# Patient Record
Sex: Female | Born: 1970 | Race: Black or African American | Hispanic: No | Marital: Single | Smoking: Never smoker
Health system: Southern US, Community
[De-identification: ages and names within clinical notes are randomized; demographics above are authoritative.]

## PROBLEM LIST (undated history)

## (undated) DIAGNOSIS — N920 Excessive and frequent menstruation with regular cycle: Secondary | ICD-10-CM

## (undated) DIAGNOSIS — F411 Generalized anxiety disorder: Secondary | ICD-10-CM

## (undated) DIAGNOSIS — R030 Elevated blood-pressure reading, without diagnosis of hypertension: Secondary | ICD-10-CM

## (undated) DIAGNOSIS — K219 Gastro-esophageal reflux disease without esophagitis: Secondary | ICD-10-CM

## (undated) DIAGNOSIS — Z86010 Personal history of colonic polyps: Secondary | ICD-10-CM

## (undated) DIAGNOSIS — R569 Unspecified convulsions: Secondary | ICD-10-CM

## (undated) DIAGNOSIS — N8000 Endometriosis of the uterus, unspecified: Secondary | ICD-10-CM

## (undated) DIAGNOSIS — N946 Dysmenorrhea, unspecified: Secondary | ICD-10-CM

## (undated) DIAGNOSIS — K589 Irritable bowel syndrome without diarrhea: Secondary | ICD-10-CM

## (undated) DIAGNOSIS — E785 Hyperlipidemia, unspecified: Secondary | ICD-10-CM

## (undated) DIAGNOSIS — J45909 Unspecified asthma, uncomplicated: Secondary | ICD-10-CM

## (undated) DIAGNOSIS — F329 Major depressive disorder, single episode, unspecified: Secondary | ICD-10-CM

## (undated) DIAGNOSIS — N8003 Adenomyosis of the uterus: Secondary | ICD-10-CM

## (undated) DIAGNOSIS — D649 Anemia, unspecified: Secondary | ICD-10-CM

## (undated) DIAGNOSIS — R480 Dyslexia and alexia: Secondary | ICD-10-CM

## (undated) DIAGNOSIS — G40909 Epilepsy, unspecified, not intractable, without status epilepticus: Secondary | ICD-10-CM

## (undated) DIAGNOSIS — R1032 Left lower quadrant pain: Secondary | ICD-10-CM

## (undated) DIAGNOSIS — Z860101 Personal history of adenomatous and serrated colon polyps: Secondary | ICD-10-CM

## (undated) DIAGNOSIS — J309 Allergic rhinitis, unspecified: Secondary | ICD-10-CM

## (undated) DIAGNOSIS — F909 Attention-deficit hyperactivity disorder, unspecified type: Secondary | ICD-10-CM

## (undated) HISTORY — DX: Generalized anxiety disorder: F41.1

## (undated) HISTORY — PX: COLONOSCOPY: SHX174

## (undated) HISTORY — DX: Left lower quadrant pain: R10.32

## (undated) HISTORY — DX: Unspecified convulsions: R56.9

## (undated) HISTORY — DX: Epilepsy, unspecified, not intractable, without status epilepticus: G40.909

## (undated) HISTORY — DX: Elevated blood-pressure reading, without diagnosis of hypertension: R03.0

## (undated) HISTORY — PX: TONSILLECTOMY: SUR1361

## (undated) HISTORY — DX: Anemia, unspecified: D64.9

## (undated) HISTORY — DX: Major depressive disorder, single episode, unspecified: F32.9

---

## 1999-08-12 ENCOUNTER — Encounter: Payer: Self-pay | Admitting: Emergency Medicine

## 1999-08-12 ENCOUNTER — Emergency Department (HOSPITAL_COMMUNITY): Admission: EM | Admit: 1999-08-12 | Discharge: 1999-08-12 | Payer: Self-pay | Admitting: Emergency Medicine

## 2004-08-05 ENCOUNTER — Other Ambulatory Visit: Admission: RE | Admit: 2004-08-05 | Discharge: 2004-08-05 | Payer: Self-pay | Admitting: Obstetrics and Gynecology

## 2004-10-01 ENCOUNTER — Ambulatory Visit (HOSPITAL_COMMUNITY): Admission: RE | Admit: 2004-10-01 | Discharge: 2004-10-01 | Payer: Self-pay | Admitting: Obstetrics and Gynecology

## 2004-11-26 ENCOUNTER — Inpatient Hospital Stay (HOSPITAL_COMMUNITY): Admission: AD | Admit: 2004-11-26 | Discharge: 2004-11-26 | Payer: Self-pay | Admitting: Obstetrics and Gynecology

## 2005-02-12 ENCOUNTER — Inpatient Hospital Stay (HOSPITAL_COMMUNITY): Admission: AD | Admit: 2005-02-12 | Discharge: 2005-02-12 | Payer: Self-pay | Admitting: Obstetrics and Gynecology

## 2005-02-22 ENCOUNTER — Encounter (INDEPENDENT_AMBULATORY_CARE_PROVIDER_SITE_OTHER): Payer: Self-pay | Admitting: Specialist

## 2005-02-22 ENCOUNTER — Inpatient Hospital Stay (HOSPITAL_COMMUNITY): Admission: RE | Admit: 2005-02-22 | Discharge: 2005-02-25 | Payer: Self-pay | Admitting: Obstetrics and Gynecology

## 2005-08-17 ENCOUNTER — Other Ambulatory Visit: Admission: RE | Admit: 2005-08-17 | Discharge: 2005-08-17 | Payer: Self-pay | Admitting: Obstetrics and Gynecology

## 2005-09-07 ENCOUNTER — Encounter: Admission: RE | Admit: 2005-09-07 | Discharge: 2005-09-07 | Payer: Self-pay | Admitting: Obstetrics and Gynecology

## 2008-02-07 ENCOUNTER — Ambulatory Visit: Payer: Self-pay | Admitting: Internal Medicine

## 2008-02-07 DIAGNOSIS — M549 Dorsalgia, unspecified: Secondary | ICD-10-CM | POA: Insufficient documentation

## 2008-02-07 DIAGNOSIS — E785 Hyperlipidemia, unspecified: Secondary | ICD-10-CM

## 2008-02-07 DIAGNOSIS — R079 Chest pain, unspecified: Secondary | ICD-10-CM

## 2008-02-07 DIAGNOSIS — J019 Acute sinusitis, unspecified: Secondary | ICD-10-CM | POA: Insufficient documentation

## 2008-02-11 ENCOUNTER — Emergency Department (HOSPITAL_COMMUNITY): Admission: EM | Admit: 2008-02-11 | Discharge: 2008-02-11 | Payer: Self-pay | Admitting: Emergency Medicine

## 2008-02-13 ENCOUNTER — Telehealth (INDEPENDENT_AMBULATORY_CARE_PROVIDER_SITE_OTHER): Payer: Self-pay | Admitting: *Deleted

## 2008-02-13 ENCOUNTER — Ambulatory Visit: Payer: Self-pay | Admitting: Internal Medicine

## 2008-02-13 DIAGNOSIS — R569 Unspecified convulsions: Secondary | ICD-10-CM

## 2008-02-15 ENCOUNTER — Telehealth: Payer: Self-pay | Admitting: Internal Medicine

## 2008-02-15 ENCOUNTER — Telehealth (INDEPENDENT_AMBULATORY_CARE_PROVIDER_SITE_OTHER): Payer: Self-pay | Admitting: *Deleted

## 2008-02-21 ENCOUNTER — Encounter: Payer: Self-pay | Admitting: Internal Medicine

## 2008-02-21 ENCOUNTER — Telehealth (INDEPENDENT_AMBULATORY_CARE_PROVIDER_SITE_OTHER): Payer: Self-pay | Admitting: *Deleted

## 2008-02-23 ENCOUNTER — Encounter: Admission: RE | Admit: 2008-02-23 | Discharge: 2008-02-23 | Payer: Self-pay | Admitting: Internal Medicine

## 2008-02-25 ENCOUNTER — Telehealth (INDEPENDENT_AMBULATORY_CARE_PROVIDER_SITE_OTHER): Payer: Self-pay | Admitting: *Deleted

## 2008-03-07 ENCOUNTER — Telehealth (INDEPENDENT_AMBULATORY_CARE_PROVIDER_SITE_OTHER): Payer: Self-pay | Admitting: *Deleted

## 2008-03-11 ENCOUNTER — Encounter: Payer: Self-pay | Admitting: Internal Medicine

## 2008-03-14 ENCOUNTER — Ambulatory Visit: Payer: Self-pay | Admitting: Internal Medicine

## 2008-03-14 DIAGNOSIS — F329 Major depressive disorder, single episode, unspecified: Secondary | ICD-10-CM

## 2008-03-14 DIAGNOSIS — R030 Elevated blood-pressure reading, without diagnosis of hypertension: Secondary | ICD-10-CM

## 2008-03-14 DIAGNOSIS — F419 Anxiety disorder, unspecified: Secondary | ICD-10-CM

## 2008-03-14 DIAGNOSIS — F909 Attention-deficit hyperactivity disorder, unspecified type: Secondary | ICD-10-CM

## 2008-04-14 ENCOUNTER — Telehealth (INDEPENDENT_AMBULATORY_CARE_PROVIDER_SITE_OTHER): Payer: Self-pay | Admitting: *Deleted

## 2008-04-18 ENCOUNTER — Ambulatory Visit: Payer: Self-pay | Admitting: Internal Medicine

## 2008-04-18 ENCOUNTER — Telehealth (INDEPENDENT_AMBULATORY_CARE_PROVIDER_SITE_OTHER): Payer: Self-pay | Admitting: *Deleted

## 2008-04-18 ENCOUNTER — Encounter: Payer: Self-pay | Admitting: Internal Medicine

## 2008-04-18 DIAGNOSIS — R509 Fever, unspecified: Secondary | ICD-10-CM

## 2008-04-18 LAB — CONVERTED CEMR LAB
ALT: 22 units/L (ref 0–35)
Alkaline Phosphatase: 47 units/L (ref 39–117)
Bilirubin, Direct: 0.2 mg/dL (ref 0.0–0.3)
CO2: 28 meq/L (ref 19–32)
Eosinophils Relative: 0 % (ref 0.0–5.0)
Glucose, Bld: 117 mg/dL — ABNORMAL HIGH (ref 70–99)
Hemoglobin: 12.9 g/dL (ref 12.0–15.0)
Lymphocytes Relative: 4.9 % — ABNORMAL LOW (ref 12.0–46.0)
Monocytes Relative: 7.1 % (ref 3.0–12.0)
Neutro Abs: 18 10*3/uL — ABNORMAL HIGH (ref 1.4–7.7)
Platelets: 297 10*3/uL (ref 150–400)
Potassium: 4 meq/L (ref 3.5–5.1)
RDW: 13 % (ref 11.5–14.6)
Sodium: 136 meq/L (ref 135–145)
Total Protein: 8.2 g/dL (ref 6.0–8.3)
WBC: 20.6 10*3/uL (ref 4.5–10.5)

## 2008-04-21 ENCOUNTER — Encounter: Payer: Self-pay | Admitting: Internal Medicine

## 2008-04-21 ENCOUNTER — Telehealth (INDEPENDENT_AMBULATORY_CARE_PROVIDER_SITE_OTHER): Payer: Self-pay | Admitting: *Deleted

## 2008-05-16 ENCOUNTER — Telehealth (INDEPENDENT_AMBULATORY_CARE_PROVIDER_SITE_OTHER): Payer: Self-pay | Admitting: *Deleted

## 2008-07-08 ENCOUNTER — Telehealth (INDEPENDENT_AMBULATORY_CARE_PROVIDER_SITE_OTHER): Payer: Self-pay | Admitting: *Deleted

## 2008-10-09 ENCOUNTER — Telehealth (INDEPENDENT_AMBULATORY_CARE_PROVIDER_SITE_OTHER): Payer: Self-pay | Admitting: *Deleted

## 2008-10-23 ENCOUNTER — Ambulatory Visit: Payer: Self-pay | Admitting: Internal Medicine

## 2008-12-16 ENCOUNTER — Ambulatory Visit: Payer: Self-pay | Admitting: Internal Medicine

## 2008-12-16 DIAGNOSIS — R062 Wheezing: Secondary | ICD-10-CM

## 2008-12-24 ENCOUNTER — Telehealth: Payer: Self-pay | Admitting: Internal Medicine

## 2009-03-18 ENCOUNTER — Emergency Department (HOSPITAL_BASED_OUTPATIENT_CLINIC_OR_DEPARTMENT_OTHER): Admission: EM | Admit: 2009-03-18 | Discharge: 2009-03-18 | Payer: Self-pay | Admitting: Emergency Medicine

## 2009-03-18 ENCOUNTER — Ambulatory Visit: Payer: Self-pay | Admitting: Diagnostic Radiology

## 2009-04-02 ENCOUNTER — Telehealth: Payer: Self-pay | Admitting: Internal Medicine

## 2009-04-27 ENCOUNTER — Encounter: Payer: Self-pay | Admitting: Internal Medicine

## 2009-04-27 ENCOUNTER — Telehealth: Payer: Self-pay | Admitting: Internal Medicine

## 2009-06-29 ENCOUNTER — Telehealth: Payer: Self-pay | Admitting: Internal Medicine

## 2009-08-19 ENCOUNTER — Telehealth: Payer: Self-pay | Admitting: Internal Medicine

## 2010-03-24 ENCOUNTER — Encounter (INDEPENDENT_AMBULATORY_CARE_PROVIDER_SITE_OTHER): Payer: Self-pay | Admitting: *Deleted

## 2010-04-27 ENCOUNTER — Telehealth: Payer: Self-pay | Admitting: Internal Medicine

## 2010-04-27 ENCOUNTER — Other Ambulatory Visit: Payer: Self-pay | Admitting: Physician Assistant

## 2010-04-27 ENCOUNTER — Ambulatory Visit
Admission: RE | Admit: 2010-04-27 | Discharge: 2010-04-27 | Payer: Self-pay | Source: Home / Self Care | Attending: Internal Medicine | Admitting: Internal Medicine

## 2010-04-27 DIAGNOSIS — R1032 Left lower quadrant pain: Secondary | ICD-10-CM | POA: Insufficient documentation

## 2010-04-27 DIAGNOSIS — R1012 Left upper quadrant pain: Secondary | ICD-10-CM | POA: Insufficient documentation

## 2010-04-27 DIAGNOSIS — K5900 Constipation, unspecified: Secondary | ICD-10-CM | POA: Insufficient documentation

## 2010-04-27 DIAGNOSIS — R1013 Epigastric pain: Secondary | ICD-10-CM | POA: Insufficient documentation

## 2010-04-27 LAB — COMPREHENSIVE METABOLIC PANEL
ALT: 13 U/L (ref 0–35)
AST: 15 U/L (ref 0–37)
Albumin: 4.3 g/dL (ref 3.5–5.2)
Alkaline Phosphatase: 35 U/L — ABNORMAL LOW (ref 39–117)
BUN: 10 mg/dL (ref 6–23)
Chloride: 104 mEq/L (ref 96–112)
Creatinine, Ser: 0.9 mg/dL (ref 0.4–1.2)
Potassium: 4.5 mEq/L (ref 3.5–5.1)

## 2010-04-27 LAB — LIPASE: Lipase: 27 U/L (ref 11.0–59.0)

## 2010-04-28 ENCOUNTER — Other Ambulatory Visit: Payer: Self-pay | Admitting: Internal Medicine

## 2010-04-28 ENCOUNTER — Ambulatory Visit
Admission: RE | Admit: 2010-04-28 | Discharge: 2010-04-28 | Payer: Self-pay | Source: Home / Self Care | Attending: Internal Medicine | Admitting: Internal Medicine

## 2010-04-28 LAB — HEPATIC FUNCTION PANEL
Albumin: 4 g/dL (ref 3.5–5.2)
Alkaline Phosphatase: 33 U/L — ABNORMAL LOW (ref 39–117)
Total Protein: 7 g/dL (ref 6.0–8.3)

## 2010-04-28 LAB — LIPID PANEL
Cholesterol: 179 mg/dL (ref 0–200)
Total CHOL/HDL Ratio: 2
Triglycerides: 57 mg/dL (ref 0.0–149.0)

## 2010-04-28 LAB — BASIC METABOLIC PANEL
CO2: 31 mEq/L (ref 19–32)
Chloride: 105 mEq/L (ref 96–112)
Glucose, Bld: 96 mg/dL (ref 70–99)
Sodium: 141 mEq/L (ref 135–145)

## 2010-04-28 LAB — URINALYSIS, ROUTINE W REFLEX MICROSCOPIC
Ketones, ur: NEGATIVE
Leukocytes, UA: NEGATIVE
Specific Gravity, Urine: >=1.03 (ref 1.000–1.030)
Total Protein, Urine: NEGATIVE
pH: 5.5 (ref 5.0–8.0)

## 2010-04-28 LAB — CBC WITH DIFFERENTIAL/PLATELET
Basophils Relative: 0.5 % (ref 0.0–3.0)
Eosinophils Absolute: 0.1 10*3/uL (ref 0.0–0.7)
Hemoglobin: 12.2 g/dL (ref 12.0–15.0)
Lymphs Abs: 1.7 10*3/uL (ref 0.7–4.0)
MCHC: 33.8 g/dL (ref 30.0–36.0)
MCV: 92.3 fl (ref 78.0–100.0)
Monocytes Absolute: 0.4 10*3/uL (ref 0.1–1.0)
Neutro Abs: 1.3 10*3/uL — ABNORMAL LOW (ref 1.4–7.7)
RBC: 3.92 Mil/uL (ref 3.87–5.11)
RDW: 13.9 % (ref 11.5–14.6)

## 2010-04-28 LAB — TSH: TSH: 1.14 u[IU]/mL (ref 0.35–5.50)

## 2010-04-29 ENCOUNTER — Encounter: Payer: Self-pay | Admitting: Internal Medicine

## 2010-04-29 ENCOUNTER — Ambulatory Visit
Admission: RE | Admit: 2010-04-29 | Discharge: 2010-04-29 | Payer: Self-pay | Source: Home / Self Care | Attending: Internal Medicine | Admitting: Internal Medicine

## 2010-04-29 ENCOUNTER — Ambulatory Visit: Payer: Self-pay | Admitting: Cardiology

## 2010-05-04 NOTE — Progress Notes (Signed)
Summary: Adderall PA  Phone Note From Pharmacy   Caller: [facility] [phone] Call For: ID:  [id number]  Case:  [id number]  Summary of Call: PA request--Amphetamine Salts. Case was avail. on [email or web address]/, requested fax forms. Initial call taken by: Lucious Groves,  April 27, 2009 8:33 AM  Follow-up for Phone Call        forms completed, awaiting signature. Follow-up by: Lucious Groves,  April 27, 2009 8:48 AM  Additional Follow-up for Phone Call Additional follow up Details #1::        done hardcopy to LIM side B - dahlia  Additional Follow-up by: Corwin Levins MD,  April 27, 2009 1:14 PM    Additional Follow-up for Phone Call Additional follow up Details #2::    was faxed yesterday, called for status and is approved until 04/27/2010. Follow-up by: Lucious Groves,  April 28, 2009 2:34 PM

## 2010-05-04 NOTE — Progress Notes (Signed)
Summary: rx refill  Phone Note Call from Patient Call back at [phone]   Summary of Call: Patient called for refill of Adderall. Patient would like 3 mo. at a time please.  She is aware that the office will call her when prescription is ready for pickup. Initial call taken by: Lucious Groves,  June 29, 2009 3:46 PM  Follow-up for Phone Call        done hardcopy to LIM side B - dahlia  \par Follow-up by: Corwin Levins MD,  June 29, 2009 5:39 PM  Additional Follow-up for Phone Call Additional follow up Details #1::        left message on voicemail to call back to office. Additional Follow-up by: Lucious Groves,  June 30, 2009 8:44 AM    Additional Follow-up for Phone Call Additional follow up Details #2::    patient notified. Follow-up by: Lucious Groves,  June 30, 2009 10:13 AM  New/Updated Medications: ADDERALL XR 30 MG XR24H-CAP (AMPHETAMINE-DEXTROAMPHETAMINE) 1 by mouth once daily - to fill Jun 29, 2009 ADDERALL XR 30 MG XR24H-CAP (AMPHETAMINE-DEXTROAMPHETAMINE) 1 by mouth once daily - to fill Jul 29, 2009 ADDERALL XR 30 MG XR24H-CAP (AMPHETAMINE-DEXTROAMPHETAMINE) 1 by mouth once daily - to fill Aug 28, 2009 Prescriptions: ADDERALL XR 30 MG XR24H-CAP (AMPHETAMINE-DEXTROAMPHETAMINE) 1 by mouth once daily - to fill Aug 28, 2009  #30 x 0   Entered and Authorized by:   Corwin Levins MD   Signed by:   Corwin Levins MD on 06/29/2009   Method used:   Print then Give to Patient   RxID:   [id number] ADDERALL XR 30 MG XR24H-CAP (AMPHETAMINE-DEXTROAMPHETAMINE) 1 by mouth once daily - to fill Jul 29, 2009  #30 x 0   Entered and Authorized by:   Corwin Levins MD   Signed by:   Corwin Levins MD on 06/29/2009   Method used:   Print then Give to Patient   RxID:   [id number] ADDERALL XR 30 MG XR24H-CAP (AMPHETAMINE-DEXTROAMPHETAMINE) 1 by mouth once daily - to fill Jun 29, 2009  #30 x 0   Entered and Authorized by:   Corwin Levins MD   Signed by:   Corwin Levins MD on 06/29/2009  Method used:   Print then Give to Patient   RxID:   [id number]

## 2010-05-04 NOTE — Progress Notes (Signed)
Summary: Rx change  Phone Note Call from Patient Call back at Home Phone [phone]   Caller: Patient Summary of Call: pt called stating that she would like to pick up Rx for Adderall. Rxs have been in cabinet up front. Pt was given a three month supply but will only be able to fill last Rx dated 08/28/2009. Pt is requesting 3 new prescriptions starting today. *Triage B has original Rxs* Initial call taken by: Margaret Pyle, CMA,  Aug 19, 2009 11:48 AM  Follow-up for Phone Call        done hardcopy to LIM side B - dahlia   Follow-up by: Corwin Levins MD,  Aug 19, 2009 12:27 PM  Additional Follow-up for Phone Call Additional follow up Details #1::        pt informed, Rx in cabinet for pt pick up, original Rx destroyed Additional Follow-up by: Margaret Pyle, CMA,  Aug 19, 2009 12:54 PM    New/Updated Medications: ADDERALL XR 30 MG XR24H-CAP (AMPHETAMINE-DEXTROAMPHETAMINE) 1 by mouth once daily - to fill Aug 19, 2009 ADDERALL XR 30 MG XR24H-CAP (AMPHETAMINE-DEXTROAMPHETAMINE) 1 by mouth once daily - to fill September 18, 2009 ADDERALL XR 30 MG XR24H-CAP (AMPHETAMINE-DEXTROAMPHETAMINE) 1 by mouth once daily - to fill October 18, 2009 Prescriptions: ADDERALL XR 30 MG XR24H-CAP (AMPHETAMINE-DEXTROAMPHETAMINE) 1 by mouth once daily - to fill October 18, 2009  #30 x 0   Entered and Authorized by:   Corwin Levins MD   Signed by:   Corwin Levins MD on 08/19/2009   Method used:   Print then Give to Patient   RxID:   [id number] ADDERALL XR 30 MG XR24H-CAP (AMPHETAMINE-DEXTROAMPHETAMINE) 1 by mouth once daily - to fill September 18, 2009  #30 x 0   Entered and Authorized by:   Corwin Levins MD   Signed by:   Corwin Levins MD on 08/19/2009   Method used:   Print then Give to Patient   RxID:   [id number] ADDERALL XR 30 MG XR24H-CAP (AMPHETAMINE-DEXTROAMPHETAMINE) 1 by mouth once daily - to fill Aug 19, 2009  #30 x 0   Entered and Authorized by:   Corwin Levins MD   Signed by:    Corwin Levins MD on 08/19/2009   Method used:   Print then Give to Patient   RxID:   [id number]

## 2010-05-04 NOTE — Medication Information (Signed)
Summary: Dextroamphetamine Approved/[facility]  Dextroamphetamine Approved/[facility]   Imported By: Sherian Rein 05/06/2009 10:17:58  _____________________________________________________________________  External Attachment:    Type:   Image     Comment:   External Document

## 2010-05-04 NOTE — Medication Information (Signed)
Summary: P Auth DEXTROAMPHETAMINE/[facility]  P Auth DEXTROAMPHETAMINE/[facility]   Imported By: Lester Juncal 05/01/2009 14:35:59  _____________________________________________________________________  External Attachment:    Type:   Image     Comment:   External Document

## 2010-05-05 ENCOUNTER — Telehealth: Payer: Self-pay | Admitting: Internal Medicine

## 2010-05-06 ENCOUNTER — Ambulatory Visit: Payer: Self-pay | Admitting: Internal Medicine

## 2010-05-06 ENCOUNTER — Ambulatory Visit (INDEPENDENT_AMBULATORY_CARE_PROVIDER_SITE_OTHER): Payer: BC Managed Care – PPO | Admitting: Gastroenterology

## 2010-05-06 ENCOUNTER — Encounter (INDEPENDENT_AMBULATORY_CARE_PROVIDER_SITE_OTHER): Payer: Self-pay | Admitting: *Deleted

## 2010-05-06 ENCOUNTER — Ambulatory Visit: Payer: BC Managed Care – PPO | Admitting: Gastroenterology

## 2010-05-06 ENCOUNTER — Encounter: Payer: Self-pay | Admitting: Gastroenterology

## 2010-05-06 DIAGNOSIS — K219 Gastro-esophageal reflux disease without esophagitis: Secondary | ICD-10-CM

## 2010-05-06 DIAGNOSIS — R1012 Left upper quadrant pain: Secondary | ICD-10-CM

## 2010-05-06 DIAGNOSIS — K5900 Constipation, unspecified: Secondary | ICD-10-CM

## 2010-05-06 NOTE — Letter (Signed)
Summary: New Patient letter  [facility]  [address] [place], [place] [address]   Phone: [phone]  Fax: [phone]       03/24/2010 MRN: [id number]  [facility] [address] [place], [place]  [address]  Dear Ms. Berthold,  Welcome to the Gastroenterology Division at [facility].    You are scheduled to see Dr.  Leone Payor on 05-06-10 at 1:45p.m. on the 3rd floor at [facility], [address]  We ask that you try to arrive at our office 15 minutes prior to your appointment time to allow for check-in.  We would like you to complete the enclosed self-administered evaluation form prior to your visit and bring it with you on the day of your appointment.  We will review it with you.  Also, please bring a complete list of all your medications or, if you prefer, bring the medication bottles and we will list them.  Please bring your insurance card so that we may make a copy of it.  If your insurance requires a referral to see a specialist, please bring your referral form from your primary care physician.  Co-payments are due at the time of your visit and may be paid by cash, check or credit card.     Your office visit will consist of a consult with your physician (includes a physical exam), any laboratory testing he/she may order, scheduling of any necessary diagnostic testing (e.g. x-ray, ultrasound, CT-scan), and scheduling of a procedure (e.g. Endoscopy, Colonoscopy) if required.  Please allow enough time on your schedule to allow for any/all of these possibilities.    If you cannot keep your appointment, please call [phone] to cancel or reschedule prior to your appointment date.  This allows [place] the opportunity to schedule an appointment for another patient in need of care.  If you do not cancel or reschedule by 5 p.m. the business day prior to your appointment date, you will be charged a $50.00 late cancellation/no-show fee.    Thank you for choosing  [facility] for your medical needs.  We appreciate the opportunity to care for you.  Please visit [place] at our website  to learn more about our practice.                     Sincerely,                                                             The Gastroenterology Division

## 2010-05-06 NOTE — Progress Notes (Signed)
Summary: triage  Phone Note Call from Patient Call back at [phone]   Summary of Call: Patient states that she's having severe stomach pain, can't wait until her appt 2-2 and offered her an appt for this Friday but she states that it hurts too much. Initial call taken by: Tawni Levy,  April 27, 2010 9:14 AM  Follow-up for Phone Call        patient will come in today and see Mike Gip PA at 2;30 Follow-up by: Darcey Nora RN, CGRN,  April 27, 2010 10:29 AM

## 2010-05-06 NOTE — Assessment & Plan Note (Signed)
Summary: cpx-lb   Vital Signs:  Patient profile:   40 year old female Height:      64 inches Weight:      203.50 pounds BMI:     35.06 O2 Sat:      99 % on Room air Temp:     98.5 degrees F oral Pulse rate:   73 / minute BP sitting:   112 / 82  (left arm) Cuff size:   large  Vitals Entered By: Zella Ball Ewing CMA (AAMA) (April 29, 2010 3:09 PM)  O2 Flow:  Room air  Preventive Care Screening     declines tetanus, sees GYN for mammogram, pap  CC: Adult Physical/RE   CC:  Adult Physical/RE.  History of Present Illness: here for wellness and f/u;  Pt denies CP, worsening sob, doe, wheezing, orthopnea, pnd, worsening LE edema, palps, dizziness or syncope  Pt denies new neuro symptoms such as headache, facial or extremity weakness  Pt denies polydipsia, polyuria  Overall good compliance with meds, trying to follow low chol diet, wt stable, little excercise however .  Overall good compliance with meds, and good tolerability.  No fever, wt loss, night sweats, loss of appetite or other constitutional symptoms  Pt states good ability with ADL's, low fall risk, home safety reviewed and adequate, no significant change in hearing or vision, trying to follow lower chol diet, and occasionally active only with regular excercise.   Denies worsening depressive symptoms, suicidal ideation, or panic  , but took herself off of sertarline due to suicidal thoughts, now resolved.  Does have ongoing depressive symptoms wit low mood, hard to lose wt, fatigue, anhedonia.  No further suicidal ideation.  Also adderall does help but just not enough, and still struggling to get through her classes with lack of concentraion, and unfort seems to be affecting her sleep, but also with increased depressive symptoms as well.   Still with ongoing epigastric discomfort, CT earlier today essentially neg.  Preventive Screening-Counseling & Management      Drug Use:  no.    Problems Prior to Update: 1)  Preventive Health  Care  (ICD-V70.0) 2)  Abdominal Pain, Left Lower Quadrant  (ICD-789.04) 3)  Constipation  (ICD-564.00) 4)  Luq Pain  (ICD-789.02) 5)  Abdominal Pain, Epigastric  (ICD-789.06) 6)  Wheezing  (ICD-786.07) 7)  Chest Pain  (ICD-786.50) 8)  Fever Unspecified  (ICD-780.60) 9)  Adhd  (ICD-314.01) 10)  Elevated Blood Pressure Without Diagnosis of Hypertension  (ICD-796.2) 11)  Depression  (ICD-311) 12)  Anxiety  (ICD-300.00) 13)  Seizure Disorder  (ICD-780.39) 14)  Back Pain  (ICD-724.5) 15)  Chest Pain  (ICD-786.50) 16)  Sinusitis- Acute-nos  (ICD-461.9) 17)  Hyperlipidemia  (ICD-272.4)  Medications Prior to Update: 1)  Adderall Xr 30 Mg Xr24h-Cap (Amphetamine-Dextroamphetamine) .Marland Kitchen.. 1 By Mouth Once Daily - To Fill October 18, 2009 2)  Nexium 40 Mg Cpdr (Esomeprazole Magnesium) .... Take 1 Capsule 30 Min Before Breakfast 3)  Align  Caps (Probiotic Product) .... Take 1 Capsule Daily  Current Medications (verified): 1)  Ritalin 10 Mg Tabs (Methylphenidate Hcl) .Marland Kitchen.. 1 By Mouth Three Times A Day 2)  Nexium 40 Mg Cpdr (Esomeprazole Magnesium) .... Take 1 Capsule 30 Min Before Breakfast 3)  Align  Caps (Probiotic Product) .... Take 1 Capsule Daily 4)  Lexapro 20 Mg Tabs (Escitalopram Oxalate) .... 1/2  By Mouth Once Daily  Allergies (verified): No Known Drug Allergies  Past History:  Past Medical History: Last updated: 04/27/2010 ?SEIZURE  DISORDER  ADHD Anxiety Depression learning disorder  Past Surgical History: Last updated: 02/07/2008 Caesarean section x 2- one with anemia and blood transfusion  Family History: Last updated: 02/07/2008 father with stroke x 5; gout, HTN, elev cholesterol grandfather woith ETOH grandmother with breast cancer, HTN mother and grandmother with asthma  Social History: Last updated: 04/29/2010 Single 3 children work - [employer] - [employer] Never Smoked Alcohol use-no Drug use-no  Risk Factors: Smoking Status: never (02/07/2008)  Social  History: 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 Smoked Alcohol use-no Drug use-no Drug Use:  no  Review of Systems  The patient denies anorexia, fever, vision loss, decreased hearing, hoarseness, chest pain, syncope, dyspnea on exertion, peripheral edema, prolonged cough, headaches, hemoptysis, abdominal pain, melena, hematochezia, severe indigestion/heartburn, hematuria, muscle weakness, suspicious skin lesions, transient blindness, difficulty walking, unusual weight change, abnormal bleeding, enlarged lymph nodes, and angioedema.         all otherwise negative per pt -    Physical Exam  General:  alert and overweight-appearing.  Head:  normocephalic and atraumatic.   Eyes:  vision grossly intact, pupils equal, and pupils round.   Ears:  R ear normal and L ear normal.   Nose:  no external deformity and no nasal discharge.   Mouth:  no gingival abnormalities and pharynx pink and moist.   Neck:  supple and no masses.   Lungs:  normal respiratory effort and normal breath sounds.   Heart:  normal rate and regular rhythm.   Abdomen:  soft, non-tender, and normal bowel sounds.   Msk:  no joint tenderness and no joint swelling.   Extremities:  no edema, no erythema  Neurologic:  strength normal in all extremities and gait normal.   Skin:  color normal and no rashes.   Psych:  depressed affect and moderately anxious.     Impression & Recommendations:  Problem # 1:  Preventive Health Care (ICD-V70.0) Overall doing well, age appropriate education and counseling updated, referral for preventive services and immunizations addressed, dietary counseling and smoking status adressed , most recent labs reviewed, ecg reviewed I have personally reviewed and have noted 1.The patient's medical and social history 2.Their use of alcohol, tobacco or illicit drugs 3.Their current medications and supplements 4. Functional ability including ADL's, fall risk, home safety risk, hearing &  visual impairment  5.Diet and physical activities 6.Evidence for depression or mood disorders The patients weight, height, BMI  have been recorded in the chart I have made referrals, counseling and provided education to the patient based review of the above  Orders: EKG w/ Interpretation (93000)  Problem # 2:  ABDOMINAL PAIN, EPIGASTRIC (ICD-789.06) d/w pt - CT neg today - for GI f/u as planned, and cont the nexium - just started nexium yesterday  Problem # 3:  DEPRESSION (ICD-311)  will ask pt to try lexapro 10 mg per day (half of the 20 mg)  Her updated medication list for this problem includes:    Lexapro 20 Mg Tabs (Escitalopram oxalate) .Marland Kitchen... 1/2  by mouth once daily  Discussed treatment options, including trial of antidpressant medication. Patient agrees to call if any worsening of symptoms or thoughts of doing harm arise. Verified that the patient has no suicidal ideation at this time.   Problem # 4:  ADHD (ICD-314.01) ok to change to ritallin 10 three times a day due to her class schedule and tyring not to affect her sleep  Complete Medication List: 1)  Ritalin  10 Mg Tabs (Methylphenidate hcl) .Marland Kitchen.. 1 by mouth three times a day 2)  Nexium 40 Mg Cpdr (Esomeprazole magnesium) .... Take 1 capsule 30 min before breakfast 3)  Align Caps (Probiotic product) .... Take 1 capsule daily 4)  Lexapro 20 Mg Tabs (Escitalopram oxalate) .... 1/2  by mouth once daily  Patient Instructions: 1)  Please take all new medications as prescribed - remember the lexapro is only HALF pill per day; and the ritalin as prescribed 2)  stop the adderall 3)  Continue all previous medications as before this visit 4)  Please followup with GI as you have planned 5)  Please schedule a follow-up appointment in 1 year, or sooner if needed Prescriptions: RITALIN 10 MG TABS (METHYLPHENIDATE HCL) 1 by mouth three times a day  #90 x 0   Entered and Authorized by:   Corwin Levins MD   Signed by:   Corwin Levins MD  on 04/29/2010   Method used:   Print then Give to Patient   RxID:   [id number] LEXAPRO 20 MG TABS (ESCITALOPRAM OXALATE) 1 by mouth once daily  #90 x 3   Entered and Authorized by:   Corwin Levins MD   Signed by:   Corwin Levins MD on 04/29/2010   Method used:   Print then Give to Patient   RxID:   [id number] LEXAPRO 20 MG TABS (ESCITALOPRAM OXALATE) 1 by mouth once daily  #90 x 3   Entered and Authorized by:   Corwin Levins MD   Signed by:   Corwin Levins MD on 04/29/2010   Method used:   Print then Give to Patient   RxID:   [id number]    Orders Added: 1)  EKG w/ Interpretation [93000] 2)  Est. Patient 40-64 years [87564]

## 2010-05-06 NOTE — Assessment & Plan Note (Addendum)
Summary: upper gastric pain--ch.   History of Present Illness Primary GI MD: Sheryn Bison MD FACP FAGA History of Present Illness:   PLEASANT 40 YO FEMALE WITH HX OF ADD,ANXIETY. SHE COMES IN WITH C/O UPPER ABDOMINAL PAIN WHICH HAS BEEN BOTHERING HER OVER THE PAST COUPLE MONTHS. SHE SAYS SHE HAS HAD SOME  SIMILAR SXS OFF AND ON OVER THE PAST 4-5 YEARS,WORSE THESE PAST FEW MONTHS. THE PAIN IS INTERMITTENT AND DOES NOT SEEM TO HAVE A PATTERN,NOR IS IT NECCESSARILY RELATED TO MEALS. SHE DESCRIBES IT AS  A SORE,PRESSURE TYPE FEELINGTHAT DOES NOT RADIATE. HER APPETITE IS FAIR ON ADDERALL, NO WEIGHT LOSS. +OCCASIONAL QUESINESS, NO VOMITING. SHE ALSO DOES HAVE SOME PROBLEMS WITH CONSTIPATION,USUALLY HAS A BM 2-3 X PER WEEK, NO MELENA OR HEME.  NO ASA/NSAID USE,NO ETOH. SHE WAS SEEN AT URGENT CARE IN DECEMBER-CBC WAS NORMAL. SHE WAS ASKED TO TYR  PRILOSEC-TRIED IT as needed WITH PAIN AND IT DIDN'T HELP.   GI Review of Systems    Reports abdominal pain, belching, bloating, and  heartburn.     Location of  Abdominal pain: LUQ.    Denies acid reflux, chest pain, dysphagia with liquids, dysphagia with solids, loss of appetite, nausea, vomiting, vomiting blood, weight loss, and  weight gain.      Reports constipation.     Denies anal fissure, black tarry stools, change in bowel habit, diarrhea, diverticulosis, fecal incontinence, heme positive stool, hemorrhoids, irritable bowel syndrome, jaundice, light color stool, liver problems, rectal bleeding, and  rectal pain.    Current Medications (verified): 1)  Adderall Xr 30 Mg Xr24h-Cap (Amphetamine-Dextroamphetamine) .Marland Kitchen.. 1 By Mouth Once Daily - To Fill October 18, 2009  Allergies: No Known Drug Allergies  Past History:  Past Medical History: ?SEIZURE DISORDER  ADHD Anxiety Depression learning disorder  Past Surgical History: Reviewed history from 02/07/2008 and no changes required. Caesarean section x 2- one with anemia and blood  transfusion  Family History: Reviewed history from 02/07/2008 and no changes required. father with stroke x 5; gout, HTN, elev cholesterol grandfather woith ETOH grandmother with breast cancer, HTN mother and grandmother with asthma  Social History: Reviewed history from 02/07/2008 and no changes required. 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 Smoked Alcohol use-no  Review of Systems  The patient denies abdominal pain, allergy/sinus, anemia, anxiety-new, arthritis/joint pain, back pain, blood in urine, breast changes/lumps, change in vision, confusion, cough, coughing up blood, depression-new, fainting, fatigue, fever, headaches-new, hearing problems, heart murmur, heart rhythm changes, itching, menstrual pain, muscle pains/cramps, night sweats, nosebleeds, pregnancy symptoms, shortness of breath, skin rash, sleeping problems, sore throat, swelling of feet/legs, swollen lymph glands, thirst - excessive, urination - excessive, and urination changes/pain.         SEE HPI  Vital Signs:  Patient profile:   40 year old female Height:      64 inches Weight:      193 pounds BMI:     33.25 Pulse rate:   68 / minute Pulse rhythm:   regular BP sitting:   120 / 70  (left arm)  Vitals Entered By: Lowry Ram NCMA (April 27, 2010 2:49 PM)  Physical Exam  General:  Well developed, well nourished, no acute distress. Head:  Normocephalic and atraumatic. Eyes:  PERRLA, no icterus. Lungs:  Clear throughout to auscultation. Heart:  Regular rate and rhythm; no murmurs, rubs,  or bruits. Abdomen:  SOFT, MINIMALLY TENER EPIGASTRIUM/LUQ/LMQ, NO GUARDING, NO REBOUND, NO MASS OR HSM,BS+ Rectal:  NOT DONE- HEMCULT NEGATIVE PER URGENT CARE Extremities:  No clubbing, cyanosis, edema or deformities noted. Neurologic:  Alert and  oriented x4;  grossly normal neurologically. Psych:  Alert and cooperative. Normal mood and affect.   Impression & Recommendations:  Problem # 1:   ABDOMINAL PAIN, LEFT LOWER QUADRANT (ICD-789.04) Assessment Deteriorated 40 YO FEMALE WITH EPIGASTRIC/LUQ ABDOMINAL PAIN INTERMITTENT,X 3 MONTHS, SOME SIMILAR SXS INTERMITTENT X 5 YEARS. 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 R/O GASTRITIS/PUD  LABS AS BELOW SCHEDULE FOR CT ABD/PELVIS WITH CONTRAST START TRIAL OF NEXIUM 40 MG Q AM X 2-3 WEEKS-SAMPLES GIVEN  ROV IN 2 WEEKS WITH  Amador Braddy OR DR. PATTERSON ,CONSIDER EGD DEPENDING ON RESULTS OF ABOVE. Orders: CT Abdomen/Pelvis with Contrast (CT Abd/Pelvis w/con)  Problem # 2:  ADHD (ICD-314.01) Assessment: Comment Only  Other Orders: TLB-CMP (Comprehensive Metabolic Pnl) (80053-COMP) TLB-CRP-High Sensitivity (C-Reactive Protein) (86140-FCRP) TLB-Lipase (83690-LIPASE)  Patient Instructions: 1)  Please go to lab, basement level. 2)  We scheduled the CT scan at [facility] CT. 3)  Directions and contrast provided. 4)  We will call you with the labs and the CT  scan results. 5)  Copy sent to : Dr. Oliver Barre 6)  The medication list was reviewed and reconciled.  All changed / newly prescribed medications were explained.  A complete medication list was provided to the patient / caregiver.

## 2010-05-10 ENCOUNTER — Encounter (AMBULATORY_SURGERY_CENTER): Payer: BC Managed Care – PPO | Admitting: Gastroenterology

## 2010-05-10 ENCOUNTER — Encounter: Payer: Self-pay | Admitting: Gastroenterology

## 2010-05-10 ENCOUNTER — Other Ambulatory Visit: Payer: Self-pay | Admitting: Gastroenterology

## 2010-05-10 DIAGNOSIS — K5900 Constipation, unspecified: Secondary | ICD-10-CM

## 2010-05-10 DIAGNOSIS — K219 Gastro-esophageal reflux disease without esophagitis: Secondary | ICD-10-CM

## 2010-05-10 DIAGNOSIS — R1012 Left upper quadrant pain: Secondary | ICD-10-CM

## 2010-05-10 DIAGNOSIS — D126 Benign neoplasm of colon, unspecified: Secondary | ICD-10-CM

## 2010-05-11 ENCOUNTER — Telehealth (INDEPENDENT_AMBULATORY_CARE_PROVIDER_SITE_OTHER): Payer: Self-pay | Admitting: *Deleted

## 2010-05-12 ENCOUNTER — Encounter: Payer: Self-pay | Admitting: Gastroenterology

## 2010-05-12 NOTE — Letter (Signed)
Summary: [facility] Instructions  [facility]  [address] [place], [place] [address]   Phone: [phone]  Fax: [phone]       Kellie Curtis    [birth year]    MRN: [id number]        Procedure Day /Date: Monday 05/10/2010     Arrival Time: 1:30pm     Procedure Time: 2:30pm     Location of Procedure:                    X  [facility] (4th Floor)  PREPARATION FOR COLONOSCOPY WITH MOVIPREP   Starting 5 days prior to your procedure TODAY do not eat nuts, seeds, popcorn, corn, beans, peas,  salads, or any raw vegetables.  Do not take any fiber supplements (e.g. Metamucil, Citrucel, and Benefiber).  THE DAY BEFORE YOUR PROCEDURE         Sunday 05/09/2010  1.  Drink clear liquids the entire day-NO SOLID FOOD  2.  Do not drink anything colored red or purple.  Avoid juices with pulp.  No orange juice.  3.  Drink at least 64 oz. (8 glasses) of fluid/clear liquids during the day to prevent dehydration and help the prep work efficiently.  CLEAR LIQUIDS INCLUDE: Water Jello Ice Popsicles Tea (sugar ok, no milk/cream) Powdered fruit flavored drinks Coffee (sugar ok, no milk/cream) Gatorade Juice: apple, white grape, white cranberry  Lemonade Clear bullion, consomm, broth Carbonated beverages (any kind) Strained chicken noodle soup Hard Candy                             4.  In the morning, mix first dose of MoviPrep solution:    Empty 1 Pouch A and 1 Pouch B into the disposable container    Add lukewarm drinking water to the top line of the container. Mix to dissolve    Refrigerate (mixed solution should be used within 24 hrs)  5.  Begin drinking the prep at 5:00 p.m. The MoviPrep container is divided by 4 marks.   Every 15 minutes drink the solution down to the next mark (approximately 8 oz) until the full liter is complete.   6.  Follow completed prep with 16 oz of clear liquid of your choice (Nothing red or purple).  Continue to drink clear  liquids until bedtime.  7.  Before going to bed, mix second dose of MoviPrep solution:    Empty 1 Pouch A and 1 Pouch B into the disposable container    Add lukewarm drinking water to the top line of the container. Mix to dissolve    Refrigerate  THE DAY OF YOUR PROCEDURE      Monday 05/10/2010  Beginning at 9:30am (5 hours before procedure):         1. Every 15 minutes, drink the solution down to the next mark (approx 8 oz) until the full liter is complete.  2. Follow completed prep with 16 oz. of clear liquid of your choice.    3. You may drink clear liquids until 12:30pm (2 HOURS BEFORE PROCEDURE).   MEDICATION INSTRUCTIONS  Unless otherwise instructed, you should take regular prescription medications with a small sip of water   as early as possible the morning of your procedure.          OTHER INSTRUCTIONS  You will need a responsible adult at least 42 years of age to accompany you and drive you  home.   This person must remain in the waiting room during your procedure.  Wear loose fitting clothing that is easily removed.  Leave jewelry and other valuables at home.  However, you may wish to bring a book to read or  an iPod/MP3 player to listen to music as you wait for your procedure to start.  Remove all body piercing jewelry and leave at home.  Total time from sign-in until discharge is approximately 2-3 hours.  You should go home directly after your procedure and rest.  You can resume normal activities the  day after your procedure.  The day of your procedure you should not:   Drive   Make legal decisions   Operate machinery   Drink alcohol   Return to work  You will receive specific instructions about eating, activities and medications before you leave.    The above instructions have been reviewed and explained to me by   _______________________    I fully understand and can verbalize these instructions _____________________________ Date  _________

## 2010-05-12 NOTE — Assessment & Plan Note (Signed)
Summary: F/U Abdominal Pain , saw Amy Esterwood PA   Vital Signs:  Patient profile:   40 year old female Height:      64 inches Weight:      196.13 pounds BMI:     33.79 Pulse rate:   68 / minute Pulse rhythm:   regular BP sitting:   110 / 62  (left arm) Cuff size:   regular  Vitals Entered By: June McMurray CMA Duncan Dull) (May 06, 2010 10:33 AM)  Current Medications (verified): 1)  Nexium 40 Mg Cpdr (Esomeprazole Magnesium) .... Take 1 Capsule 30 Min Before Breakfast 2)  Align  Caps (Probiotic Product) .... Take 1 Capsule Daily 3)  Citalopram Hydrobromide 20 Mg Tabs (Citalopram Hydrobromide) .Marland Kitchen.. 1 By Mouth Once Daily  Allergies (verified): No Known Drug Allergies  History of Present Illness Visit Type: Follow-up Visit Primary GI MD: Sheryn Bison MD FACP FAGA Primary Provider: Oliver Barre, MD Chief Complaint: abdominal pain, bloating and some constipation.  History of Present Illness:   Very Pleasant 40 year old African American female who's had several years of recurrent left upper quadrant pain radiating into her back with associated severe constipation with bowel movement approximately once a week. Also, she has had rather severe GERD with good response to recent initiation of Nexium 40 mg a day. She was seen by her physician's assistant several weeks ago and had CT scan of the abdomen and pelvis which was unremarkable except for small umbilical hernia with mesenteric fat, a small amount of pelvic fluid, but no evidence of cholelithiasis, pancreatic masses, or other abnormalities. Labs were all unremarkable including liver profile, amylase, lipase, but she did have a mild leukopenia.  She has constant left upper quadrant pain with occasional" spells" of severe pain that required her to have recent urgent care visit. She currently is on the line probiotic therapy and citalopram 20 mg a day. She has not been on recent antibiotics, and denies any cardiopulmonary, genitourinary, or  gynecologic problems at this time. She is status post tubal ligation. Her appetite is good her weight has been stable, and she denies any specific food intolerances. Her GERD consisted mostly of her agitation and burning substernal chest pain no dysphagia. She has not had previous barium studies or endoscopic exams. Family history is noncontributory.   GI Review of Systems    Reports abdominal pain and  bloating.      Denies acid reflux, belching, chest pain, dysphagia with liquids, dysphagia with solids, heartburn, loss of appetite, nausea, vomiting, vomiting blood, weight loss, and  weight gain.      Reports constipation.     Denies anal fissure, black tarry stools, change in bowel habit, diarrhea, diverticulosis, fecal incontinence, heme positive stool, hemorrhoids, irritable bowel syndrome, jaundice, light color stool, liver problems, rectal bleeding, and  rectal pain.  Past History:  Past medical, surgical, family and social histories (including risk factors) reviewed for relevance to current acute and chronic problems.  Past Medical History: Reviewed history from 04/27/2010 and no changes required. ?SEIZURE DISORDER  ADHD Anxiety Depression learning disorder  Past Surgical History: Reviewed history from 02/07/2008 and no changes required. Caesarean section x 2- one with anemia and blood transfusion  Family History: Reviewed history from 02/07/2008 and no changes required. father with stroke x 5; gout, HTN, elev cholesterol grandfather woith ETOH grandmother with breast cancer, HTN mother and grandmother with asthma  Social History: Reviewed history from 04/29/2010 and no changes required. 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, theatre/television/film -  [employer] Never Smoked Alcohol use-no Drug use-no  Review of Systems GI:  Complains of indigestion/heartburn, abdominal pain, and constipation; denies difficulty swallowing, pain on swallowing, nausea, vomiting, vomiting blood, jaundice,  gas/bloating, diarrhea, change in bowel habits, bloody BM's, black BMs, and fecal incontinence.  Physical Exam  General:  Well developed, well nourished, no acute distress.healthy appearing.  healthy appearing.   Head:  Normocephalic and atraumatic. Eyes:  PERRLA, no icterus.exam deferred to patient's ophthalmologist.  exam deferred to patient's ophthalmologist.   Lungs:  Clear throughout to auscultation. Heart:  Regular rate and rhythm; no murmurs, rubs,  or bruits. Abdomen:  Soft, nontender and nondistended. No masses, hepatosplenomegaly or hernias noted. Normal bowel sounds. Rectal:  deferred until time of colonoscopy.  deferred until time of colonoscopy.   Extremities:  No clubbing, cyanosis, edema or deformities noted. Neurologic:  Alert and  oriented x4;  grossly normal neurologically. Psych:  Alert and cooperative. Normal mood and affect.   Impression & Recommendations:  Problem # 1:  GERD (ICD-530.81) Assessment Improved Continue Nexium 40 mg a day. Endoscopic exam with small bowel biopsy and exam for H. pylori has been scheduled. Orders: Colon/Endo (Colon/Endo)  Problem # 2:  ABDOMINAL PAIN-LUQ (ICD-789.02) Assessment: Improved Atypical pain of unexplained etiology. It is hard to believe that a small umbilical hernia could cause this much difficulty. We have scheduled endoscopic exam, and I prescribed p.r.n. tramadol. If her workup is negative we will do gallbladder ultrasonography to be sure she does not have cholelithiasis. I think a lot of her difficulty is related to her severe constipation with probable gas trapping in her splenic flexure area.Amitiza 8 micrograms twice a day samples have been supplied. Orders: Colon/Endo (Colon/Endo)  Problem # 3:  CONSTIPATION (ICD-564.00) Assessment: Unchanged  Orders: Colon/Endo (Colon/Endo)  Problem # 4:  ELEVATED BLOOD PRESSURE WITHOUT DIAGNOSIS OF HYPERTENSION (ICD-796.2) Assessment: Improved blood pressure today 110/62 and  pulse was 68 and regular.  Problem # 5:  ADHD (ICD-314.01) Assessment: Improved Continue Citalopram 20 mg a day per primary care.  Patient Instructions: 1)  Copy sent to : Oliver Barre, MD 2)  Your procedure has been scheduled for 05/10/2010, please follow the seperate instructions.  3)  [facility] Patient Information Guide given to patient.  4)  Colonoscopy and Flexible Sigmoidoscopy brochure given.  5)  Upper Endoscopy brochure given.  6)  Your prescription(s) have been sent to you pharmacy.  7)  The medication list was reviewed and reconciled.  All changed / newly prescribed medications were explained.  A complete medication list was provided to the patient / caregiver. Prescriptions: TRAMADOL HCL 50 MG TABS (TRAMADOL HCL) take one by mouth two times a day  #60 x 0   Entered by:   Harlow Mares CMA (AAMA)   Authorized by:   Mardella Layman MD [facility]   Signed by:   Mardella Layman MD [facility] on 05/06/2010   Method used:   Electronically to        [facility] # [phone]* (retail)       [address] [place], [place]  [address]       Ph: [phone]       Fax: [phone]   RxID:   [id number] AMITIZA 8 MCG CAPS (LUBIPROSTONE) take one by mouth two times a day  #60 x 3   Entered by:   Harlow Mares CMA (AAMA)   Authorized by:   Mardella Layman MD  FACG   Signed by:   Mardella Layman MD FACG on 05/06/2010   Method used:   Electronically to        [facility] # [phone]* (retail)       [address] [place], [place]  [address]       Ph: [phone]       Fax: [phone]   RxID:   [id number] MOVIPREP 100 GM  SOLR (PEG-KCL-NACL-NASULF-NA ASC-C) As per prep instructions.  #1 x 0   Entered by:   Harlow Mares CMA (AAMA)   Authorized by:   Mardella Layman MD [facility]   Signed by:   Mardella Layman MD [facility] on 05/06/2010   Method used:   Electronically to        [facility] # [phone]* (retail)       [address] [place], [place]  [address]       Ph: [phone]       Fax: [phone]   RxID:   [id number]    Orders Added: 1)  Colon/Endo [Colon/Endo]

## 2010-05-12 NOTE — Letter (Signed)
Summary: Out of Work  [facility]  [address] [place], [place] [address]   Phone: [phone]  Fax: [phone]    May 06, 2010   Employee:  DYANNA SEITER    To Whom It May Concern:   For Medical reasons, please excuse the above named employee from work for the following dates:  Start:   05/10/2010  She May return to work on 05/11/2010  If you need additional information, please feel free to contact our office.         Sincerely,    Harlow Mares CMA (AAMA)

## 2010-05-12 NOTE — Progress Notes (Signed)
Summary: ALT med  Phone Note Call from Patient   Caller: Patient [phone] OK Reason for Call: Refill Medication Summary of Call: Pt called stating that Lexapro and Ritalin are too expensive. Pt is requesting generic of Lexapro alternative to [facility] and Ritalin alternative to pick up tomorrow 05/06/2010 Initial call taken by: Margaret Pyle, CMA,  May 05, 2010 4:09 PM  Follow-up for Phone Call        I'm not sure what to say about the ritalin since it is generic; please have pt (or [place]) ask pharmacist if there a less expensive alternative that is covered by her insurance  (her insurance sounds like it may not cover any, even the generic stimulants)  the lexapro can be change to citalopram 20 mg - 1 per day - done per EMR Follow-up by: Corwin Levins MD,  May 05, 2010 4:25 PM  Additional Follow-up for Phone Call Additional follow up Details #1::        left message on machine for pt to return my call. Margaret Pyle, CMA  May 05, 2010 4:38 PM  Pt will call back with names of alernatives provided by [employer]. Margaret Pyle, CMA  May 06, 2010 9:16 AM   Per pt Ritalin Rx need to be written for generic only, Methylphenidate Additional Follow-up by: Margaret Pyle, CMA,  May 06, 2010 9:45 AM    Additional Follow-up for Phone Call Additional follow up Details #2::    please verify with pharmacy,  does the pharmacy really require a generic name on the rx since the rx clearly indicates generic is OK? Follow-up by: Corwin Levins MD,  May 06, 2010 10:02 AM  Additional Follow-up for Phone Call Additional follow up Details #3:: Details for Additional Follow-up Action Taken: Per pt her Insurance company is requiring it.  Pt will return original rx.  Margaret Pyle, CMA  May 06, 2010 10:08 AM    Patient returned and received prescription she requested. Additional Follow-up by: Robin Ewing CMA (AAMA),   May 06, 2010 11:50 AM  New/Updated Medications: CITALOPRAM HYDROBROMIDE 20 MG TABS (CITALOPRAM HYDROBROMIDE) 1 by mouth once daily Prescriptions: CITALOPRAM HYDROBROMIDE 20 MG TABS (CITALOPRAM HYDROBROMIDE) 1 by mouth once daily  #90 x 3   Entered and Authorized by:   Corwin Levins MD   Signed by:   Corwin Levins MD on 05/05/2010   Method used:   Electronically to        [facility] # [phone]* (retail)       [address] [place], [place]  [address]       Ph: [phone]       Fax: [phone]   RxID:   [id number]    This is highly unlikely,  but I will re-do the rx when pt turns in the first  Corwin Levins MD  May 06, 2010 10:39 AM

## 2010-05-14 ENCOUNTER — Encounter: Payer: Self-pay | Admitting: Gastroenterology

## 2010-05-20 ENCOUNTER — Telehealth (INDEPENDENT_AMBULATORY_CARE_PROVIDER_SITE_OTHER): Payer: Self-pay | Admitting: *Deleted

## 2010-05-20 ENCOUNTER — Encounter: Payer: Self-pay | Admitting: Internal Medicine

## 2010-05-20 NOTE — Miscellaneous (Signed)
Summary: Clotest  Clinical Lists Changes  Orders: Added new Test order of TLB-H Pylori Screen Gastric Biopsy (83013-CLOTEST) - Signed 

## 2010-05-20 NOTE — Procedures (Addendum)
Summary: Endoscopy   EGD  Procedure date:  05/10/2010  Findings:      Location: [facility]   ENDOSCOPY PROCEDURE REPORT  PATIENT:  Kellie Curtis, Kellie Curtis  MR#:  [id number] BIRTHDATE:   04-05-1970, 39 yrs. old   GENDER:   female  ENDOSCOPIST:   Vania Rea. Jarold Motto, MD, [facility] Referred by:   PROCEDURE DATE:  05/10/2010 PROCEDURE:  EGD with biopsy, 9[address]78 ASA CLASS:   Class II INDICATIONS: GERD, abdominal pain, left upper quadr   MEDICATIONS:    There was residual sedation effect present from prior procedure., Fentanyl 50 mcg IV, Versed 3 mg IV TOPICAL ANESTHETIC:   Exactacain Spray  DESCRIPTION OF PROCEDURE:   After the risks benefits and alternatives of the procedure were thoroughly explained, informed consent was obtained.  The LB GIF-H180 [id number] endoscope was introduced through the mouth and advanced to the second portion of the duodenum, without limitations.  The instrument was slowly withdrawn as the mucosa was fully examined. <<PROCEDUREIMAGES>>    <<OLD IMAGES>>  A hiatal hernia was found. 4-5 CM. HH NOTED.NO STRICTURE OR INFLAMMATION NOTED.  Normal duodenal folds were noted.  The esophagus and gastroesophageal junction were completely normal in appearance.  The stomach was entered and closely examined. The antrum, angularis, and lesser curvature were well visualized, including a retroflexed view of the cardia and fundus. The stomach wall was normally distensable. The scope passed easily through the pylorus into the duodenum. CLO BX. DONE.    Retroflexed views revealed no abnormalities.    The scope was then withdrawn from the patient and the procedure completed.  COMPLICATIONS:   None  ENDOSCOPIC IMPRESSION:  1) Hiatal hernia  2) Normal duodenal folds  3) Normal esophagus  4) Normal stomach  PROBABLE CHRONIC GERD,R/O H.PYLORI. RECOMMENDATIONS:  1) continue PPI  REPEAT EXAM:   No   _______________________________ Vania Rea. Jarold Motto, MD,  [facility]    CC: Corwin Levins, MD

## 2010-05-20 NOTE — Progress Notes (Signed)
Summary: f/u endo/colon  ---- Converted from flag ---- ---- 05/11/2010 2:07 PM, Graciella Freer RN wrote:   ---- 05/11/2010 8:10 AM, Harlow Mares CMA (AAMA) wrote:   ---- 05/10/2010 3:36 PM, Karl Bales RN wrote: This pt needs a follow up appt for 2-3 weeks.   Thanks, Baxter Hire ------------------------------       Additional Follow-up for Phone Call Additional follow up Details #2::    I had Lmom for patient informing her of need of f/u appointment. Patient lmom stating she would like 05/27/10, early if possible; Lmom for patient I scheduled her for 0845am on 05/27/10. Graciella Freer RN  May 11, 2010 3:27 PM   Changed patient's appointment per her request to 05/25/10 @ 1545 Follow-up by: Graciella Freer RN,  May 12, 2010 10:12 AM

## 2010-05-21 ENCOUNTER — Encounter: Payer: Self-pay | Admitting: Gastroenterology

## 2010-05-21 DIAGNOSIS — Z860100 Personal history of colon polyps, unspecified: Secondary | ICD-10-CM | POA: Insufficient documentation

## 2010-05-21 DIAGNOSIS — K449 Diaphragmatic hernia without obstruction or gangrene: Secondary | ICD-10-CM | POA: Insufficient documentation

## 2010-05-25 ENCOUNTER — Other Ambulatory Visit: Payer: BC Managed Care – PPO

## 2010-05-25 ENCOUNTER — Encounter: Payer: Self-pay | Admitting: Gastroenterology

## 2010-05-25 ENCOUNTER — Encounter (INDEPENDENT_AMBULATORY_CARE_PROVIDER_SITE_OTHER): Payer: Self-pay | Admitting: *Deleted

## 2010-05-25 ENCOUNTER — Other Ambulatory Visit: Payer: Self-pay | Admitting: Gastroenterology

## 2010-05-25 ENCOUNTER — Ambulatory Visit (INDEPENDENT_AMBULATORY_CARE_PROVIDER_SITE_OTHER): Payer: BC Managed Care – PPO | Admitting: Gastroenterology

## 2010-05-25 DIAGNOSIS — K219 Gastro-esophageal reflux disease without esophagitis: Secondary | ICD-10-CM | POA: Insufficient documentation

## 2010-05-25 DIAGNOSIS — K5900 Constipation, unspecified: Secondary | ICD-10-CM

## 2010-05-26 LAB — FERRITIN: Ferritin: 15.2 ng/mL (ref 10.0–291.0)

## 2010-05-26 LAB — FOLATE: Folate: 15.7 ng/mL (ref >5.9)

## 2010-05-26 NOTE — Letter (Signed)
Summary: Patient [name] Biopsy Results  [facility]  [address] [place], [place] [address]   Phone: [phone]  Fax: [phone]        May 12, 2010 MRN: [id number]    [facility] [address] [place], [place]  [address]    Dear Kellie Curtis,  I am pleased to inform you that the biopsies taken during your recent endoscopic examination did not show any evidence of cancer upon pathologic examination.  Additional information/recommendations:  __No further action is needed at this time.  Please follow-up with      your primary care physician for your other healthcare needs.  __ Please call [phone] to schedule a return visit to review      your condition.  _X_ Continue with the treatment plan as outlined on the day of your      exam.BIOPSY FOR H.PYLORI WAS NORMAL....  __ You should have a repeat endoscopic examination for this problem              in _ months/years.   Please call us if you are having persistent problems or have questions about your condition that have not been fully answered at this time.  Sincerely,  Mardella Layman MD [facility]  This letter has been electronically signed by your physician.

## 2010-05-26 NOTE — Procedures (Signed)
Summary: Colonoscopy   Colonoscopy  Procedure date:  05/10/2010  Findings:      Location:  [facility].   COLONOSCOPY PROCEDURE REPORT  PATIENT:  Kellie Curtis, Kellie Curtis  MR#:  [id number] BIRTHDATE:   05/12/1970, 39 yrs. old   GENDER:   female ENDOSCOPIST:   Vania Rea. Jarold Motto, MD, [facility] REF. BY:  PROCEDURE DATE:  05/10/2010 PROCEDURE:  Colonoscopy with snare polypectomy ASA CLASS:   Class II INDICATIONS: Abdominal pain, constipation  MEDICATIONS:    Fentanyl 50 mcg IV, Versed 7 mg IV  DESCRIPTION OF PROCEDURE:   After the risks benefits and alternatives of the procedure were thoroughly explained, informed consent was obtained.  Digital rectal exam was performed and revealed no abnormalities.   The LB CF-H180AL [id number] endoscope was introduced through the anus and advanced to the terminal ileum which was intubated for a short distance, without limitations.  The quality of the prep was excellent, using MoviPrep.  The instrument was then slowly withdrawn as the colon was fully examined. <<PROCEDUREIMAGES>>      <<OLD IMAGES>>  FINDINGS:  :  There were multiple polyps identified and removed. in the sigmoid colon. -6 MM POLYPS COLD AND HOT SNARE REMOVED.#4.ALL IN SIGMOID AREA. This was otherwise a normal examination of the colon.   Retroflexed views in the rectum revealed no abnormalities.    The scope was then withdrawn from the patient and the procedure completed.  COMPLICATIONS:   None ENDOSCOPIC IMPRESSION:  1) Polyps, multiple in the sigmoid colon  2) Otherwise normal examination  R/O ADENOMAS. RECOMMENDATIONS:  1) Await pathology results  2) Upper endoscopy will be scheduled REPEAT EXAM:   No   _______________________________ Vania Rea. Jarold Motto, MD, [facility]  CC: Corwin Levins, MD    Appended Document: Colonoscopy     Procedures Next Due Date:    Colonoscopy: 05/2013

## 2010-05-26 NOTE — Letter (Signed)
Summary: Patient Notice- Polyp Results  [facility]  [address] [place], [place] [address]   Phone: [phone]  Fax: [phone]        May 14, 2010 MRN: [id number]    [facility] [address] [place], [place]  [address]    Dear Ms. Strathman,  I am pleased to inform you that the colon polyp(s) removed during your recent colonoscopy was (were) found to be benign (no cancer detected) upon pathologic examination.  I recommend you have a repeat colonoscopy examination in 3_ years to look for recurrent polyps, as having colon polyps increases your risk for having recurrent polyps or even colon cancer in the future.  Should you develop new or worsening symptoms of abdominal pain, bowel habit changes or bleeding from the rectum or bowels, please schedule an evaluation with either your primary care physician or with me.  Additional information/recommendations:  __ No further action with gastroenterology is needed at this time. Please      follow-up with your primary care physician for your other healthcare      needs.  __ Please call [phone] to schedule a return visit to review your      situation.  __ Please keep your follow-up visit as already scheduled.  xx__ Continue treatment plan as outlined the day of your exam.  Please call us if you are having persistent problems or have questions about your condition that have not been fully answered at this time.  Sincerely,  Mardella Layman MD [facility]  This letter has been electronically signed by your physician.  Appended Document: Patient Notice- Polyp Results Letter Mailed

## 2010-05-26 NOTE — Miscellaneous (Signed)
  Clinical Lists Changes  Problems: Added new problem of COLONIC POLYPS, HX OF (ICD-V12.72) Changed problem from GERD (ICD-530.81) to HIATAL HERNIA WITH REFLUX (ICD-553.3)

## 2010-05-27 ENCOUNTER — Ambulatory Visit: Payer: BC Managed Care – PPO | Admitting: Gastroenterology

## 2010-06-01 NOTE — Progress Notes (Signed)
Summary: PA-Methylphenidate  Phone Note From Pharmacy   Summary of Call: PA-Methylphenidate, awaiting form from [employer] @ [phone]. Kellie Curtis  May 20, 2010 2:54 PM Form to Dr Jonny Ruiz to complete. Kellie Curtis  May 20, 2010 3:44 PM PA faxed to [facility] @  [phone], awaiting approval Kellie Curtis  May 21, 2010 11:58 AM Approved 04/30/10 - 05/21/11, pharmacy notified.  Initial call taken by: Kellie Curtis,  May 24, 2010 2:40 PM

## 2010-06-01 NOTE — Assessment & Plan Note (Signed)
Summary: f/u endo/colon per DRP/ cynthia no show co pay   History of Present Illness Visit Type: Follow-up Visit Primary GI MD: Sheryn Bison MD FACP FAGA Primary Provider: Oliver Barre, MD Requesting Provider: na Chief Complaint: F/u from colon/endo. Pt states that she is doing great and denies any GI complaints  History of Present Illness:   this patient is asymptomatic at this time except for mild constipation. Her abdominal pain is resolved, and she denies significant constipation. Meds are listed and reviewed.   GI Review of Systems      Denies abdominal pain, acid reflux, belching, bloating, chest pain, dysphagia with liquids, dysphagia with solids, heartburn, loss of appetite, nausea, vomiting, vomiting blood, weight loss, and  weight gain.        Denies anal fissure, black tarry stools, change in bowel habit, constipation, diarrhea, diverticulosis, fecal incontinence, heme positive stool, hemorrhoids, irritable bowel syndrome, jaundice, light color stool, liver problems, rectal bleeding, and  rectal pain.    Current Medications (verified): 1)  Nexium 40 Mg Cpdr (Esomeprazole Magnesium) .... Take 1 Capsule 30 Min Before Breakfast 2)  Align  Caps (Probiotic Product) .... Take 1 Capsule Daily 3)  Citalopram Hydrobromide 20 Mg Tabs (Citalopram Hydrobromide) .Marland Kitchen.. 1 By Mouth Once Daily 4)  Amitiza 8 Mcg Caps (Lubiprostone) .... Take One By Mouth Two Times A Day 5)  Tramadol Hcl 50 Mg Tabs (Tramadol Hcl) .... Take One By Mouth Two Times A Day 6)  Methylphenidate Hcl 10 Mg Tabs (Methylphenidate Hcl) .Marland Kitchen.. 1po Three Times A Day  Allergies (verified): No Known Drug Allergies  Past History:  Past medical, surgical, family and social histories (including risk factors) reviewed for relevance to current acute and chronic problems.  Past Medical History: ?SEIZURE DISORDER Learning Disorder COLONIC POLYPS, HX OF (ICD-V12.72) ABDOMINAL PAIN-LUQ (ICD-789.02) HIATAL HERNIA WITH REFLUX  (ICD-553.3) PREVENTIVE HEALTH CARE (ICD-V70.0) ABDOMINAL PAIN, LEFT LOWER QUADRANT (ICD-789.04) CONSTIPATION (ICD-564.00) LUQ PAIN (ICD-789.02) ABDOMINAL PAIN, EPIGASTRIC (ICD-789.06) WHEEZING (ICD-786.07) CHEST PAIN (ICD-786.50) FEVER UNSPECIFIED (ICD-780.60) ADHD (ICD-314.01) ELEVATED BLOOD PRESSURE WITHOUT DIAGNOSIS OF HYPERTENSION (ICD-796.2) DEPRESSION (ICD-311) ANXIETY (ICD-300.00) SEIZURE DISORDER (ICD-780.39) BACK PAIN (ICD-724.5) CHEST PAIN (ICD-786.50) SINUSITIS- ACUTE-NOS (ICD-461.9) HYPERLIPIDEMIA (ICD-272.4)  Past Surgical History: Reviewed history from 02/07/2008 and no changes required. Caesarean section x 2- one with anemia and blood transfusion  Family History: Reviewed history from 02/07/2008 and no changes required. father with stroke x 5; gout, HTN, elev cholesterol grandfather woith ETOH grandmother with breast cancer, HTN mother and grandmother with asthma No FH of Colon Cancer:  Social History: Reviewed history from 04/29/2010 and no changes required. 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 Smoked Alcohol use-no Drug use-no  Review of Systems  The patient denies allergy/sinus, anemia, anxiety-new, arthritis/joint pain, back pain, blood in urine, breast changes/lumps, change in vision, confusion, cough, coughing up blood, depression-new, fainting, fatigue, fever, headaches-new, hearing problems, heart murmur, heart rhythm changes, itching, menstrual pain, muscle pains/cramps, night sweats, nosebleeds, pregnancy symptoms, shortness of breath, skin rash, sleeping problems, sore throat, swelling of feet/legs, swollen lymph glands, thirst - excessive , urination - excessive , urination changes/pain, urine leakage, vision changes, and voice change.    Vital Signs:  Patient profile:   40 year old female Height:      64 inches Weight:      194 pounds BMI:     33.42 BSA:     1.93 Pulse rate:   64 / minute Pulse rhythm:   regular BP  sitting:   122 /  64  (left arm) Cuff size:   regular  Vitals Entered By: Ok Anis CMA (May 25, 2010 3:59 PM)  Physical Exam  General:  Well developed, well nourished, no acute distress.healthy appearing.   Head:  Normocephalic and atraumatic. Eyes:  PERRLA, no icterus.exam deferred to patient's ophthalmologist.   Abdomen:  Soft, nontender and nondistended. No masses, hepatosplenomegaly or hernias noted. Normal bowel sounds. Psych:  Alert and cooperative. Normal mood and affect.   Impression & Recommendations:  Problem # 1:  GERD (ICD-530.81) Assessment Improved Continue reflux regime and daily Nexium therapy with office followup in 2 months time. Gastric biopsies were negative for H. pylori. Orders: TLB-B12, Serum-Total ONLY (69629-B28) TLB-Ferritin (82728-FER) TLB-Folic Acid (Folate) (82746-FOL) TLB-IBC Pnl (Iron/FE;Transferrin) (83550-IBC)  Problem # 2:  COLONIC POLYPS, HX OF (ICD-V12.72) Assessment: Unchanged Pathologic exam did show adenomatous polyps, and she will need followup colonoscopy in 3 years time.  Problem # 3:  ABDOMINAL PAIN-LUQ (ICD-789.02) Assessment: Improved probably related to constipation predominant IBS. We will continue Amitiza 8 micrograms twice a day, high fiber diet, and liberal p.o. fluids.  Problem # 4:  ABDOMINAL PAIN, EPIGASTRIC (ICD-789.06) Assessment: Improved related to acid reflux, and this appeared to have resolved. She did have a CT scan that was negative but may need abdominal ultrasound exam to exclude cholelithiasis. Anemia profile ordered today.  Patient Instructions: 1)  Copy sent to : Oliver Barre, MD 2)  Please go to the basement today for your labs.  3)  Please schedule a follow-up appointment in 2 weeks.  4)  The medication list was reviewed and reconciled.  All changed / newly prescribed medications were explained.  A complete medication list was provided to the patient / caregiver. 5)  Please continue current medications.   6)  High Fiber, Low Fat  Healthy Eating Plan brochure given.

## 2010-06-01 NOTE — Medication Information (Signed)
Summary: Prior autho & approved for Methylphenidate/[facility]  Prior autho & approved for Methylphenidate/[facility]   Imported By: Sherian Rein 05/26/2010 09:26:52  _____________________________________________________________________  External Attachment:    Type:   Image     Comment:   External Document

## 2010-06-10 ENCOUNTER — Ambulatory Visit: Payer: BC Managed Care – PPO | Admitting: Gastroenterology

## 2010-07-08 ENCOUNTER — Encounter: Payer: Self-pay | Admitting: Gastroenterology

## 2010-07-08 ENCOUNTER — Ambulatory Visit (INDEPENDENT_AMBULATORY_CARE_PROVIDER_SITE_OTHER): Payer: BC Managed Care – PPO | Admitting: Gastroenterology

## 2010-07-08 VITALS — BP 100/70 | HR 72 | Ht 64.0 in | Wt 194.4 lb

## 2010-07-08 DIAGNOSIS — D126 Benign neoplasm of colon, unspecified: Secondary | ICD-10-CM

## 2010-07-08 DIAGNOSIS — K589 Irritable bowel syndrome without diarrhea: Secondary | ICD-10-CM

## 2010-07-08 DIAGNOSIS — K219 Gastro-esophageal reflux disease without esophagitis: Secondary | ICD-10-CM

## 2010-07-08 DIAGNOSIS — K635 Polyp of colon: Secondary | ICD-10-CM

## 2010-07-08 MED ORDER — ESOMEPRAZOLE MAGNESIUM 40 MG PO CPDR: 40.0000 mg | DELAYED_RELEASE_CAPSULE | Freq: Every day | ORAL | Status: DC

## 2010-07-08 NOTE — Progress Notes (Signed)
This is a 40 year old African American female with constipation predominant IBS and GERD. She recently underwent endoscopy and colonoscopy and had removal of some adenomatous colon polyps. She was markedly improved on Amitiza 8 mcg twice a day, Nexium 40 mg a day, and daily probiotics. She ran out of these medications had recurrence of her gas, bloating, and constipation and GERD. Follows a regular diet denies any specific food intolerances or lactose intolerance. Review of her labs were otherwise unremarkable.  Current Medications, Allergies, Past Medical History, Past Surgical History, Family History and Social History were reviewed in [employer] record.  Pertinent Review of Systems Negative   Physical Exam: LT appearing female in no acute distress appearing her stated age. Abdominal exam is entirely benign without organomegaly, masses, tenderness, or abnormal bowel sounds. No stigmata of chronic liver disease noted. Mental status is normal.    Assessment and Plan: Recurrence of her symptomatology off her medications. All of her drugs have been renewed, reflux regime and constipation regime reviewed, and she will see me in 3 months time or when necessary as needed. No diagnosis found.

## 2010-07-08 NOTE — Patient Instructions (Signed)
Today we have gave you samples of Align once the samples are gone you can buy it over the counter.  You were given samples of Nexium and once they are gone you have a rx at the pharmacy.  You were given samples of Amitiza and once they are gone you have an rx at the pharmacy.  Make an office visit to come back and see Dr Jarold Motto in 3 months.

## 2010-08-20 NOTE — H&P (Signed)
Kellie Curtis, Kellie Curtis               ACCOUNT NO.:  [id number]   MEDICAL RECORD NO.:  [id number]          PATIENT TYPE:  INP   LOCATION:  NA                            [facility]ACILITY:  [facility]   PHYSICIAN:  Charles A. Delcambre, MDDATE OF BIRTH:  05/19/1970   DATE OF ADMISSION:  02/22/2005  DATE OF DISCHARGE:                                HISTORY & PHYSICAL   She is being admitted for repeat cesarean section and bilateral tubal  ligation on February 22, 2005.  She is a 40 year old gravida 3, para 2-0-0-  2, EDC February 25, 2005.  Pregnancy complicated by advanced maternal age,  previous cesarean section and anemia.  She has been on b.i.d. iron and a  prenatal vitamin.  Last hemoglobin at 28 weeks was 9.5.  One-hour Glucola  135.  Quad marker normal.  First trimester screen declined.  Cystic fibrosis  declined.  Pap, GC and Chlamydia negative.  Syphilis and HIV negative.  Hepatitis B surface antigen negative.  Rubella immune.  Blood type A  positive, antibody screen negative.   PAST MEDICAL HISTORY:  Blood transfusion in the past with first C-section,  not specified further.   SURGICAL HISTORY:  C-section for preeclampsia and CPD, 9 pounds 2 ounces.  VBAC was successful, 8 pounds 2 ounces, but now requesting repeat cesarean  section and tubal ligation.   MEDICATIONS:  Prenatal vitamins and iron, twice a day iron.   ALLERGIES:  No known drug allergies.   SOCIAL HISTORY:  No tobacco, ethanol or drug use.  She is married, in a  monogamous relationship with her husband.   FAMILY HISTORY:  Alcoholism, lung cancer, diabetes.  Otherwise no major  illnesses.   REVIEW OF SYSTEMS:  Denies chest pain, fever, chills, nausea or vomiting,  scotomata, right upper quadrant pain, headache, vision changes.  Moderate  edema bilaterally.  No contractions, ruptured membranes or bleeding at this  time.  She notes active fetal movement.   PHYSICAL EXAMINATION:  GENERAL:  Alert and oriented x3, in no  distress.  VITAL SIGNS:  Blood pressure 110/70, respirations 18, pulse 90.  HEENT:  Grossly within normal limits.  NECK:  Supple without thyromegaly.  CHEST:  Lungs are clear.  CARDIAC:  Regular rate and rhythm, a 2/6 systolic ejection murmur at the  left sternal border.  BREASTS:  No masses, tenderness, discharge, skin or nipple change  bilaterally.  ABDOMEN:  Fundal height 41 cm today.  Fetal heart rate 150s.  Vertex by  Leopold's.  Cervix is not checked.  EXTREMITIES:  Mild edema bilaterally, symmetrical.   ASSESSMENT:  Intrauterine pregnancy, planned to be 39-1/2 weeks upon  admission, for repeat cesarean section and tubal ligation for undesired  fertility.   PLAN:  Admit a.m., n.p.o. status, 7:30, repeat cesarean section with  bilateral tubal ligation.  Preoperative CBC, type and screen.  All questions  were answered.  She will remain n.p.o. past midnight the evening prior to  surgery.  Will  proceed as outlined.  The patient accepts the risks of infection, bleeding,  bowel or bladder damage, ureteral damage, blood product risks  to include  hepatitis and HIV exposure, incisional infection, DVT risks, and possible  failure risk, approximately one in 400, of tubal ligation.  We will proceed  as outlined.      Charles A. Sydnee Cabal, MD  Electronically Signed     CAD/MEDQ  D:  02/10/2005  T:  02/10/2005  Job:  [id number]

## 2010-08-20 NOTE — Op Note (Signed)
NAMESYRIA, Kellie Curtis               ACCOUNT NO.:  [id number]   MEDICAL RECORD NO.:  [id number]         PATIENT TYPE:  INP   LOCATION:  9121                          [facility]ILITY:  [facility]   PHYSICIAN:  Charles A. Delcambre, MDDATE OF BIRTH:  [birth year]   DATE OF PROCEDURE:  02/22/2005  DATE OF DISCHARGE:                                 OPERATIVE REPORT   PREOPERATIVE DIAGNOSES:  1.  Intrauterine pregnancy at 39 weeks and 4 days.  2.  Previous cesarean section.  3.  Undesired fertility.   POSTOPERATIVE DIAGNOSES:  1.  Intrauterine pregnancy at 39 weeks and 4 days.  2.  Previous cesarean section.  3.  Undesired fertility.   PROCEDURE:  1.  Repeat low transverse cesarean section.  2.  Modified Pomeroy bilateral tubal ligation.   ASSISTANT:  Kellie Leitz, MD.   COMPLICATIONS:  None.   ESTIMATED BLOOD LOSS:  600 mL.   ANESTHESIA:  Spinal.   SPECIMENS:  Portion of right and left fallopian tube to pathology.   OPERATIVE FINDINGS:  Normal tubes and ovaries and uterus.  Normal-appearing  placenta.  Vigorous female with Apgars of 8 and 9, 8 pounds 6 ounces weight.  Instrument, sponge, and needle count correct x2.   DESCRIPTION OF PROCEDURE:  The patient was taken to the operating room and  placed in supine position.  After spinal was induced, spinal was not  adequate and the patient was sterilely prepped and draped with an adherent  drape.  She was sat up and redosed with a second spinal, the first being  lidocaine and the second being Marcaine.  Sterile prep was not broken.  She  was replaced in supine position and we proceeded with surgery after adequate  block was documented.  A vertical skin incision was made.  The previous scar  was excised per patient request.  The fascia was incised with a knife and  Mayo scissors.  The peritoneal cavity was entered without damage to  intraperitoneal structures or bladder.  Bladder blade was placed.  Vesicouterine peritoneum was incised with  Metzenbaum scissors.  Blunt  dissection was used to develop the bladder flap.  The lower uterine segment  was scored in a transverse fashion to an amniotomy with a hemostat without  damage to the infant.  Clear amniotic fluid was noted with rupture of the  amniotic sac with Allis forceps.  A hand was inserted and the occiput was  lifted to the incision site.  Fundal pressure by the operator's assistant  was used to deliver the infant without difficulty.  Cord was clamped and the  infant was cut free and shown to the parents and then was given to the  neonatologist in attendance.  The placenta was manually expressed.  The  anterior surface of the uterus was wiped with a moistened laparotomy pad.  The uterus was externalized for repair.  A single layer closure was done  with #1 chromic.  A running locking suture was used to close the incision.  Two figure-of-8 sutures at the right angle were used for hemostasis.  Hemostasis was excellent with minor electrocautery  at the uterine serosa.  The attention was then turned to the tubal ligation.  The middle segment of  the tube was doubly ligated with 0 plain gut bilaterally and excised to go  to pathology.  The lumen was clearly transected.  Hemostasis of the pedicle  was excellent on either side.  The uterus was reinternalized.  The pedicles  were revisualized.  Irrigation was carried out.  The pedicles were  revisualized.  Hemostasis was excellent.  Pedicles were intact.  Uterine  incision was of excellent hemostasis.  The bladder flap was of excellent  hemostasis.  The subfascial hemostasis was of good hemostasis.  The fascia  was then closed en bloc with the peritoneum with #1 PDS running en bloc  closure.  Subcutaneous hemostasis was excellent.  After irrigation and minor  electrocautery, 2-0 Vicryl was used as an interrupted suture subcutaneously  to close this space.  Sterile skin clips were used to close the skin.  A  sterile dressing was  applied.  The patient was taken to the recovery room  with physician in attendance, having tolerated the procedure well.      Charles A. Sydnee Cabal, MD  Electronically Signed     CAD/MEDQ  D:  02/22/2005  T:  02/22/2005  Job:  [id number]

## 2010-08-20 NOTE — Discharge Summary (Signed)
Kellie Curtis, Kellie Curtis               ACCOUNT NO.:  [id number]   MEDICAL RECORD NO.:  [id number]          PATIENT TYPE:  INP   LOCATION:  9121                          [facility]ILITY[facility]  [facility]   PHYSICIAN:  Charles A. Delcambre, MDDATE OF BIRTH:  1970-09-06   DATE OF ADMISSION:  02/22/2005  DATE OF DISCHARGE:  02/25/2005                                 DISCHARGE SUMMARY   PRIMARY DISCHARGE DIAGNOSES:  1.  Intrauterine pregnancy at 39 weeks and 4 days.  2.  Previous cesarean section.  3.  Undesired fertility.   PROCEDURE:  Repeat low transverse cesarean section and modified Pomeroy  bilateral tubal ligation.   OPERATIVE FINDINGS:  A vigorous female, 8 pounds, 6 ounces, Apgar's 8 and 9.  Normal uterus, tubes and ovaries. Portion of right and left fallopian tubes  to pathology.   DISPOSITION:  The patient was discharged home to follow up in the office in  3 days to discontinue staples. She was given convalescent instructions per  Dr. Ashley Royalty.   DISCHARGE MEDICATIONS:  1.  Percocet 1-2 p.o. q.4h p.r.n. #40.  2.  Motrin 600 mg one q.6h p.r.n. #30.  3.  Instructed to take iron tablets once daily with prenatal vitamins.   Convalesce at home. Notify me of temperature over 100 degrees, increased  pain or bleeding, incisional erythema or drainage. No driving x2 weeks. No  sexual intercourse x6 weeks. No heavy lifting greater than 25 pounds x1  month.  Shower is okay x2 weeks, bathing thereafter.   LABORATORY DATA:  Postoperative hematocrit 27.5.   Admission history and physical is dictated and on the chart.   HOSPITAL COURSE:  The patient was admitted and underwent surgery as noted  above. Postoperative the patient had no complications. No febrile morbidity.  She voided without difficulty after her cath was discontinued on  postoperative day #1. She was given a general diet on postoperative day #1  with return of flatus. She had good pain control. She was ambulating on  postoperative day #2 with  no complications. She continued to do well,  tolerating a general diet, voiding, ambulating. Pain was controlled. No  febrile morbidity.  She was discharged home with follow up as noted above on postoperative day  #3.     Charles A. Sydnee Cabal, MD  Electronically Signed    CAD/MEDQ  D:  03/31/2005  T:  03/31/2005  Job:  [id number]

## 2010-09-01 ENCOUNTER — Other Ambulatory Visit: Payer: Self-pay

## 2010-09-01 MED ORDER — METHYLPHENIDATE HCL 10 MG PO TABS: 10.0000 mg | ORAL_TABLET | Freq: Three times a day (TID) | ORAL | Status: DC

## 2010-09-01 NOTE — Telephone Encounter (Signed)
Pt informed, Rx in cabinet for pt pick up  

## 2010-09-09 ENCOUNTER — Ambulatory Visit: Payer: BC Managed Care – PPO | Admitting: Gastroenterology

## 2010-10-27 ENCOUNTER — Other Ambulatory Visit: Payer: Self-pay | Admitting: *Deleted

## 2010-10-27 MED ORDER — LUBIPROSTONE 8 MCG PO CAPS: 8.0000 ug | ORAL_CAPSULE | Freq: Two times a day (BID) | ORAL | Status: DC

## 2010-10-27 NOTE — Telephone Encounter (Signed)
Called to see if pt wanted a sooner appt but she actually can not come tomorrow she does not have the money i have refilled her amitiza and she has refills on her Nexium

## 2010-10-28 ENCOUNTER — Ambulatory Visit: Payer: BC Managed Care – PPO | Admitting: Gastroenterology

## 2011-01-04 LAB — URINALYSIS, ROUTINE W REFLEX MICROSCOPIC
Glucose, UA: NEGATIVE
Hgb urine dipstick: NEGATIVE
Ketones, ur: NEGATIVE
Protein, ur: NEGATIVE

## 2011-01-04 LAB — COMPREHENSIVE METABOLIC PANEL
Albumin: 3.9
Alkaline Phosphatase: 29 — ABNORMAL LOW
BUN: 7
Creatinine, Ser: 0.74
Glucose, Bld: 127 — ABNORMAL HIGH
Potassium: 4.5
Total Bilirubin: 0.5
Total Protein: 7

## 2011-01-04 LAB — DIFFERENTIAL
Basophils Absolute: 0
Basophils Relative: 0
Lymphocytes Relative: 13
Monocytes Relative: 2 — ABNORMAL LOW
Neutro Abs: 6.3
Neutrophils Relative %: 85 — ABNORMAL HIGH

## 2011-01-04 LAB — CBC
HCT: 36.8
Hemoglobin: 11.9 — ABNORMAL LOW
MCHC: 32.2
RDW: 13.8

## 2011-01-04 LAB — PROTIME-INR: INR: 1

## 2011-01-04 LAB — APTT: aPTT: 27

## 2011-05-28 ENCOUNTER — Encounter: Payer: Self-pay | Admitting: Internal Medicine

## 2011-05-28 DIAGNOSIS — Z0001 Encounter for general adult medical examination with abnormal findings: Secondary | ICD-10-CM | POA: Insufficient documentation

## 2011-05-30 ENCOUNTER — Ambulatory Visit (INDEPENDENT_AMBULATORY_CARE_PROVIDER_SITE_OTHER): Payer: BC Managed Care – PPO | Admitting: Internal Medicine

## 2011-05-30 ENCOUNTER — Encounter: Payer: Self-pay | Admitting: Internal Medicine

## 2011-05-30 ENCOUNTER — Other Ambulatory Visit (INDEPENDENT_AMBULATORY_CARE_PROVIDER_SITE_OTHER): Payer: BC Managed Care – PPO

## 2011-05-30 VITALS — BP 122/82 | HR 100 | Temp 98.1°F | Ht 64.0 in | Wt 200.0 lb

## 2011-05-30 DIAGNOSIS — K581 Irritable bowel syndrome with constipation: Secondary | ICD-10-CM | POA: Insufficient documentation

## 2011-05-30 DIAGNOSIS — Z0000 Encounter for general adult medical examination without abnormal findings: Secondary | ICD-10-CM

## 2011-05-30 DIAGNOSIS — J209 Acute bronchitis, unspecified: Secondary | ICD-10-CM

## 2011-05-30 DIAGNOSIS — N926 Irregular menstruation, unspecified: Secondary | ICD-10-CM | POA: Insufficient documentation

## 2011-05-30 DIAGNOSIS — K589 Irritable bowel syndrome without diarrhea: Secondary | ICD-10-CM

## 2011-05-30 DIAGNOSIS — F411 Generalized anxiety disorder: Secondary | ICD-10-CM

## 2011-05-30 DIAGNOSIS — F909 Attention-deficit hyperactivity disorder, unspecified type: Secondary | ICD-10-CM

## 2011-05-30 DIAGNOSIS — K449 Diaphragmatic hernia without obstruction or gangrene: Secondary | ICD-10-CM

## 2011-05-30 LAB — URINALYSIS, ROUTINE W REFLEX MICROSCOPIC
Bilirubin Urine: NEGATIVE
Leukocytes, UA: NEGATIVE
Nitrite: NEGATIVE
pH: 6.5 (ref 5.0–8.0)

## 2011-05-30 LAB — HEPATIC FUNCTION PANEL
ALT: 16 U/L (ref 0–35)
AST: 16 U/L (ref 0–37)
Albumin: 4.3 g/dL (ref 3.5–5.2)
Total Protein: 7.8 g/dL (ref 6.0–8.3)

## 2011-05-30 LAB — CBC WITH DIFFERENTIAL/PLATELET
Eosinophils Relative: 1.3 % (ref 0.0–5.0)
Lymphocytes Relative: 26.6 % (ref 12.0–46.0)
MCV: 92.4 fl (ref 78.0–100.0)
Monocytes Absolute: 0.5 10*3/uL (ref 0.1–1.0)
Monocytes Relative: 8.2 % (ref 3.0–12.0)
Neutrophils Relative %: 63 % (ref 43.0–77.0)
Platelets: 322 10*3/uL (ref 150.0–400.0)
WBC: 5.9 10*3/uL (ref 4.5–10.5)

## 2011-05-30 LAB — BASIC METABOLIC PANEL
BUN: 12 mg/dL (ref 6–23)
CO2: 29 mEq/L (ref 19–32)
Chloride: 102 mEq/L (ref 96–112)
Creatinine, Ser: 0.8 mg/dL (ref 0.4–1.2)
Glucose, Bld: 102 mg/dL — ABNORMAL HIGH (ref 70–99)
Potassium: 4.3 mEq/L (ref 3.5–5.1)

## 2011-05-30 LAB — LIPID PANEL: Cholesterol: 207 mg/dL — ABNORMAL HIGH (ref 0–200)

## 2011-05-30 LAB — TSH: TSH: 1.25 u[IU]/mL (ref 0.35–5.50)

## 2011-05-30 MED ORDER — AZITHROMYCIN 250 MG PO TABS: ORAL_TABLET | ORAL | Status: AC

## 2011-05-30 MED ORDER — METHYLPHENIDATE HCL 10 MG PO TABS: 10.0000 mg | ORAL_TABLET | Freq: Three times a day (TID) | ORAL | Status: DC

## 2011-05-30 MED ORDER — OMEPRAZOLE 20 MG PO CPDR: 20.0000 mg | DELAYED_RELEASE_CAPSULE | Freq: Every day | ORAL | Status: DC

## 2011-05-30 MED ORDER — ESCITALOPRAM OXALATE 10 MG PO TABS: 10.0000 mg | ORAL_TABLET | Freq: Every day | ORAL | Status: DC

## 2011-05-30 NOTE — Assessment & Plan Note (Signed)
Most likely etiology of abd pain, unable to afford amitiza and no generic available or similar med,  For diet and align,  to f/u any worsening symptoms or concerns

## 2011-05-30 NOTE — Assessment & Plan Note (Signed)
uncontroled , to try change to more affordable omeprazle asd,  to f/u any worsening symptoms or concerns

## 2011-05-30 NOTE — Progress Notes (Signed)
Subjective:    Patient ID: Kellie Curtis, female    DOB: 1970/08/03, 41 y.o.   MRN: [id number]  HPI Here for wellness and f/u;  Overall doing ok;  Pt denies CP, worsening SOB, DOE, wheezing, orthopnea, PND, worsening LE edema, palpitations, dizziness or syncope, except for an episode of presyncope last wk not recurred.  Pt denies neurological change such as new Headache, facial or extremity weakness.  Pt denies polydipsia, polyuria, or low sugar symptoms. Pt states overall good compliance with treatment and medications, good tolerability, and trying to follow lower cholesterol diet.  . No fever, wt loss, night sweats, loss of appetite, or other constitutional symptoms.  Pt states good ability with ADL's, low fall risk, home safety reviewed and adequate, no significant changes in hearing or vision, and very occasionally only active with exercise.  Menses some different last mo, a few days early last mo by 3 days, on for 8 days, this mo started Feb 2 then ongoing for 14 days, off for 1 wk, then no further mense, but had osnet mid abd to LLQ to left flank pain, mod, and though she might be having onset mense again, but did not; started 4 days ago, sharp, tender, ibuprofen helped both the abd and back pain and resolved until slight pain this am.  Thinks stress may be aggrevating, has mult stressors between work, home and father's current illness with cancer.  Had some tingling with urination a few days ago , but overall  -  Denies urinary symptoms such as dysuria, frequency, urgency,or hematuria.  Has known IBS, cant afford all meds she was prescribed, and has recurrent constipation, the amitiza helped but just cannot afford.  No fever, diarrhea, abd distension, or vomiting, but has occas nausea.   Has had mild worsening depressive symptoms, but no suicidal ideation, or panic, though has ongoing anxiety.  Did not take meds from last visit due to cost, not taking the nethylphenidate for ADD as well as she was  failing a class as [employer] so quit the class and the med after last visit here last yr.  Jan 2012 CT abd neg for acute  Here with acute onset mild to mod 2-3 days ST, HA, general weakness and malaise, with prod cough greenish sputum and occas small blood in sputum, swollen glands to the neck  Mult persons in her house, including her live in female significant other missed 1 wks with similar symptoms before her onset.   Past Medical History  Diagnosis Date  . Learning disorder   . Seizure disorder   . Personal history of colonic polyps 05/12/2010    TUBULAR ADENOMA  . Abdominal pain, left upper quadrant   . Hiatal hernia   . Abdominal pain, left lower quadrant   . Unspecified constipation   . Abdominal pain, left upper quadrant   . Abdominal pain, epigastric   . Wheezing   . Chest pain, unspecified   . Fever, unspecified   . Attention deficit disorder with hyperactivity   . Elevated blood pressure reading without diagnosis of hypertension   . Depressive disorder, not elsewhere classified   . Anxiety state, unspecified   . Other convulsions   . Backache, unspecified   . Chest pain, unspecified   . Acute sinusitis, unspecified   . Other and unspecified hyperlipidemia    Past Surgical History  Procedure Date  . Cesarean section     X 2    reports that she has never smoked. She has never  used smokeless tobacco. She reports that she does not drink alcohol or use illicit drugs. family history includes Alcohol abuse in an unspecified family member; Asthma in her mother and unspecified family member; Breast cancer in an unspecified family member; Gout in her father; Hyperlipidemia in her father; Hypertension in her father and unspecified family member; and Stroke in her father.  There is no history of Colon cancer. No Known Allergies Current Outpatient Prescriptions on File Prior to Visit  Medication Sig Dispense Refill  . citalopram (CELEXA) 20 MG tablet Take 20 mg by mouth daily.        Marland Kitchen  esomeprazole (NEXIUM) 40 MG capsule Take 1 capsule (40 mg total) by mouth daily before breakfast.  30 capsule  11  . lubiprostone (AMITIZA) 8 MCG capsule Take 1 capsule (8 mcg total) by mouth 2 (two) times daily with a meal.  60 capsule  11  . methylphenidate (RITALIN) 10 MG tablet Take 1 tablet (10 mg total) by mouth 3 (three) times daily. To fill Sep 01, 2010  90 tablet  0  . Probiotic Product (ALIGN PO) Take 1 capsule by mouth daily.        . traMADol (ULTRAM) 50 MG tablet Take 50 mg by mouth 2 (two) times daily.         Review of Systems Review of Systems  Constitutional: Negative for diaphoresis and unexpected weight change.  HENT: Negative for drooling and tinnitus.   Eyes: Negative for photophobia and visual disturbance.  Respiratory: Negative for choking and stridor.   Gastrointestinal: Negative for vomiting and blood in stool.  Genitourinary: Negative for hematuria and decreased urine volume.  Musculoskeletal: Negative for gait problem.  Skin: Negative for color change and wound.  Neurological: Negative for tremors and numbness.  Psychiatric/Behavioral: Negative for decreased concentration. The patient is not hyperactive.       Objective:   Physical Exam BP 122/82  Pulse 100  Temp(Src) 98.1 F (36.7 C) (Oral)  Ht 5\' 4"  (1.626 m)  Wt 200 lb (90.719 kg)  BMI 34.33 kg/m2  SpO2 98% Physical Exam  VS noted, mild ill, obese Constitutional: Pt appears well-developed and well-nourished.  HENT: Head: Normocephalic.  Right Ear: External ear normal.  Left Ear: External ear normal.  Bilat tm's mild erythema.  Sinus nontender.  Pharynx mild erythema Eyes: Conjunctivae and EOM are normal. Pupils are equal, round, and reactive to light.  Neck: Normal range of motion. Neck supple.  Cardiovascular: Normal rate and regular rhythm.   Pulmonary/Chest: Effort normal and breath sounds normal.  Abd:  Soft, NT, non-distended, + BS Neurological: Pt is alert. No cranial nerve deficit.  Skin:  Skin is warm. No erythema.  Psychiatric: Pt behavior is normal. Thought content normal. 1-2+ nervous      Assessment & Plan:

## 2011-05-30 NOTE — Assessment & Plan Note (Signed)
Mild to mod, for GYN referral,  to f/u any worsening symptoms or concerns

## 2011-05-30 NOTE — Assessment & Plan Note (Signed)
Overall doing well, age appropriate education and counseling updated, referrals for preventative services and immunizations addressed, dietary and smoking counseling addressed, most recent labs and ECG reviewed.  I have personally reviewed and have noted: 1) the patient's medical and social history 2) The pt's use of alcohol, tobacco, and illicit drugs 3) The patient's current medications and supplements 4) Functional ability including ADL's, fall risk, home safety risk, hearing and visual impairment 5) Diet and physical activities 6) Evidence for depression or mood disorder 7) The patient's height, weight, and BMI have been recorded in the chart I have made referrals, and provided counseling and education based on review of the above For labs today, declines immunizations

## 2011-05-30 NOTE — Patient Instructions (Signed)
Take all new medications as prescribed - the antibiotic (generic zpack), lexapro generic for nerves and depression, omeprazole for acid reflux, generic ritalin for the ADD Continue all other medications as before , including the Align OTC if you can (this is not normally a prescription medication) You will be contacted regarding the referral for: GYN  - Dr Micheline Rough Please go to LAB in the [facility] for the blood and/or urine tests to be done today Please call the phone number [phone] (the [employer]Tree System) for results of testing in 2-3 days;  When calling, simply dial the number, and when prompted enter the MRN number above (the Medical Record Number) and the # key, then the message should start. Please return in 6 months, or sooner if needed

## 2011-05-30 NOTE — Assessment & Plan Note (Signed)
Now that lexapro is generic - ok to change to this, verified nonsuicidal, declines counseling,  to f/u any worsening symptoms or concerns

## 2011-05-30 NOTE — Assessment & Plan Note (Signed)
Ok to re-start med, as may help with concentration and task completion at work, and may help reduce work conflict

## 2011-05-30 NOTE — Assessment & Plan Note (Signed)
Mild to mod, for antibx course,  to f/u any worsening symptoms or concerns 

## 2011-06-03 ENCOUNTER — Telehealth: Payer: Self-pay

## 2011-06-03 NOTE — Telephone Encounter (Signed)
Ritalin has been approved from 05/12/2011 through 06/01/2012, case [id number]. Patient has been informed of approval. Faxed approval letter to pharmacy [facility]. Ma [place].

## 2011-06-27 ENCOUNTER — Other Ambulatory Visit (HOSPITAL_COMMUNITY): Payer: Self-pay | Admitting: Obstetrics & Gynecology

## 2011-06-27 DIAGNOSIS — Z1231 Encounter for screening mammogram for malignant neoplasm of breast: Secondary | ICD-10-CM

## 2011-07-11 ENCOUNTER — Encounter: Payer: Self-pay | Admitting: Internal Medicine

## 2011-07-18 ENCOUNTER — Telehealth: Payer: Self-pay | Admitting: Internal Medicine

## 2011-07-18 MED ORDER — AMPHETAMINE-DEXTROAMPHET ER 30 MG PO CP24: 30.0000 mg | ORAL_CAPSULE | ORAL | Status: DC

## 2011-07-18 NOTE — Telephone Encounter (Signed)
Done hardcopy to robin  

## 2011-07-18 NOTE — Telephone Encounter (Signed)
Called informed the patient prescription requested is ready for pickup.

## 2011-07-18 NOTE — Telephone Encounter (Signed)
PT WANTS TO SWITCH BACK TO ADDERALL GENERIC.  CALL WHEN SHE CAN COME PICK UP THE RX.

## 2011-07-20 ENCOUNTER — Ambulatory Visit (HOSPITAL_COMMUNITY): Payer: BC Managed Care – PPO

## 2011-11-07 ENCOUNTER — Emergency Department (HOSPITAL_COMMUNITY)
Admission: EM | Admit: 2011-11-07 | Discharge: 2011-11-07 | Disposition: A | Discharge status: Home / Self Care | Payer: BC Managed Care – PPO | Attending: Emergency Medicine | Admitting: Emergency Medicine

## 2011-11-07 ENCOUNTER — Encounter (HOSPITAL_COMMUNITY): Payer: Self-pay | Admitting: *Deleted

## 2011-11-07 DIAGNOSIS — F909 Attention-deficit hyperactivity disorder, unspecified type: Secondary | ICD-10-CM | POA: Insufficient documentation

## 2011-11-07 DIAGNOSIS — E785 Hyperlipidemia, unspecified: Secondary | ICD-10-CM | POA: Insufficient documentation

## 2011-11-07 DIAGNOSIS — R05 Cough: Secondary | ICD-10-CM

## 2011-11-07 DIAGNOSIS — R059 Cough, unspecified: Secondary | ICD-10-CM | POA: Insufficient documentation

## 2011-11-07 DIAGNOSIS — G40909 Epilepsy, unspecified, not intractable, without status epilepticus: Secondary | ICD-10-CM | POA: Insufficient documentation

## 2011-11-07 DIAGNOSIS — F411 Generalized anxiety disorder: Secondary | ICD-10-CM | POA: Insufficient documentation

## 2011-11-07 DIAGNOSIS — F3289 Other specified depressive episodes: Secondary | ICD-10-CM | POA: Insufficient documentation

## 2011-11-07 DIAGNOSIS — J040 Acute laryngitis: Secondary | ICD-10-CM | POA: Insufficient documentation

## 2011-11-07 DIAGNOSIS — F329 Major depressive disorder, single episode, unspecified: Secondary | ICD-10-CM | POA: Insufficient documentation

## 2011-11-07 LAB — RAPID STREP SCREEN (MED CTR MEBANE ONLY): Streptococcus, Group A Screen (Direct): NEGATIVE

## 2011-11-07 MED ORDER — HYDROCOD POLST-CHLORPHEN POLST 10-8 MG/5ML PO LQCR: 5.0000 mL | Freq: Two times a day (BID) | ORAL | Status: DC | PRN

## 2011-11-07 MED ORDER — IBUPROFEN 800 MG PO TABS: 800.0000 mg | ORAL_TABLET | Freq: Three times a day (TID) | ORAL | Status: AC | PRN

## 2011-11-07 NOTE — ED Provider Notes (Signed)
History     CSN: [id number]  Arrival date & time 11/07/11  1608   First MD Initiated Contact with Patient 11/07/11 1656      Chief Complaint  Patient presents with  . Cough  . Sore Throat    (Consider location/radiation/quality/duration/timing/severity/associated sxs/prior treatment) HPI Comments: Patient presents with complaint of upper respiratory tract infection symptoms for the past 5 days. Patient states that she initially had a headache and ear pain however this resolved and she developed sore throat and she complains of currently having a productive cough as well as hoarse voice. Patient has pain in her chest only when she coughs. Patient states that she took an unknown cough medicine as well as hydrocodone last night for the symptoms but they did not improve. She states that the pain is worse with swallowing however she can swallow. Nothing makes the symptoms better. Onset was gradual. Course is constant. Patient reports several sick contacts at her job. No shortness of breath.  Patient is a 41 y.o. female presenting with cough. The history is provided by the patient.  Cough This is a new problem. The current episode started more than 2 days ago. The problem occurs every few minutes. The problem has not changed since onset.The cough is non-productive. There has been no fever. Associated symptoms include chest pain (with coughing only), chills, headaches and sore throat. Pertinent negatives include no ear congestion, no ear pain (none currently), no rhinorrhea, no myalgias, no shortness of breath, no wheezing and no eye redness. She has tried an opioid for the symptoms. The treatment provided no relief. Her past medical history does not include pneumonia, COPD or asthma.    Past Medical History  Diagnosis Date  . Learning disorder   . Seizure disorder   . Personal history of colonic polyps 05/12/2010    TUBULAR ADENOMA  . Abdominal pain, left upper quadrant   . Hiatal hernia   .  Abdominal pain, left lower quadrant   . Unspecified constipation   . Abdominal pain, left upper quadrant   . Abdominal pain, epigastric   . Wheezing   . Chest pain, unspecified   . Fever, unspecified   . Attention deficit disorder with hyperactivity   . Elevated blood pressure reading without diagnosis of hypertension   . Depressive disorder, not elsewhere classified   . Anxiety state, unspecified   . Other convulsions   . Backache, unspecified   . Chest pain, unspecified   . Acute sinusitis, unspecified   . Other and unspecified hyperlipidemia     Past Surgical History  Procedure Date  . Cesarean section     X 2    Family History  Problem Relation Age of Onset  . Stroke Father   . Hypertension Father   . Gout Father   . Hyperlipidemia Father   . Alcohol abuse      grandfather  . Breast cancer      grandmother  . Hypertension      grandmother  . Asthma Mother   . Asthma      grandmother  . Colon cancer Neg Hx     History  Substance Use Topics  . Smoking status: Never Smoker   . Smokeless tobacco: Never Used  . Alcohol Use: No    OB History    Grav Para Term Preterm Abortions TAB SAB Ect Mult Living                  Review of Systems  Constitutional: Positive for chills. Negative for fever.  HENT: Positive for sore throat. Negative for ear pain (none currently) and rhinorrhea.   Eyes: Negative for redness.  Respiratory: Positive for cough. Negative for shortness of breath and wheezing.   Cardiovascular: Positive for chest pain (with coughing only).  Gastrointestinal: Negative for nausea, vomiting, abdominal pain and diarrhea.  Genitourinary: Negative for dysuria.  Musculoskeletal: Negative for myalgias.  Skin: Negative for rash.  Neurological: Positive for headaches.    Allergies  Review of patient's allergies indicates no known allergies.  Home Medications   Current Outpatient Rx  Name Route Sig Dispense Refill  . AMPHETAMINE-DEXTROAMPHET ER  30 MG PO CP24 Oral Take 30 mg by mouth every morning.    Marland Kitchen LEVONORGESTREL-ETHINYL ESTRAD 0.1-20 MG-MCG PO TABS Oral Take 1 tablet by mouth daily.    . AMPHETAMINE-DEXTROAMPHET ER 30 MG PO CP24 Oral Take 1 capsule (30 mg total) by mouth every morning. 30 capsule 0  . ESCITALOPRAM OXALATE 10 MG PO TABS Oral Take 1 tablet (10 mg total) by mouth daily. 90 tablet 3    BP 148/96  Pulse 92  Temp 98.9 F (37.2 C) (Oral)  Resp 18  SpO2 100%  Physical Exam  Nursing note and vitals reviewed. Constitutional: She appears well-developed and well-nourished.  HENT:  Head: Normocephalic and atraumatic.  Right Ear: Hearing, tympanic membrane and ear canal normal.  Left Ear: Hearing, tympanic membrane and ear canal normal.  Nose: Nose normal. No mucosal edema or rhinorrhea.  Mouth/Throat: Uvula is midline and mucous membranes are normal. Mucous membranes are not dry. Posterior oropharyngeal erythema present. No oropharyngeal exudate, posterior oropharyngeal edema or tonsillar abscesses.  Eyes: Conjunctivae are normal. Right eye exhibits no discharge. Left eye exhibits no discharge.  Neck: Normal range of motion. Neck supple.  Cardiovascular: Normal rate, regular rhythm and normal heart sounds.   Pulmonary/Chest: Effort normal and breath sounds normal. No respiratory distress. She has no wheezes. She has no rales.  Abdominal: Soft. There is no tenderness.  Lymphadenopathy:    She has no cervical adenopathy.  Neurological: She is alert.  Skin: Skin is warm and dry.  Psychiatric: She has a normal mood and affect.    ED Course  Procedures (including critical care time)   Labs Reviewed  RAPID STREP SCREEN   No results found.   1. Laryngitis   2. Cough     5:28 PM Patient seen and examined. Work-up initiated.    Vital signs reviewed and are as follows: Filed Vitals:   11/07/11 1645  BP: 148/96  Pulse: 92  Temp: 98.9 F (37.2 C)  Resp: 18   6:17 PM strep test was negative. Patient  was informed. Counseled patient on supportive care for her symptoms. Urged patient to use anti-inflammatory medicines. Patient requests cough syrup. Patient urged to return with worsening symptoms, persistent fever, worsening trouble breathing or shortness of breath. Patient verbalizes understanding and agrees with plan.   6:18 PM Patient counseled on use of narcotic pain medications. Urged not to drink alcohol, drive, or perform any other activities that requires focus while taking these medications. The patient verbalizes understanding and agrees with the plan.   MDM  Patient with sore throat, hoarse voice and cough. Lungs are clear and patient is afebrile. Do not suspect pneumonia. Possible bronchitis. Patient likely has laryngitis. Strep test checked and is negative. Patient is handling secretions. Do not suspect retropharyngeal abscess. Patient does not have a history of diabetes. Pain is not unilateral or  severe. Patient is afebrile. Patient appears well and is stable at time of discharge.        Renne Crigler, [place] 11/07/11 1820

## 2011-11-07 NOTE — ED Notes (Signed)
Pt alert and oriented x4. Respirations even and unlabored, bilateral symmetrical rise and fall of chest. Skin warm and dry. In no acute distress. Denies needs.   

## 2011-11-07 NOTE — ED Notes (Signed)
Pt reports on Wednesday pt had a migraine, eventually pain moved into ears and throat. Reports swollen tonsils and throat, now pt has productive cough green sputum. Voice is hoarse. Painful swallowing 7/10.

## 2011-11-08 NOTE — ED Provider Notes (Signed)
Medical screening examination/treatment/procedure(s) were performed by non-physician practitioner and as supervising physician I was immediately available for consultation/collaboration.  Arrie Borrelli, MD 11/08/11 1250 

## 2011-11-16 ENCOUNTER — Encounter (HOSPITAL_COMMUNITY): Payer: Self-pay | Admitting: Emergency Medicine

## 2011-11-16 ENCOUNTER — Emergency Department (HOSPITAL_COMMUNITY)
Admission: EM | Admit: 2011-11-16 | Discharge: 2011-11-16 | Disposition: A | Discharge status: Home / Self Care | Payer: BC Managed Care – PPO | Attending: Emergency Medicine | Admitting: Emergency Medicine

## 2011-11-16 DIAGNOSIS — R109 Unspecified abdominal pain: Secondary | ICD-10-CM | POA: Insufficient documentation

## 2011-11-16 DIAGNOSIS — R6250 Unspecified lack of expected normal physiological development in childhood: Secondary | ICD-10-CM | POA: Insufficient documentation

## 2011-11-16 DIAGNOSIS — F411 Generalized anxiety disorder: Secondary | ICD-10-CM | POA: Insufficient documentation

## 2011-11-16 DIAGNOSIS — Z823 Family history of stroke: Secondary | ICD-10-CM | POA: Insufficient documentation

## 2011-11-16 DIAGNOSIS — J029 Acute pharyngitis, unspecified: Secondary | ICD-10-CM

## 2011-11-16 DIAGNOSIS — Z8249 Family history of ischemic heart disease and other diseases of the circulatory system: Secondary | ICD-10-CM | POA: Insufficient documentation

## 2011-11-16 DIAGNOSIS — Z825 Family history of asthma and other chronic lower respiratory diseases: Secondary | ICD-10-CM | POA: Insufficient documentation

## 2011-11-16 DIAGNOSIS — Z6379 Other stressful life events affecting family and household: Secondary | ICD-10-CM | POA: Insufficient documentation

## 2011-11-16 DIAGNOSIS — F909 Attention-deficit hyperactivity disorder, unspecified type: Secondary | ICD-10-CM | POA: Insufficient documentation

## 2011-11-16 DIAGNOSIS — E785 Hyperlipidemia, unspecified: Secondary | ICD-10-CM | POA: Insufficient documentation

## 2011-11-16 DIAGNOSIS — Z860100 Personal history of colon polyps, unspecified: Secondary | ICD-10-CM | POA: Insufficient documentation

## 2011-11-16 DIAGNOSIS — F3289 Other specified depressive episodes: Secondary | ICD-10-CM | POA: Insufficient documentation

## 2011-11-16 DIAGNOSIS — G40909 Epilepsy, unspecified, not intractable, without status epilepticus: Secondary | ICD-10-CM | POA: Insufficient documentation

## 2011-11-16 DIAGNOSIS — F329 Major depressive disorder, single episode, unspecified: Secondary | ICD-10-CM | POA: Insufficient documentation

## 2011-11-16 DIAGNOSIS — Z803 Family history of malignant neoplasm of breast: Secondary | ICD-10-CM | POA: Insufficient documentation

## 2011-11-16 DIAGNOSIS — Z800 Family history of malignant neoplasm of digestive organs: Secondary | ICD-10-CM | POA: Insufficient documentation

## 2011-11-16 DIAGNOSIS — R030 Elevated blood-pressure reading, without diagnosis of hypertension: Secondary | ICD-10-CM | POA: Insufficient documentation

## 2011-11-16 DIAGNOSIS — Z8349 Family history of other endocrine, nutritional and metabolic diseases: Secondary | ICD-10-CM | POA: Insufficient documentation

## 2011-11-16 MED ORDER — PENICILLIN V POTASSIUM 500 MG PO TABS: 500.0000 mg | ORAL_TABLET | Freq: Three times a day (TID) | ORAL | Status: AC

## 2011-11-16 MED ORDER — LIDOCAINE VISCOUS 2 % MT SOLN: 20.0000 mL | OROMUCOSAL | Status: AC | PRN

## 2011-11-16 MED ORDER — HYDROCODONE-ACETAMINOPHEN 7.5-500 MG/15ML PO SOLN: 15.0000 mL | Freq: Two times a day (BID) | ORAL | Status: AC | PRN

## 2011-11-16 NOTE — ED Provider Notes (Signed)
Medical screening examination/treatment/procedure(s) were performed by non-physician practitioner and as supervising physician I was immediately available for consultation/collaboration.  Lugene Hitt R. Andrell Bergeson, MD 11/16/11 1552 

## 2011-11-16 NOTE — ED Notes (Signed)
Pt escorted to discharge window, in no distress. 

## 2011-11-16 NOTE — ED Notes (Signed)
Pt presenting to ed with c/o sore throat pain and discomfort x 9 days. Pt states she was seen here on the 5th for the same but her throat is not any better

## 2011-11-16 NOTE — ED Notes (Signed)
Pt states she was seen here on the 5th for sore throat, diagnosed w/ laryngitis, sent home w/ cough medication w/ codeine, states the L side of her throat is still sore, states it hurts between the back of her tongue and her tonsil. States still coughing up some green mucus.

## 2011-11-16 NOTE — ED Provider Notes (Signed)
History     CSN: [id number]  Arrival date & time 11/16/11  6578   First MD Initiated Contact with Patient 11/16/11 1008      Chief Complaint  Patient presents with  . Sore Throat    (Consider location/radiation/quality/duration/timing/severity/associated sxs/prior treatment) HPI  Patient presents to the ER with complaints of a sore throat for 9 days. She says that she was seen on the 5th of August but her throat is not any better, her cough has improved. She has not taken any antibiotics for these symptoms. She denies the pain being unilateral, she is handling her secretions and denies a change in her voice. She denies fevers, chills, nausea, weakness, vomiting, diarrhea.  Past Medical History  Diagnosis Date  . Learning disorder   . Seizure disorder   . Personal history of colonic polyps 05/12/2010    TUBULAR ADENOMA  . Abdominal pain, left upper quadrant   . Hiatal hernia   . Abdominal pain, left lower quadrant   . Unspecified constipation   . Abdominal pain, left upper quadrant   . Abdominal pain, epigastric   . Wheezing   . Chest pain, unspecified   . Fever, unspecified   . Attention deficit disorder with hyperactivity   . Elevated blood pressure reading without diagnosis of hypertension   . Depressive disorder, not elsewhere classified   . Anxiety state, unspecified   . Other convulsions   . Backache, unspecified   . Chest pain, unspecified   . Acute sinusitis, unspecified   . Other and unspecified hyperlipidemia     Past Surgical History  Procedure Date  . Cesarean section     X 2    Family History  Problem Relation Age of Onset  . Stroke Father   . Hypertension Father   . Gout Father   . Hyperlipidemia Father   . Alcohol abuse      grandfather  . Breast cancer      grandmother  . Hypertension      grandmother  . Asthma Mother   . Asthma      grandmother  . Colon cancer Neg Hx     History  Substance Use Topics  . Smoking status: Never Smoker    . Smokeless tobacco: Never Used  . Alcohol Use: No    OB History    Grav Para Term Preterm Abortions TAB SAB Ect Mult Living                  Review of Systems   HEENT: denies blurry vision or change in hearing PULMONARY: Denies difficulty breathing and SOB CARDIAC: denies chest pain or heart palpitations MUSCULOSKELETAL:  denies being unable to ambulate ABDOMEN AL: denies abdominal pain GU: denies loss of bowel or urinary control NEURO: denies numbness and tingling in extremities SKIN: no new rashes PSYCH: patient denies anxiety or depression. NECK: Pt denies having neck pain or stiffness     Allergies  Review of patient's allergies indicates no known allergies.  Home Medications   Current Outpatient Rx  Name Route Sig Dispense Refill  . AMPHETAMINE-DEXTROAMPHET ER 30 MG PO CP24 Oral Take 30 mg by mouth every morning.    Marland Kitchen HYDROCOD POLST-CPM POLST ER 10-8 MG/5ML PO LQCR Oral Take 5 mLs by mouth every 12 (twelve) hours as needed. For cough    . IBUPROFEN 800 MG PO TABS Oral Take 1 tablet (800 mg total) by mouth every 8 (eight) hours as needed for pain. 15 tablet 0  .  LEVONORGESTREL-ETHINYL ESTRAD 0.1-20 MG-MCG PO TABS Oral Take 1 tablet by mouth daily.    . AMPHETAMINE-DEXTROAMPHET ER 30 MG PO CP24 Oral Take 1 capsule (30 mg total) by mouth every morning. 30 capsule 0  . ESCITALOPRAM OXALATE 10 MG PO TABS Oral Take 1 tablet (10 mg total) by mouth daily. 90 tablet 3  . HYDROCODONE-ACETAMINOPHEN 7.5-500 MG/15ML PO SOLN Oral Take 15 mLs by mouth 2 (two) times daily as needed for pain. 60 mL 0  . LIDOCAINE VISCOUS 2 % MT SOLN Oral Take 20 mLs by mouth as needed for pain. 100 mL 0  . PENICILLIN V POTASSIUM 500 MG PO TABS Oral Take 1 tablet (500 mg total) by mouth 3 (three) times daily. 30 tablet 0    BP 115/73  Pulse 71  Temp 98.3 F (36.8 C) (Oral)  Resp 18  SpO2 97%  LMP 10/17/2011  Physical Exam  Nursing note and vitals reviewed. Constitutional: She is oriented  to person, place, and time. She appears well-developed and well-nourished. No distress.  HENT:  Head: Normocephalic and atraumatic. No trismus in the jaw.  Right Ear: Tympanic membrane, external ear and ear canal normal.  Left Ear: Tympanic membrane, external ear and ear canal normal.  Nose: Nose normal. No rhinorrhea. Right sinus exhibits no maxillary sinus tenderness and no frontal sinus tenderness. Left sinus exhibits no maxillary sinus tenderness and no frontal sinus tenderness.  Mouth/Throat: Uvula is midline and mucous membranes are normal. Normal dentition. No dental abscesses or uvula swelling. Oropharyngeal exudate and posterior oropharyngeal edema present. No posterior oropharyngeal erythema or tonsillar abscesses.       No submental edema, tongue not elevated, no trismus. No impending airway obstruction; Pt able to speak full sentences, swallow intact, no drooling, stridor, or tonsillar/uvula displacement. No palatal petechia  Eyes: Conjunctivae are normal.  Neck: Trachea normal, normal range of motion and full passive range of motion without pain. Neck supple. No rigidity. Erythema present. Normal range of motion present. No Brudzinski's sign noted.       Flexion and extension of neck without pain or difficulty. Able to breath without difficulty in extension.  Cardiovascular: Normal rate and regular rhythm.   Pulmonary/Chest: Effort normal and breath sounds normal. No stridor. No respiratory distress. She has no wheezes.  Abdominal: Soft. There is no tenderness.       No obvious evidence of splenomegaly. Non ttp.   Musculoskeletal: Normal range of motion.  Lymphadenopathy:       Head (right side): No preauricular and no posterior auricular adenopathy present.       Head (left side): No preauricular and no posterior auricular adenopathy present.    She has cervical adenopathy.  Neurological: She is alert and oriented to person, place, and time.  Skin: Skin is warm and dry. No rash  noted. She is not diaphoretic.  Psychiatric: She has a normal mood and affect.    ED Course  Procedures (including critical care time)   Labs Reviewed  RAPID STREP SCREEN   No results found.   1. Pharyngitis       MDM  Rx penicillin and pain medications  Pt given referral to ENT and informed that she needs to be seen by a specialist if she continues to have pain. She may possibly need her tonsils removed.  Pt has been advised of the symptoms that warrant their return to the ED. Patient has voiced understanding and has agreed to follow-up with the PCP or specialist.  Dorthula Matas, PA 11/16/11 1040

## 2011-11-18 ENCOUNTER — Ambulatory Visit: Payer: BC Managed Care – PPO | Admitting: Internal Medicine

## 2011-11-18 DIAGNOSIS — Z0289 Encounter for other administrative examinations: Secondary | ICD-10-CM

## 2011-11-30 ENCOUNTER — Other Ambulatory Visit: Payer: Self-pay | Admitting: Otolaryngology

## 2012-02-28 ENCOUNTER — Telehealth: Payer: Self-pay

## 2012-02-28 NOTE — Telephone Encounter (Signed)
Pharmacy and patient requesting hardcopy of Methylphenidate 10 mg

## 2012-02-28 NOTE — Telephone Encounter (Signed)
Called the patient left message to call back 

## 2012-02-28 NOTE — Telephone Encounter (Signed)
Most recent med has been adderall  Due to the nature of the medication as controlled substance, we normally do not change meds by phone

## 2012-02-29 NOTE — Telephone Encounter (Signed)
Called the patient several times both numbers unable to leave message.

## 2012-02-29 NOTE — Telephone Encounter (Signed)
Called informed the daughter to have the patient call our office as cell number does not work.

## 2012-03-09 ENCOUNTER — Ambulatory Visit (INDEPENDENT_AMBULATORY_CARE_PROVIDER_SITE_OTHER): Payer: BC Managed Care – PPO | Admitting: Internal Medicine

## 2012-03-09 ENCOUNTER — Encounter: Payer: Self-pay | Admitting: Internal Medicine

## 2012-03-09 VITALS — BP 110/78 | HR 82 | Temp 98.4°F | Ht 64.0 in | Wt 194.5 lb

## 2012-03-09 DIAGNOSIS — J45901 Unspecified asthma with (acute) exacerbation: Secondary | ICD-10-CM

## 2012-03-09 DIAGNOSIS — F909 Attention-deficit hyperactivity disorder, unspecified type: Secondary | ICD-10-CM

## 2012-03-09 DIAGNOSIS — Z0000 Encounter for general adult medical examination without abnormal findings: Secondary | ICD-10-CM

## 2012-03-09 DIAGNOSIS — J309 Allergic rhinitis, unspecified: Secondary | ICD-10-CM

## 2012-03-09 DIAGNOSIS — J209 Acute bronchitis, unspecified: Secondary | ICD-10-CM

## 2012-03-09 MED ORDER — PREDNISONE 10 MG PO TABS: ORAL_TABLET | ORAL | Status: DC

## 2012-03-09 MED ORDER — METHYLPREDNISOLONE ACETATE 80 MG/ML IJ SUSP
120.0000 mg | Freq: Once | INTRAMUSCULAR | Status: AC
  Administered 2012-03-09: 120 mg via INTRAMUSCULAR

## 2012-03-09 MED ORDER — METHYLPHENIDATE HCL 10 MG PO TABS: 10.0000 mg | ORAL_TABLET | Freq: Three times a day (TID) | ORAL | Status: DC

## 2012-03-09 MED ORDER — ALBUTEROL SULFATE HFA 108 (90 BASE) MCG/ACT IN AERS: 2.0000 | INHALATION_SPRAY | Freq: Four times a day (QID) | RESPIRATORY_TRACT | Status: DC | PRN

## 2012-03-09 NOTE — Patient Instructions (Addendum)
You had the steroid shot today Take all new medications as prescribed - the prednisone, and the inhaler as needed OK to take the Ritalin at 10 mg three times per day (with refills for 3 months in hardcopy) Continue all other medications as before Please have the pharmacy call with any other refills you may need. Thank you for enrolling in MyChart. Please follow the instructions below to securely access your online medical record. MyChart allows you to send messages to your doctor, view your test results, renew your prescriptions, schedule appointments, and more To Log into MyChart, please go to [email or web address], and your Username is:  [email or web address] Please return in 3 mo with Lab testing done 3-5 days before

## 2012-03-10 ENCOUNTER — Encounter: Payer: Self-pay | Admitting: Internal Medicine

## 2012-03-10 DIAGNOSIS — J309 Allergic rhinitis, unspecified: Secondary | ICD-10-CM | POA: Insufficient documentation

## 2012-03-10 DIAGNOSIS — J45901 Unspecified asthma with (acute) exacerbation: Secondary | ICD-10-CM | POA: Insufficient documentation

## 2012-03-10 NOTE — Assessment & Plan Note (Addendum)
New onset, for depomedrol IM and predpack asd, declines PFT's, cxr today,  to f/u any worsening symptoms or concerns, for inhaler prn

## 2012-03-10 NOTE — Assessment & Plan Note (Signed)
Ok for change to ritalin asd, refills done today

## 2012-03-10 NOTE — Assessment & Plan Note (Signed)
Mild, new dx, for allegra otc prn, as well as tx o/w as documented

## 2012-03-10 NOTE — Assessment & Plan Note (Signed)
Mild to mod, for antibx course,  to f/u any worsening symptoms or concerns 

## 2012-03-10 NOTE — Progress Notes (Signed)
Subjective:    Patient ID: Kellie Curtis, female    DOB: 1970-09-11, 41 y.o.   MRN: [id number]  HPI  Here to f/u; overall doing ok, has no fever but Does have several wks ongoing nasal allergy symptoms with clear congestion, itch and sneeze, without fever, pain, ST, but also with worsening nonprod cough and wheezing /sob in the past 2-3 days.  Pt denies chest pain, orthopnea, PND, increased LE swelling, palpitations, dizziness or syncope.  Pt denies new neurological symptoms such as new headache, or facial or extremity weakness or numbness   Pt denies polydipsia, polyuria,  Pt states overall good compliance with meds, trying to follow lower cholesterol, wt overall stable but little exercise however. Has ongoing ADD symptoms, has recently tried adderall, but finds ritalin better for her with respect to concentration and task completeion. Past Medical History  Diagnosis Date  . Learning disorder   . Seizure disorder   . Personal history of colonic polyps 05/12/2010    TUBULAR ADENOMA  . Abdominal pain, left upper quadrant   . Hiatal hernia   . Abdominal pain, left lower quadrant   . Unspecified constipation   . Abdominal pain, left upper quadrant   . Abdominal pain, epigastric   . Wheezing   . Chest pain, unspecified   . Fever, unspecified   . Attention deficit disorder with hyperactivity   . Elevated blood pressure reading without diagnosis of hypertension   . Depressive disorder, not elsewhere classified   . Anxiety state, unspecified   . Other convulsions   . Backache, unspecified   . Chest pain, unspecified   . Acute sinusitis, unspecified   . Other and unspecified hyperlipidemia    Past Surgical History  Procedure Date  . Cesarean section     X 2    reports that she has never smoked. She has never used smokeless tobacco. She reports that she does not drink alcohol or use illicit drugs. family history includes Alcohol abuse in an unspecified family member; Asthma in her mother and  unspecified family member; Breast cancer in an unspecified family member; Gout in her father; Hyperlipidemia in her father; Hypertension in her father and unspecified family member; and Stroke in her father.  There is no history of Colon cancer. No Known Allergies Current Outpatient Prescriptions on File Prior to Visit  Medication Sig Dispense Refill  . albuterol (PROVENTIL HFA;VENTOLIN HFA) 108 (90 BASE) MCG/ACT inhaler Inhale 2 puffs into the lungs every 6 (six) hours as needed for wheezing.  1 Inhaler  4  . amphetamine-dextroamphetamine (ADDERALL XR, 30MG ,) 30 MG 24 hr capsule Take 1 capsule (30 mg total) by mouth every morning.  30 capsule  0  . escitalopram (LEXAPRO) 10 MG tablet Take 1 tablet (10 mg total) by mouth daily.  90 tablet  3  . levonorgestrel-ethinyl estradiol (AVIANE,ALESSE,LESSINA) 0.1-20 MG-MCG tablet Take 1 tablet by mouth daily.       Review of Systems  Constitutional: Negative for diaphoresis and unexpected weight change.  HENT: Negative for tinnitus.   Eyes: Negative for photophobia and visual disturbance.  Respiratory: Negative for choking and stridor.   Gastrointestinal: Negative for vomiting and blood in stool.  Genitourinary: Negative for hematuria and decreased urine volume.  Musculoskeletal: Negative for gait problem.  Skin: Negative for color change and wound.  Neurological: Negative for tremors and numbness.  Psychiatric/Behavioral: Negative for decreased concentration. The patient is not hyperactive.       Objective:   Physical Exam BP 110/78  Pulse 82  Temp 98.4 F (36.9 C) (Oral)  Ht 5\' 4"  (1.626 m)  Wt 194 lb 8 oz (88.225 kg)  BMI 33.39 kg/m2  SpO2 96% Physical Exam  VS noted Constitutional: Pt appears well-developed and well-nourished.  HENT: Head: Normocephalic.  Right Ear: External ear normal.  Left Ear: External ear normal.  Bilat tm's mild erythema.  Sinus nontender.  Pharynx mild erythema   Eyes: Conjunctivae and EOM are normal. Pupils  are equal, round, and reactive to light.  Neck: Normal range of motion. Neck supple.  Cardiovascular: Normal rate and regular rhythm.   Pulmonary/Chest: Effort normal and breath sounds decreased with mild bilat wheezing Neurological: Pt is alert. Not confused  Skin: Skin is warm. No erythema.  Psychiatric: Pt behavior is normal. Thought content normal.     Assessment & Plan:

## 2012-05-14 ENCOUNTER — Ambulatory Visit: Payer: BC Managed Care – PPO | Admitting: Internal Medicine

## 2012-05-16 ENCOUNTER — Ambulatory Visit: Payer: BC Managed Care – PPO | Admitting: Internal Medicine

## 2012-05-19 ENCOUNTER — Other Ambulatory Visit: Payer: Self-pay

## 2012-05-25 ENCOUNTER — Ambulatory Visit: Payer: BC Managed Care – PPO | Admitting: Internal Medicine

## 2012-06-01 ENCOUNTER — Encounter: Payer: Self-pay | Admitting: Internal Medicine

## 2012-06-01 ENCOUNTER — Ambulatory Visit (INDEPENDENT_AMBULATORY_CARE_PROVIDER_SITE_OTHER): Payer: BC Managed Care – PPO | Admitting: Internal Medicine

## 2012-06-01 ENCOUNTER — Ambulatory Visit: Payer: BC Managed Care – PPO | Admitting: Internal Medicine

## 2012-06-01 VITALS — BP 104/78 | HR 74 | Temp 97.8°F | Ht 64.0 in | Wt 193.1 lb

## 2012-06-01 DIAGNOSIS — E785 Hyperlipidemia, unspecified: Secondary | ICD-10-CM

## 2012-06-01 DIAGNOSIS — F411 Generalized anxiety disorder: Secondary | ICD-10-CM

## 2012-06-01 DIAGNOSIS — F909 Attention-deficit hyperactivity disorder, unspecified type: Secondary | ICD-10-CM

## 2012-06-01 MED ORDER — AMPHETAMINE-DEXTROAMPHETAMINE 20 MG PO TABS: ORAL_TABLET | ORAL | Status: DC

## 2012-06-01 NOTE — Assessment & Plan Note (Signed)
stable overall by history and exam, recent data reviewed with pt, and pt to continue medical treatment as before,  to f/u any worsening symptoms or concerns Lab Results  Component Value Date   WBC 5.9 05/30/2011   HGB 12.9 05/30/2011   HCT 39.3 05/30/2011   PLT 322.0 05/30/2011   GLUCOSE 102* 05/30/2011   CHOL 207* 05/30/2011   TRIG 183.0* 05/30/2011   HDL 108.50 05/30/2011   LDLDIRECT 79.6 05/30/2011   LDLCALC 90 04/28/2010   ALT 16 05/30/2011   AST 16 05/30/2011   NA 138 05/30/2011   K 4.3 05/30/2011   CL 102 05/30/2011   CREATININE 0.8 05/30/2011   BUN 12 05/30/2011   CO2 29 05/30/2011   TSH 1.25 05/30/2011   INR 1.0 02/11/2008

## 2012-06-01 NOTE — Assessment & Plan Note (Addendum)
ECG reviewed as per emr, cont diet, stable overall by history and exam, recent data reviewed with pt, and pt to continue medical treatment as before,  to f/u any worsening symptoms or concerns Lab Results  Component Value Date   LDLCALC 90 04/28/2010

## 2012-06-01 NOTE — Assessment & Plan Note (Signed)
Ok to change to adderal 20 bid,  to f/u any worsening symptoms or concerns

## 2012-06-01 NOTE — Progress Notes (Signed)
Subjective:    Patient ID: Kellie Curtis, female    DOB: 02-08-1971, 42 y.o.   MRN: [id number]  HPI  Here to f/u; overall doing ok,  Pt denies chest pain, increased sob or doe, wheezing, orthopnea, PND, increased LE swelling, palpitations, dizziness or syncope.  Pt denies polydipsia, polyuria, or low sugar symptoms such as weakness or confusion improved with po intake.  Pt denies new neurological symptoms such as new headache, or facial or extremity weakness or numbness.   Pt states overall good compliance with meds, has been trying to follow lower cholesterol diet, with wt overall stable,  but little exercise however.  Current ADHD med not working well, as she is getting in trouble with team leader, too much talking, not enough focus and not enough getting work done.   Pt denies fever, wt loss, night sweats, loss of appetite, or other constitutional symptoms. Denies worsening depressive symptoms, suicidal ideation, or panic; Past Medical History  Diagnosis Date  . Learning disorder   . Seizure disorder   . Personal history of colonic polyps 05/12/2010    TUBULAR ADENOMA  . Abdominal pain, left upper quadrant   . Hiatal hernia   . Abdominal pain, left lower quadrant   . Unspecified constipation   . Abdominal pain, left upper quadrant   . Abdominal pain, epigastric   . Wheezing   . Chest pain, unspecified   . Fever, unspecified   . Attention deficit disorder with hyperactivity   . Elevated blood pressure reading without diagnosis of hypertension   . Depressive disorder, not elsewhere classified   . Anxiety state, unspecified   . Other convulsions   . Backache, unspecified   . Chest pain, unspecified   . Acute sinusitis, unspecified   . Other and unspecified hyperlipidemia    Past Surgical History  Procedure Laterality Date  . Cesarean section      X 2    reports that she has never smoked. She has never used smokeless tobacco. She reports that she does not drink alcohol or use illicit  drugs. family history includes Alcohol abuse in an unspecified family member; Asthma in her mother and unspecified family member; Breast cancer in an unspecified family member; Gout in her father; Hyperlipidemia in her father; Hypertension in her father and unspecified family member; and Stroke in her father.  There is no history of Colon cancer. No Known Allergies Current Outpatient Prescriptions on File Prior to Visit  Medication Sig Dispense Refill  . albuterol (PROVENTIL HFA;VENTOLIN HFA) 108 (90 BASE) MCG/ACT inhaler Inhale 2 puffs into the lungs every 6 (six) hours as needed for wheezing.  1 Inhaler  4  . levonorgestrel-ethinyl estradiol (AVIANE,ALESSE,LESSINA) 0.1-20 MG-MCG tablet Take 1 tablet by mouth daily.      . methylphenidate (RITALIN) 10 MG tablet Take 1 tablet (10 mg total) by mouth 3 (three) times daily. To fill May 08, 2012  90 tablet  0  . escitalopram (LEXAPRO) 10 MG tablet Take 1 tablet (10 mg total) by mouth daily.  90 tablet  3   No current facility-administered medications on file prior to visit.   Review of Systems  Constitutional: Negative for unexpected weight change, or unusual diaphoresis  HENT: Negative for tinnitus.   Eyes: Negative for photophobia and visual disturbance.  Respiratory: Negative for choking and stridor.   Gastrointestinal: Negative for vomiting and blood in stool.  Genitourinary: Negative for hematuria and decreased urine volume.  Musculoskeletal: Negative for acute joint swelling Skin: Negative for color  change and wound.  Neurological: Negative for tremors and numbness other than noted  Psychiatric/Behavioral: Negative for decreased concentration or  hyperactivity.       Objective:   Physical Exam BP 104/78  Pulse 74  Temp(Src) 97.8 F (36.6 C) (Oral)  Ht 5\' 4"  (1.626 m)  Wt 193 lb 2 oz (87.601 kg)  BMI 33.13 kg/m2  SpO2 99% VS noted,  Constitutional: Pt appears well-developed and well-nourished.  HENT: Head: NCAT.  Right Ear:  External ear normal.  Left Ear: External ear normal.  Eyes: Conjunctivae and EOM are normal. Pupils are equal, round, and reactive to light.  Neck: Normal range of motion. Neck supple.  Cardiovascular: Normal rate and regular rhythm.   Pulmonary/Chest: Effort normal and breath sounds normal.  Neurological: Pt is alert. Not confused  Skin: Skin is warm. No erythema.  Psychiatric: Pt behavior is normal. Thought content normal. 1+ nervous    Assessment & Plan:

## 2012-06-01 NOTE — Patient Instructions (Addendum)
Your EKG was OK today OK to stop the ritalin Please take all new medication as prescribed - the adderall Please let us know on Mychart how you are doing in 1 month Please continue all other medications as before Thank you for enrolling in MyChart. Please follow the instructions below to securely access your online medical record. MyChart allows you to send messages to your doctor, view your test results, renew your prescriptions, schedule appointments, and more.  OK to cancel the Mar 14 appointment  Please return in 6 months, or sooner if needed

## 2012-06-15 ENCOUNTER — Ambulatory Visit: Payer: BC Managed Care – PPO | Admitting: Internal Medicine

## 2012-06-18 ENCOUNTER — Telehealth: Payer: Self-pay

## 2012-06-18 NOTE — Telephone Encounter (Signed)
PA for Adderall was approved from 06/18/12 through 06/18/13.  Case # [id number].  Pharmacy and patient have been informed.

## 2012-07-02 ENCOUNTER — Other Ambulatory Visit: Payer: Self-pay

## 2012-07-02 MED ORDER — AMPHETAMINE-DEXTROAMPHETAMINE 20 MG PO TABS: ORAL_TABLET | ORAL | Status: DC

## 2012-07-02 NOTE — Telephone Encounter (Signed)
Done hardcopy to robin  

## 2012-07-02 NOTE — Telephone Encounter (Signed)
Pt is requesting 3 mth refill

## 2012-07-02 NOTE — Telephone Encounter (Signed)
Called the patient left detailed message on both cell and home number that hardcopy's are ready for pickup at the front desk.

## 2012-09-30 ENCOUNTER — Emergency Department (HOSPITAL_COMMUNITY): Payer: BC Managed Care – PPO

## 2012-09-30 ENCOUNTER — Emergency Department (HOSPITAL_COMMUNITY)
Admission: EM | Admit: 2012-09-30 | Discharge: 2012-09-30 | Disposition: A | Discharge status: Home / Self Care | Payer: BC Managed Care – PPO | Attending: Emergency Medicine | Admitting: Emergency Medicine

## 2012-09-30 ENCOUNTER — Encounter (HOSPITAL_COMMUNITY): Payer: Self-pay | Admitting: Emergency Medicine

## 2012-09-30 DIAGNOSIS — Z8659 Personal history of other mental and behavioral disorders: Secondary | ICD-10-CM | POA: Insufficient documentation

## 2012-09-30 DIAGNOSIS — R079 Chest pain, unspecified: Secondary | ICD-10-CM

## 2012-09-30 DIAGNOSIS — B368 Other specified superficial mycoses: Secondary | ICD-10-CM | POA: Insufficient documentation

## 2012-09-30 DIAGNOSIS — F329 Major depressive disorder, single episode, unspecified: Secondary | ICD-10-CM | POA: Insufficient documentation

## 2012-09-30 DIAGNOSIS — R05 Cough: Secondary | ICD-10-CM | POA: Insufficient documentation

## 2012-09-30 DIAGNOSIS — R21 Rash and other nonspecific skin eruption: Secondary | ICD-10-CM | POA: Insufficient documentation

## 2012-09-30 DIAGNOSIS — F3289 Other specified depressive episodes: Secondary | ICD-10-CM | POA: Insufficient documentation

## 2012-09-30 DIAGNOSIS — Z3202 Encounter for pregnancy test, result negative: Secondary | ICD-10-CM | POA: Insufficient documentation

## 2012-09-30 DIAGNOSIS — R0789 Other chest pain: Secondary | ICD-10-CM

## 2012-09-30 DIAGNOSIS — R109 Unspecified abdominal pain: Secondary | ICD-10-CM | POA: Insufficient documentation

## 2012-09-30 DIAGNOSIS — Z860100 Personal history of colon polyps, unspecified: Secondary | ICD-10-CM | POA: Insufficient documentation

## 2012-09-30 DIAGNOSIS — G40909 Epilepsy, unspecified, not intractable, without status epilepticus: Secondary | ICD-10-CM | POA: Insufficient documentation

## 2012-09-30 DIAGNOSIS — Z8719 Personal history of other diseases of the digestive system: Secondary | ICD-10-CM | POA: Insufficient documentation

## 2012-09-30 DIAGNOSIS — Z79899 Other long term (current) drug therapy: Secondary | ICD-10-CM | POA: Insufficient documentation

## 2012-09-30 DIAGNOSIS — B369 Superficial mycosis, unspecified: Secondary | ICD-10-CM

## 2012-09-30 DIAGNOSIS — R112 Nausea with vomiting, unspecified: Secondary | ICD-10-CM | POA: Insufficient documentation

## 2012-09-30 DIAGNOSIS — Z8709 Personal history of other diseases of the respiratory system: Secondary | ICD-10-CM | POA: Insufficient documentation

## 2012-09-30 DIAGNOSIS — F411 Generalized anxiety disorder: Secondary | ICD-10-CM | POA: Insufficient documentation

## 2012-09-30 DIAGNOSIS — R071 Chest pain on breathing: Secondary | ICD-10-CM | POA: Insufficient documentation

## 2012-09-30 DIAGNOSIS — R059 Cough, unspecified: Secondary | ICD-10-CM | POA: Insufficient documentation

## 2012-09-30 HISTORY — DX: Irritable bowel syndrome, unspecified: K58.9

## 2012-09-30 LAB — COMPREHENSIVE METABOLIC PANEL
ALT: 9 U/L (ref 0–35)
AST: 15 U/L (ref 0–37)
Albumin: 3.6 g/dL (ref 3.5–5.2)
Alkaline Phosphatase: 33 U/L — ABNORMAL LOW (ref 39–117)
BUN: 9 mg/dL (ref 6–23)
CO2: 27 mEq/L (ref 19–32)
Calcium: 9.1 mg/dL (ref 8.4–10.5)
Chloride: 102 mEq/L (ref 96–112)
Creatinine, Ser: 0.73 mg/dL (ref 0.50–1.10)
GFR calc Af Amer: >90 mL/min (ref >90)
GFR calc non Af Amer: >90 mL/min (ref >90)
Glucose, Bld: 93 mg/dL (ref 70–99)
Potassium: 4 mEq/L (ref 3.5–5.1)
Sodium: 138 mEq/L (ref 135–145)
Total Bilirubin: 0.6 mg/dL (ref 0.3–1.2)
Total Protein: 7.1 g/dL (ref 6.0–8.3)

## 2012-09-30 LAB — CBC
HCT: 34.5 % — ABNORMAL LOW (ref 36.0–46.0)
Hemoglobin: 11 g/dL — ABNORMAL LOW (ref 12.0–15.0)
MCH: 28.6 pg (ref 26.0–34.0)
MCHC: 31.9 g/dL (ref 30.0–36.0)
MCV: 89.6 fL (ref 78.0–100.0)
Platelets: 263 10*3/uL (ref 150–400)
RBC: 3.85 MIL/uL — ABNORMAL LOW (ref 3.87–5.11)
RDW: 13.8 % (ref 11.5–15.5)
WBC: 4.6 10*3/uL (ref 4.0–10.5)

## 2012-09-30 LAB — TROPONIN I: Troponin I: <0.3 ng/mL (ref <0.30)

## 2012-09-30 LAB — URINALYSIS, ROUTINE W REFLEX MICROSCOPIC
Bilirubin Urine: NEGATIVE
Glucose, UA: NEGATIVE mg/dL
Ketones, ur: NEGATIVE mg/dL
Leukocytes, UA: NEGATIVE
pH: 7 (ref 5.0–8.0)

## 2012-09-30 LAB — URINE MICROSCOPIC-ADD ON

## 2012-09-30 LAB — LIPASE, BLOOD: Lipase: 25 U/L (ref 11–59)

## 2012-09-30 MED ORDER — ONDANSETRON 8 MG PO TBDP
8.0000 mg | ORAL_TABLET | Freq: Once | ORAL | Status: AC
  Administered 2012-09-30: 8 mg via ORAL
  Filled 2012-09-30: qty 1

## 2012-09-30 MED ORDER — CLOTRIMAZOLE 1 % EX CREA: TOPICAL_CREAM | CUTANEOUS | Status: DC

## 2012-09-30 MED ORDER — IBUPROFEN 200 MG PO TABS
400.0000 mg | ORAL_TABLET | Freq: Once | ORAL | Status: AC
  Administered 2012-09-30: 400 mg via ORAL
  Filled 2012-09-30: qty 2

## 2012-09-30 MED ORDER — HYDROCODONE-ACETAMINOPHEN 5-325 MG PO TABS: 1.0000 | ORAL_TABLET | Freq: Four times a day (QID) | ORAL | Status: DC | PRN

## 2012-09-30 NOTE — ED Notes (Signed)
Patient transported to X-ray 

## 2012-09-30 NOTE — ED Provider Notes (Signed)
History    CSN: [id number] Arrival date & time 09/30/12  4098  First MD Initiated Contact with Patient 09/30/12 1002     Chief Complaint  Patient presents with  . Chest Pain  . Abdominal Pain  . Rash  . Nausea  . Emesis   (Consider location/radiation/quality/duration/timing/severity/associated sxs/prior Treatment) Patient is a 42 y.o. female presenting with chest pain, abdominal pain, and rash. The history is provided by the patient.  Chest Pain Associated symptoms: cough   Associated symptoms: no abdominal pain, no back pain, no fever, no headache, no shortness of breath and not vomiting   Abdominal Pain Associated symptoms include chest pain. Pertinent negatives include no abdominal pain, no headaches and no shortness of breath.  Rash Associated symptoms: chest pain and cough   Associated symptoms: no chills, no fever, no shortness of breath, no vaginal bleeding, no vaginal discharge and no vomiting   pt c/o chest pain and abd pain for the past month. Constant. States dull pain mid chest, worse w certain movements and occasional non productive cough. No associated sob, nv or diaphoresis. Also notes has hx irritable bowel, and has dull mid to left abd pain. Crampy. No specific exacerbating or alleviating factors. States that she intermittent problems w constipation, and that sometimes she will need to 'drink a beer' in order to have good bm. Had normal bm yesterday. No acute or abrupt change in patients pain or symptoms today. No abd distension or nv. Denies vaginal discharge or bleeding, lnmp 3 weeks ago. Denies any recent change in meds or new meds. No dysuria or gu c/o. No fever or chills. No back/flank pain. Pt also c/o itchy, erythematous rash to bil groin for past 1-2 months, had tried some antifungal cream of friends w partial relief. No other skin lesions or rash.    Past Medical History  Diagnosis Date  . Learning disorder   . Seizure disorder   . Personal history of  colonic polyps 05/12/2010    TUBULAR ADENOMA  . Abdominal pain, left upper quadrant   . Hiatal hernia   . Abdominal pain, left lower quadrant   . Unspecified constipation   . Abdominal pain, left upper quadrant   . Abdominal pain, epigastric   . Wheezing   . Chest pain, unspecified   . Fever, unspecified   . Attention deficit disorder with hyperactivity(314.01)   . Elevated blood pressure reading without diagnosis of hypertension   . Depressive disorder, not elsewhere classified   . Anxiety state, unspecified   . Other convulsions   . Backache, unspecified   . Chest pain, unspecified   . Acute sinusitis, unspecified   . Other and unspecified hyperlipidemia   . IBS (irritable bowel syndrome)    Past Surgical History  Procedure Laterality Date  . Cesarean section      X 2  . Tonsillectomy     Family History  Problem Relation Age of Onset  . Stroke Father   . Hypertension Father   . Gout Father   . Hyperlipidemia Father   . Alcohol abuse      grandfather  . Breast cancer      grandmother  . Hypertension      grandmother  . Asthma Mother   . Asthma      grandmother  . Colon cancer Neg Hx    History  Substance Use Topics  . Smoking status: Never Smoker   . Smokeless tobacco: Never Used  . Alcohol Use: 1.2 oz/week  2 Cans of beer per week     Comment: per week   OB History   Grav Para Term Preterm Abortions TAB SAB Ect Mult Living                 Review of Systems  Constitutional: Negative for fever and chills.  HENT: Negative for neck pain.   Eyes: Negative for redness.  Respiratory: Positive for cough. Negative for shortness of breath.   Cardiovascular: Positive for chest pain. Negative for leg swelling.  Gastrointestinal: Negative for vomiting and abdominal pain.  Genitourinary: Negative for flank pain, vaginal bleeding and vaginal discharge.  Musculoskeletal: Negative for back pain.  Skin: Positive for rash.  Neurological: Negative for headaches.   Hematological: Does not bruise/bleed easily.  Psychiatric/Behavioral: Negative for confusion.    Allergies  Review of patient's allergies indicates no known allergies.  Home Medications   Current Outpatient Rx  Name  Route  Sig  Dispense  Refill  . albuterol (PROVENTIL HFA;VENTOLIN HFA) 108 (90 BASE) MCG/ACT inhaler   Inhalation   Inhale 2 puffs into the lungs every 6 (six) hours as needed for wheezing.   1 Inhaler   4   . amphetamine-dextroamphetamine (ADDERALL) 20 MG tablet      1 tab by mouth twice per day in the AM, then at 2 pm, to fill may 28   60 tablet   0   . EXPIRED: escitalopram (LEXAPRO) 10 MG tablet   Oral   Take 1 tablet (10 mg total) by mouth daily.   90 tablet   3   . levonorgestrel-ethinyl estradiol (AVIANE,ALESSE,LESSINA) 0.1-20 MG-MCG tablet   Oral   Take 1 tablet by mouth daily.         . methylphenidate (RITALIN) 10 MG tablet   Oral   Take 1 tablet (10 mg total) by mouth 3 (three) times daily. To fill May 08, 2012   90 tablet   0    BP 153/101  Pulse 73  Temp(Src) 98.3 F (36.8 C) (Oral)  Resp 19  SpO2 100%  LMP 09/04/2012 Physical Exam  Nursing note and vitals reviewed. Constitutional: She is oriented to person, place, and time. She appears well-developed and well-nourished. No distress.  HENT:  Mouth/Throat: Oropharynx is clear and moist.  Eyes: Conjunctivae are normal. No scleral icterus.  Neck: Neck supple. No tracheal deviation present. No thyromegaly present.  Cardiovascular: Normal rate, regular rhythm, normal heart sounds and intact distal pulses.  Exam reveals no gallop and no friction rub.   No murmur heard. Pulmonary/Chest: Effort normal and breath sounds normal. No respiratory distress. She exhibits tenderness.  Abdominal: Soft. Normal appearance and bowel sounds are normal. She exhibits no distension and no mass. There is no tenderness. There is no rebound and no guarding.  No incarc hernia.   Genitourinary:  No cva  tenderness  Musculoskeletal: She exhibits no edema and no tenderness.  Neurological: She is alert and oriented to person, place, and time.  Skin: Skin is warm and dry.  Mild erythema, w few satellite type lesions bil groin creases c/w fungal dermatitis.   Psychiatric: She has a normal mood and affect.    ED Course  Procedures (including critical care time)  Results for orders placed during the hospital encounter of 09/30/12  PREGNANCY, URINE      Result Value Range   Preg Test, Ur NEGATIVE  NEGATIVE  CBC      Result Value Range   WBC 4.6  4.0 -  10.5 K/uL   RBC 3.85 (*) 3.87 - 5.11 MIL/uL   Hemoglobin 11.0 (*) 12.0 - 15.0 g/dL   HCT 16.1 (*) 09.6 - 04.5 %   MCV 89.6  78.0 - 100.0 fL   MCH 28.6  26.0 - 34.0 pg   MCHC 31.9  30.0 - 36.0 g/dL   RDW 40.9  81.1 - 91.4 %   Platelets 263  150 - 400 K/uL  COMPREHENSIVE METABOLIC PANEL      Result Value Range   Sodium 138  135 - 145 mEq/L   Potassium 4.0  3.5 - 5.1 mEq/L   Chloride 102  96 - 112 mEq/L   CO2 27  19 - 32 mEq/L   Glucose, Bld 93  70 - 99 mg/dL   BUN 9  6 - 23 mg/dL   Creatinine, Ser 7.82  0.50 - 1.10 mg/dL   Calcium 9.1  8.4 - 95.6 mg/dL   Total Protein 7.1  6.0 - 8.3 g/dL   Albumin 3.6  3.5 - 5.2 g/dL   AST 15  0 - 37 U/L   ALT 9  0 - 35 U/L   Alkaline Phosphatase 33 (*) 39 - 117 U/L   Total Bilirubin 0.6  0.3 - 1.2 mg/dL   GFR calc non Af Amer >90  >90 mL/min   GFR calc Af Amer >90  >90 mL/min  LIPASE, BLOOD      Result Value Range   Lipase 25  11 - 59 U/L  TROPONIN I      Result Value Range   Troponin I <0.30  <0.30 ng/mL  URINALYSIS, ROUTINE W REFLEX MICROSCOPIC      Result Value Range   Color, Urine YELLOW  YELLOW   APPearance CLOUDY (*) CLEAR   Specific Gravity, Urine 1.021  1.005 - 1.030   pH 7.0  5.0 - 8.0   Glucose, UA NEGATIVE  NEGATIVE mg/dL   Hgb urine dipstick TRACE (*) NEGATIVE   Bilirubin Urine NEGATIVE  NEGATIVE   Ketones, ur NEGATIVE  NEGATIVE mg/dL   Protein, ur NEGATIVE  NEGATIVE mg/dL    Urobilinogen, UA 0.2  0.0 - 1.0 mg/dL   Nitrite NEGATIVE  NEGATIVE   Leukocytes, UA NEGATIVE  NEGATIVE  URINE MICROSCOPIC-ADD ON      Result Value Range   Squamous Epithelial / LPF FEW (*) RARE   WBC, UA 0-2  <3 WBC/hpf   RBC / HPF 0-2  <3 RBC/hpf   Bacteria, UA FEW (*) RARE   Dg Chest 2 View  09/30/2012   *RADIOLOGY REPORT*  Clinical Data: Chest pain.  CHEST - 2 VIEW  Comparison: 02/16/2011.  Findings: The heart, mediastinum and hilar contours are normal. The lungs are clear.  There are no effusions or pneumothoraces. The bony thorax is normal.  IMPRESSION: Normal chest.   Original Report Authenticated By: Sander Radon, M.D.      MDM  Labs. Ecg. Cxr.  Reviewed nursing notes and prior charts for additional history.    Date: 09/30/2012  Rate: 69  Rhythm: normal sinus rhythm  QRS Axis: normal  Intervals: normal  ST/T Wave abnormalities: normal  Conduction Disutrbances:none  Narrative Interpretation:   Old EKG Reviewed: none available  Motrin po.  Recheck pt comfortable.   After symptoms constant x days, trop neg. cxr neg. +chest wall tenderness. Appears musculoskeletal cp.  Recheck abd soft nt.  Pt appears stable for d/c.      Suzi Roots, MD 09/30/12 1313

## 2012-09-30 NOTE — ED Notes (Signed)
Pt requesting to know how much longer and what plan of care is. Called EDP Steinl made him aware. States he will be going to speak with her.

## 2012-09-30 NOTE — ED Notes (Signed)
Pt states that she has been having intermittent chest pain that hurts when she moves or coughs.  Pt also states that her lower left abd pain that radiates to her left lower back started over a month ago but the pain has just gotten worse.  Pt also c/o rash on her bilat bikini line that her friend uses stuff down there the rash comes and when she uses her friends fungal cream the rash goes away.

## 2012-10-04 ENCOUNTER — Encounter: Payer: Self-pay | Admitting: Internal Medicine

## 2012-10-04 ENCOUNTER — Ambulatory Visit: Payer: BC Managed Care – PPO | Admitting: Internal Medicine

## 2012-10-04 ENCOUNTER — Ambulatory Visit (INDEPENDENT_AMBULATORY_CARE_PROVIDER_SITE_OTHER): Payer: BC Managed Care – PPO | Admitting: Internal Medicine

## 2012-10-04 VITALS — BP 110/80 | HR 71 | Temp 97.5°F | Ht 64.0 in | Wt 201.8 lb

## 2012-10-04 DIAGNOSIS — F909 Attention-deficit hyperactivity disorder, unspecified type: Secondary | ICD-10-CM

## 2012-10-04 DIAGNOSIS — D649 Anemia, unspecified: Secondary | ICD-10-CM

## 2012-10-04 DIAGNOSIS — R109 Unspecified abdominal pain: Secondary | ICD-10-CM

## 2012-10-04 DIAGNOSIS — K581 Irritable bowel syndrome with constipation: Secondary | ICD-10-CM

## 2012-10-04 DIAGNOSIS — K589 Irritable bowel syndrome without diarrhea: Secondary | ICD-10-CM

## 2012-10-04 DIAGNOSIS — F329 Major depressive disorder, single episode, unspecified: Secondary | ICD-10-CM

## 2012-10-04 DIAGNOSIS — K219 Gastro-esophageal reflux disease without esophagitis: Secondary | ICD-10-CM

## 2012-10-04 DIAGNOSIS — F3289 Other specified depressive episodes: Secondary | ICD-10-CM

## 2012-10-04 MED ORDER — OMEPRAZOLE 20 MG PO CPDR: 40.0000 mg | DELAYED_RELEASE_CAPSULE | Freq: Every day | ORAL | Status: DC

## 2012-10-04 MED ORDER — METHYLPHENIDATE HCL 10 MG PO TABS: 10.0000 mg | ORAL_TABLET | Freq: Three times a day (TID) | ORAL | Status: DC

## 2012-10-04 MED ORDER — LINACLOTIDE 145 MCG PO CAPS: 145.0000 ug | ORAL_CAPSULE | Freq: Every day | ORAL | Status: DC

## 2012-10-04 MED ORDER — ALBUTEROL SULFATE HFA 108 (90 BASE) MCG/ACT IN AERS: 2.0000 | INHALATION_SPRAY | Freq: Four times a day (QID) | RESPIRATORY_TRACT | Status: DC | PRN

## 2012-10-04 MED ORDER — IRON 325 (65 FE) MG PO TABS: 1.0000 | ORAL_TABLET | Freq: Every day | ORAL | Status: DC

## 2012-10-04 NOTE — Assessment & Plan Note (Signed)
Ok for trial linzess, f/u one mo

## 2012-10-04 NOTE — Progress Notes (Signed)
Subjective:    Patient ID: Kellie Curtis, female    DOB: 25-Mar-1971, 42 y.o.   MRN: [id number]  HPI  Here to f/u recent ER visit with CP but pt states today she was there forluq  abd pain as well but they would not listen to her about that, lab eval with new mild anemia, has been having recnet ? menorrhagia recently.  No n/v, wt loss, GI blood, but with pain mostly to mid and left upper abd with radiation to the back.  Suggested to her in the ER could have costochondritis per pt, ecg/cxr neg for acute. tx with hydrocodone for pain, did help.  Has hx of chronic GERd, IBS/chronic constipation and colon polyps, last egd and colon feb 2012 with GERD only and polyps, to f/u colon at 1 yrs per pt.  Recalls she was prescribed med per Dr Patterson/GI about feb 2012 but cant recall name, but eventually could not afford refills. Not taking PPI , or iron. Has also had worsening mild to mod depressive symptoms, but no suicidal ideation, or panic; has ongoing anxiety. Needs ritalin refill, does well for her. Plans to re-start counseling at [facility] related to hospice and father's recent death Past Medical History  Diagnosis Date  . Learning disorder   . Seizure disorder   . Personal history of colonic polyps 05/12/2010    TUBULAR ADENOMA  . Abdominal pain, left upper quadrant   . Hiatal hernia   . Abdominal pain, left lower quadrant   . Unspecified constipation   . Abdominal pain, left upper quadrant   . Abdominal pain, epigastric   . Wheezing   . Chest pain, unspecified   . Fever, unspecified   . Attention deficit disorder with hyperactivity(314.01)   . Elevated blood pressure reading without diagnosis of hypertension   . Depressive disorder, not elsewhere classified   . Anxiety state, unspecified   . Other convulsions   . Backache, unspecified   . Chest pain, unspecified   . Acute sinusitis, unspecified   . Other and unspecified hyperlipidemia   . IBS (irritable bowel syndrome)    Past Surgical  History  Procedure Laterality Date  . Cesarean section      X 2  . Tonsillectomy      reports that she has never smoked. She has never used smokeless tobacco. She reports that she drinks about 1.2 ounces of alcohol per week. She reports that she does not use illicit drugs. family history includes Alcohol abuse in an unspecified family member; Asthma in her mother and unspecified family member; Breast cancer in an unspecified family member; Gout in her father; Hyperlipidemia in her father; Hypertension in her father and unspecified family member; and Stroke in her father.  There is no history of Colon cancer. No Known Allergies Current Outpatient Prescriptions on File Prior to Visit  Medication Sig Dispense Refill  . clotrimazole (LOTRIMIN) 1 % cream Apply to affected area 2 times daily  15 g  0  . levonorgestrel-ethinyl estradiol (AVIANE,ALESSE,LESSINA) 0.1-20 MG-MCG tablet Take 1 tablet by mouth daily.       No current facility-administered medications on file prior to visit.   Review of Systems  Constitutional: Negative for unexpected weight change, or unusual diaphoresis  HENT: Negative for tinnitus.   Eyes: Negative for photophobia and visual disturbance.  Respiratory: Negative for choking and stridor.   Gastrointestinal: Negative for vomiting and blood in stool.  Genitourinary: Negative for hematuria and decreased urine volume.  Musculoskeletal: Negative for  acute joint swelling Skin: Negative for color change and wound.  Neurological: Negative for tremors and numbness other than noted  Psychiatric/Behavioral: Negative for decreased concentration or  hyperactivity.       Objective:   Physical Exam BP 110/80  Pulse 71  Temp(Src) 97.5 F (36.4 C) (Oral)  Ht 5\' 4"  (1.626 m)  Wt 201 lb 12 oz (91.513 kg)  BMI 34.61 kg/m2  SpO2 99%  LMP 09/04/2012 VS noted,  Constitutional: Pt appears well-developed and well-nourished.  HENT: Head: NCAT.  Right Ear: External ear normal.   Left Ear: External ear normal.  Eyes: Conjunctivae and EOM are normal. Pupils are equal, round, and reactive to light.  Neck: Normal range of motion. Neck supple.  Cardiovascular: Normal rate and regular rhythm.   Pulmonary/Chest: Effort normal and breath sounds normal.  Abd:  Soft, NT, non-distended, + BS except mild epigastric/luq tender wtihout guarding/rebound Neurological: Pt is alert. Not confused  Skin: Skin is warm. No erythema.  Psychiatric: Pt behavior is normal. Thought content normal. + depressed affect, 1+ nervous    Assessment & Plan:

## 2012-10-04 NOTE — Patient Instructions (Addendum)
Please take all new medication as prescribed  - the Linzess for the constipation, the omeprazole at 40 mg total per day for the reflux Please continue all other medications as before, and refills have been done if requested  - the ritalin, and the inhaler Please also start iron sulfate 325 mg per day  Please look into the counseling at [facility] as you mentioned Please return in 1 months, or sooner if needed, with Lab testing done 3-5 days before

## 2012-10-05 ENCOUNTER — Encounter: Payer: Self-pay | Admitting: Internal Medicine

## 2012-10-05 DIAGNOSIS — F329 Major depressive disorder, single episode, unspecified: Secondary | ICD-10-CM | POA: Insufficient documentation

## 2012-10-05 DIAGNOSIS — F32A Depression, unspecified: Secondary | ICD-10-CM

## 2012-10-05 HISTORY — DX: Depression, unspecified: F32.A

## 2012-10-05 NOTE — Assessment & Plan Note (Signed)
For PPI - prilosec 40 qd

## 2012-10-05 NOTE — Assessment & Plan Note (Signed)
decliens med tx,but for re-start counseling

## 2012-10-05 NOTE — Assessment & Plan Note (Signed)
Most likely functional, likely IBS/constipationn related, for linzess trial if can afford with coupon given today (no samples available)  Note:  Total time for pt hx, exam, review of record with pt in the room, determination of diagnoses and plan for further eval and tx is > 40 min, with over 50% spent in coordination and counseling of patient

## 2012-10-05 NOTE — Assessment & Plan Note (Signed)
For f/u cbc, iron 325 mg daily

## 2012-10-05 NOTE — Assessment & Plan Note (Signed)
Stable, for med refill 

## 2012-11-30 ENCOUNTER — Ambulatory Visit: Payer: BC Managed Care – PPO | Admitting: Internal Medicine

## 2012-11-30 DIAGNOSIS — Z0289 Encounter for other administrative examinations: Secondary | ICD-10-CM

## 2012-12-26 ENCOUNTER — Telehealth: Payer: Self-pay | Admitting: *Deleted

## 2012-12-26 MED ORDER — CLOTRIMAZOLE 1 % EX CREA: TOPICAL_CREAM | CUTANEOUS | Status: DC

## 2012-12-26 MED ORDER — METHYLPHENIDATE HCL 10 MG PO TABS: 10.0000 mg | ORAL_TABLET | Freq: Three times a day (TID) | ORAL | Status: DC

## 2012-12-26 NOTE — Telephone Encounter (Signed)
Pt called requesting Ritalin refill and cream to apply to rash.  Please advise

## 2012-12-26 NOTE — Telephone Encounter (Signed)
Called to inform but mailbox was full.

## 2012-12-26 NOTE — Telephone Encounter (Signed)
Called the patient numerous times and mailbox full and no answer.

## 2012-12-26 NOTE — Telephone Encounter (Signed)
Cream [facility]  O/w Done hardcopy to robin

## 2012-12-27 MED ORDER — METHYLPHENIDATE HCL 10 MG PO TABS: 10.0000 mg | ORAL_TABLET | Freq: Three times a day (TID) | ORAL | Status: DC

## 2012-12-27 NOTE — Telephone Encounter (Signed)
Patient informed. 

## 2012-12-27 NOTE — Telephone Encounter (Signed)
The patient did pickup her prescription of ritalin, BUT stated you always do it for 3 months.  Patient did take the rx for today to  Be filled Oct. 1, would like to return later today to pickup rx for Nov. And Dec. Refills.

## 2012-12-27 NOTE — Telephone Encounter (Signed)
Called the patient informed of MD instructions and to pickup hardcopy at the front desk.

## 2012-12-27 NOTE — Addendum Note (Signed)
Addended by: Corwin Levins on: 12/27/2012 08:29 AM   Modules accepted: Orders

## 2012-12-27 NOTE — Telephone Encounter (Signed)
Done hardcopy to robin  Please ask pt to always request 3 mo as the default is one month unless we are otherwise asked

## 2013-01-11 ENCOUNTER — Encounter: Payer: Self-pay | Admitting: Internal Medicine

## 2013-01-11 ENCOUNTER — Ambulatory Visit (INDEPENDENT_AMBULATORY_CARE_PROVIDER_SITE_OTHER)
Admission: RE | Admit: 2013-01-11 | Discharge: 2013-01-11 | Disposition: A | Discharge status: Home / Self Care | Payer: BC Managed Care – PPO | Source: Ambulatory Visit | Attending: Internal Medicine | Admitting: Internal Medicine

## 2013-01-11 ENCOUNTER — Ambulatory Visit (INDEPENDENT_AMBULATORY_CARE_PROVIDER_SITE_OTHER): Payer: BC Managed Care – PPO | Admitting: Internal Medicine

## 2013-01-11 VITALS — BP 100/74 | HR 69 | Temp 99.2°F | Ht 64.0 in | Wt 191.0 lb

## 2013-01-11 DIAGNOSIS — R079 Chest pain, unspecified: Secondary | ICD-10-CM

## 2013-01-11 DIAGNOSIS — J209 Acute bronchitis, unspecified: Secondary | ICD-10-CM

## 2013-01-11 DIAGNOSIS — R21 Rash and other nonspecific skin eruption: Secondary | ICD-10-CM

## 2013-01-11 MED ORDER — KETOCONAZOLE 200 MG PO TABS: 200.0000 mg | ORAL_TABLET | Freq: Every day | ORAL | Status: DC

## 2013-01-11 MED ORDER — AZITHROMYCIN 250 MG PO TABS: ORAL_TABLET | ORAL | Status: DC

## 2013-01-11 MED ORDER — HYDROCODONE-HOMATROPINE 5-1.5 MG/5ML PO SYRP: 5.0000 mL | ORAL_SOLUTION | Freq: Four times a day (QID) | ORAL | Status: DC | PRN

## 2013-01-11 NOTE — Patient Instructions (Signed)
Please take all new medication as prescribed - the antibiotic, and cough medicine Please continue all other medications as before, and refills have been done if requested. Please have the pharmacy call with any other refills you may need. Your EKG was OK today Please go to the XRAY Department in the Basement (go straight as you get off the elevator) for the x-ray testing You will be contacted by phone if any changes need to be made immediately.  Otherwise, you will receive a letter about your results with an explanation, but please check with MyChart first.  Please remember to sign up for My Chart if you have not done so, as this will be important to you in the future with finding out test results, communicating by private email, and scheduling acute appointments online when needed.

## 2013-01-11 NOTE — Progress Notes (Signed)
Subjective:    Patient ID: Kellie Curtis, female    DOB: [birth year], 42 y.o.   MRN: [id number]  HPI   Here with acute onset mild to mod 2-3 days ST, HA, general weakness and malaise, with prod cough greenish sputum, but Pt denies increased sob or doe, wheezing, orthopnea, PND, increased LE swelling, palpitations, dizziness or syncope.  Has had sharp anterior diffuse chest pains with coughing, non exertional, nonpleuritic or positional for 2 days.  Antacid no help.  Son ill recently with similar as well.  Also mentions rash to left > right groin some better with antifungal cream but seems to come back as soon as slows up on application frequency.   Past Medical History  Diagnosis Date  . Learning disorder   . Seizure disorder   . Personal history of colonic polyps 05/12/2010    TUBULAR ADENOMA  . Abdominal pain, left upper quadrant   . Hiatal hernia   . Abdominal pain, left lower quadrant   . Unspecified constipation   . Abdominal pain, left upper quadrant   . Abdominal pain, epigastric   . Wheezing   . Chest pain, unspecified   . Fever, unspecified   . Attention deficit disorder with hyperactivity(314.01)   . Elevated blood pressure reading without diagnosis of hypertension   . Depressive disorder, not elsewhere classified   . Anxiety state, unspecified   . Other convulsions   . Backache, unspecified   . Chest pain, unspecified   . Acute sinusitis, unspecified   . Other and unspecified hyperlipidemia   . IBS (irritable bowel syndrome)   . Depression 10/05/2012   Past Surgical History  Procedure Laterality Date  . Cesarean section      X 2  . Tonsillectomy      reports that she has never smoked. She has never used smokeless tobacco. She reports that she drinks about 1.2 ounces of alcohol per week. She reports that she does not use illicit drugs. family history includes Alcohol abuse in an other family member; Asthma in her mother and another family member; Breast cancer in an other  family member; Gout in her father; Hyperlipidemia in her father; Hypertension in her father and another family member; Stroke in her father. There is no history of Colon cancer. No Known Allergies Current Outpatient Prescriptions on File Prior to Visit  Medication Sig Dispense Refill  . albuterol (PROVENTIL HFA;VENTOLIN HFA) 108 (90 BASE) MCG/ACT inhaler Inhale 2 puffs into the lungs every 6 (six) hours as needed for wheezing.  1 Inhaler  11  . clotrimazole (LOTRIMIN) 1 % cream Apply to affected area 2 times daily  15 g  0  . Ferrous Sulfate (IRON) 325 (65 FE) MG TABS Take 1 tablet by mouth daily.  30 each  5  . levonorgestrel-ethinyl estradiol (AVIANE,ALESSE,LESSINA) 0.1-20 MG-MCG tablet Take 1 tablet by mouth daily.      . Linaclotide (LINZESS) 145 MCG CAPS Take 1 capsule (145 mcg total) by mouth daily.  30 capsule  11  . methylphenidate (RITALIN) 10 MG tablet Take 1 tablet (10 mg total) by mouth 3 (three) times daily. To fill Mar 03, 2013  90 tablet  0  . omeprazole (PRILOSEC) 20 MG capsule Take 2 capsules (40 mg total) by mouth daily.  180 capsule  3   No current facility-administered medications on file prior to visit.   Review of Systems  Constitutional: Negative for unexpected weight change, or unusual diaphoresis  HENT: Negative for tinnitus.   Eyes:  Negative for photophobia and visual disturbance.  Respiratory: Negative for choking and stridor.   Gastrointestinal: Negative for vomiting and blood in stool.  Genitourinary: Negative for hematuria and decreased urine volume.  Musculoskeletal: Negative for acute joint swelling Skin: Negative for color change and wound.  Neurological: Negative for tremors and numbness other than noted  Psychiatric/Behavioral: Negative for decreased concentration or  hyperactivity.       Objective:   Physical Exam BP 100/74  Pulse 69  Temp(Src) 99.2 F (37.3 C) (Oral)  Ht 5\' 4"  (1.626 m)  Wt 191 lb (86.637 kg)  BMI 32.77 kg/m2  SpO2 98%  LMP  12/25/2012 VS noted, mild ill Constitutional: Pt appears well-developed and well-nourished.  HENT: Head: NCAT.  Right Ear: External ear normal.  Left Ear: External ear normal.  Bilat tm's with mild erythema.  Max sinus areas non tender.  Pharynx with mild erythema, no exudate Eyes: Conjunctivae and EOM are normal. Pupils are equal, round, and reactive to light.  Neck: Normal range of motion. Neck supple.  Cardiovascular: Normal rate and regular rhythm.   Pulmonary/Chest: Effort normal and breath sounds normal.  Abd:  Soft, NT, non-distended, + BS Neurological: Pt is alert. Not confused  Skin: bilat groin with mild intertrigo with overlapping skin folds Psychiatric: Pt behavior is normal. Thought content normal.     Assessment & Plan:

## 2013-01-11 NOTE — Assessment & Plan Note (Addendum)
ECG reviewed as per emr, c/w msk most likely,  to f/u any worsening symptoms or concerns

## 2013-01-14 NOTE — Assessment & Plan Note (Signed)
Mild to mod, for antibx course,  to f/u any worsening symptoms or concerns 

## 2013-01-14 NOTE — Assessment & Plan Note (Signed)
Ok for [employer] tx after tx for acute illness resolved

## 2013-02-07 ENCOUNTER — Other Ambulatory Visit: Payer: Self-pay

## 2013-05-03 ENCOUNTER — Encounter: Payer: Self-pay | Admitting: Internal Medicine

## 2013-05-03 ENCOUNTER — Ambulatory Visit (INDEPENDENT_AMBULATORY_CARE_PROVIDER_SITE_OTHER): Payer: BC Managed Care – PPO | Admitting: Internal Medicine

## 2013-05-03 VITALS — BP 110/80 | HR 80 | Temp 98.5°F | Ht 64.0 in | Wt 186.0 lb

## 2013-05-03 DIAGNOSIS — F909 Attention-deficit hyperactivity disorder, unspecified type: Secondary | ICD-10-CM

## 2013-05-03 DIAGNOSIS — F411 Generalized anxiety disorder: Secondary | ICD-10-CM

## 2013-05-03 DIAGNOSIS — J209 Acute bronchitis, unspecified: Secondary | ICD-10-CM

## 2013-05-03 MED ORDER — HYDROCODONE-HOMATROPINE 5-1.5 MG/5ML PO SYRP: 5.0000 mL | ORAL_SOLUTION | Freq: Four times a day (QID) | ORAL | Status: DC | PRN

## 2013-05-03 MED ORDER — METHYLPHENIDATE HCL 10 MG PO TABS: 10.0000 mg | ORAL_TABLET | Freq: Three times a day (TID) | ORAL | Status: DC

## 2013-05-03 MED ORDER — LEVOFLOXACIN 250 MG PO TABS: 250.0000 mg | ORAL_TABLET | Freq: Every day | ORAL | Status: DC

## 2013-05-03 NOTE — Assessment & Plan Note (Signed)
Mild situational worsening today, to cont same meds, reassured

## 2013-05-03 NOTE — Progress Notes (Signed)
Pre-visit discussion using our clinic review tool. No additional management support is needed unless otherwise documented below in the visit note.  

## 2013-05-03 NOTE — Patient Instructions (Signed)
Please take all new medication as prescribed - the antibiotic, and cough med Please continue all other medications as before, and refills have been done if requested  - the ritalin x 3 mo Please have the pharmacy call with any other refills you may need.  Please remember to sign up for My Chart if you have not done so, as this will be important to you in the future with finding out test results, communicating by private email, and scheduling acute appointments online when needed.

## 2013-05-03 NOTE — Assessment & Plan Note (Signed)
Mild to mod, for antibx course,  to f/u any worsening symptoms or concerns 

## 2013-05-03 NOTE — Assessment & Plan Note (Signed)
Stable for med refills todya

## 2013-05-03 NOTE — Progress Notes (Signed)
Subjective:    Patient ID: Kellie Curtis, female    DOB: [birth year], 43 y.o.   MRN: [id number]  HPI  Here with acute onset mild to mod 2-3 days ST, HA, general weakness and malaise, with prod cough greenish sputum and slight BRB noted, with occas wheeze to mid upper chest onlyand ant chest soreness with cough, but Pt denies other chest pain, increased sob or doe, other wheezing, orthopnea, PND, increased LE swelling, palpitations, dizziness or syncope. Pt denies new neurological symptoms such as new headache, or facial or extremity weakness or numbness  Also c/o mild flare of mid LBP with coughing as well but no bowel or bladder change, loss,  worsening LE pain/numbness/weakness, gait change or falls. Due for ritalin refill as well - doing well with good effect on concentration, task completion.  Denies worsening depressive symptoms, suicidal ideation, or panic  Past Medical History  Diagnosis Date  . Learning disorder   . Seizure disorder   . Personal history of colonic polyps 05/12/2010    TUBULAR ADENOMA  . Abdominal pain, left upper quadrant   . Hiatal hernia   . Abdominal pain, left lower quadrant   . Unspecified constipation   . Abdominal pain, left upper quadrant   . Abdominal pain, epigastric   . Wheezing   . Chest pain, unspecified   . Fever, unspecified   . Attention deficit disorder with hyperactivity(314.01)   . Elevated blood pressure reading without diagnosis of hypertension   . Depressive disorder, not elsewhere classified   . Anxiety state, unspecified   . Other convulsions   . Backache, unspecified   . Chest pain, unspecified   . Acute sinusitis, unspecified   . Other and unspecified hyperlipidemia   . IBS (irritable bowel syndrome)   . Depression 10/05/2012   Past Surgical History  Procedure Laterality Date  . Cesarean section      X 2  . Tonsillectomy      reports that she has never smoked. She has never used smokeless tobacco. She reports that she drinks  about 1.2 ounces of alcohol per week. She reports that she does not use illicit drugs. family history includes Alcohol abuse in an other family member; Asthma in her mother and another family member; Breast cancer in an other family member; Gout in her father; Hyperlipidemia in her father; Hypertension in her father and another family member; Stroke in her father. There is no history of Colon cancer. No Known Allergies Current Outpatient Prescriptions on File Prior to Visit  Medication Sig Dispense Refill  . albuterol (PROVENTIL HFA;VENTOLIN HFA) 108 (90 BASE) MCG/ACT inhaler Inhale 2 puffs into the lungs every 6 (six) hours as needed for wheezing.  1 Inhaler  11  . clotrimazole (LOTRIMIN) 1 % cream Apply to affected area 2 times daily  15 g  0  . Ferrous Sulfate (IRON) 325 (65 FE) MG TABS Take 1 tablet by mouth daily.  30 each  5  . HYDROcodone-homatropine (HYCODAN) 5-1.5 MG/5ML syrup Take 5 mLs by mouth every 6 (six) hours as needed for cough.  120 mL  0  . ketoconazole (NIZORAL) 200 MG tablet Take 1 tablet (200 mg total) by mouth daily. To take after the current treatment with zpack is finished  7 tablet  0  . levonorgestrel-ethinyl estradiol (AVIANE,ALESSE,LESSINA) 0.1-20 MG-MCG tablet Take 1 tablet by mouth daily.      . Linaclotide (LINZESS) 145 MCG CAPS Take 1 capsule (145 mcg total) by mouth daily.  [place]  capsule  11  . methylphenidate (RITALIN) 10 MG tablet Take 1 tablet (10 mg total) by mouth 3 (three) times daily. To fill Mar 03, 2013  90 tablet  0  . omeprazole (PRILOSEC) 20 MG capsule Take 2 capsules (40 mg total) by mouth daily.  180 capsule  3   No current facility-administered medications on file prior to visit.   Review of Systems  Constitutional: Negative for unexpected weight change, or unusual diaphoresis  HENT: Negative for tinnitus.   Eyes: Negative for photophobia and visual disturbance.  Respiratory: Negative for choking and stridor.   Gastrointestinal: Negative for  vomiting and blood in stool.  Genitourinary: Negative for hematuria and decreased urine volume.  Musculoskeletal: Negative for acute joint swelling Skin: Negative for color change and wound.  Neurological: Negative for tremors and numbness other than noted  Psychiatric/Behavioral: Negative for decreased concentration or  hyperactivity.       Objective:   Physical Exam BP 110/80  Pulse 80  Temp(Src) 98.5 F (36.9 C) (Oral)  Ht 5\' 4"  (1.626 m)  Wt 186 lb (84.369 kg)  BMI 31.91 kg/m2  SpO2 97% VS noted, mild ill  Constitutional: Pt appears well-developed and well-nourished.  HENT: Head: NCAT.  Right Ear: External ear normal.  Left Ear: External ear normal.  Eyes: Conjunctivae and EOM are normal. Pupils are equal, round, and reactive to light.  Neck: Normal range of motion. Neck supple.  Cardiovascular: Normal rate and regular rhythm.   Pulmonary/Chest: Effort normal and breath sounds decresaed but no frank wheeze or rales  Neurological: Pt is alert. Not confused  Skin: Skin is warm. No erythema.  Psychiatric: Pt behavior is normal. Thought content normal. mild nervous Spine nontender    Assessment & Plan:

## 2013-05-06 ENCOUNTER — Ambulatory Visit: Payer: BC Managed Care – PPO | Admitting: Emergency Medicine

## 2013-05-06 ENCOUNTER — Telehealth: Payer: Self-pay | Admitting: *Deleted

## 2013-05-06 VITALS — BP 122/78 | HR 83 | Temp 98.5°F | Resp 17 | Ht 65.0 in | Wt 188.0 lb

## 2013-05-06 DIAGNOSIS — J209 Acute bronchitis, unspecified: Secondary | ICD-10-CM

## 2013-05-06 NOTE — Patient Instructions (Signed)

## 2013-05-06 NOTE — Telephone Encounter (Signed)
Matheny for note; to robin to handle

## 2013-05-06 NOTE — Progress Notes (Signed)
[facility] [address], [place] [address] [phone]  Date:  05/06/2013   Name:  Kellie Curtis   DOB:  September 12, 1970   MRN:  [id number]  PCP:  Cathlean Cower, MD    Chief Complaint: Shortness of Breath, Cough and URI   History of Present Illness:  Kellie Curtis is a 43 y.o. very pleasant female patient who presents with the following:  Ill since Thursday. Seen by [facility] and put on levaquin and tussionex on Friday.  Has a fever.  Cough productive of purulent sputum.  No vomiting but nauseated with medication.  Too weak to go to the office.  Has nasal congestion and sometimes a mucopurulent drainage.  No stool change, rash.  No improvement with over the counter medications or other home remedies. Denies other complaint or health concern today.   Patient Active Problem List   Diagnosis Date Noted  . Acute bronchitis 01/11/2013  . Chest pain 01/11/2013  . Anemia, unspecified 10/04/2012  . Allergic rhinitis, cause unspecified 03/10/2012  . Irregular menses 05/30/2011  . Irritable bowel syndrome with constipation 05/30/2011  . Preventative health care 05/28/2011  . GERD 05/25/2010  . HIATAL HERNIA WITH REFLUX 05/21/2010  . COLONIC POLYPS, HX OF 05/21/2010  . CONSTIPATION 04/27/2010  . ANXIETY 03/14/2008  . DEPRESSION 03/14/2008  . ADHD 03/14/2008  . SEIZURE DISORDER 02/13/2008  . HYPERLIPIDEMIA 02/07/2008  . BACK PAIN 02/07/2008    Past Medical History  Diagnosis Date  . Learning disorder   . Seizure disorder   . Personal history of colonic polyps 05/12/2010    TUBULAR ADENOMA  . Abdominal pain, left upper quadrant   . Hiatal hernia   . Abdominal pain, left lower quadrant   . Unspecified constipation   . Abdominal pain, left upper quadrant   . Abdominal pain, epigastric   . Wheezing   . Chest pain, unspecified   . Fever, unspecified   . Attention deficit disorder with hyperactivity(314.01)   . Elevated blood pressure reading without diagnosis of  hypertension   . Depressive disorder, not elsewhere classified   . Anxiety state, unspecified   . Other convulsions   . Backache, unspecified   . Chest pain, unspecified   . Acute sinusitis, unspecified   . Other and unspecified hyperlipidemia   . IBS (irritable bowel syndrome)   . Depression 10/05/2012  . Anemia     Past Surgical History  Procedure Laterality Date  . Cesarean section      X 2  . Tonsillectomy      History  Substance Use Topics  . Smoking status: Never Smoker   . Smokeless tobacco: Never Used  . Alcohol Use: 1.2 oz/week    2 Cans of beer per week     Comment: per week    Family History  Problem Relation Age of Onset  . Stroke Father   . Hypertension Father   . Gout Father   . Hyperlipidemia Father   . Alcohol abuse      grandfather  . Breast cancer      grandmother  . Hypertension      grandmother  . Asthma Mother   . Asthma      grandmother  . Colon cancer Neg Hx     No Known Allergies  Medication list has been reviewed and updated.  Current Outpatient Prescriptions on File Prior to Visit  Medication Sig Dispense Refill  . HYDROcodone-homatropine (HYCODAN) 5-1.5 MG/5ML syrup Take 5 mLs by  mouth every 6 (six) hours as needed for cough.  180 mL  0  . levofloxacin (LEVAQUIN) 250 MG tablet Take 1 tablet (250 mg total) by mouth daily.  10 tablet  0  . levonorgestrel-ethinyl estradiol (AVIANE,ALESSE,LESSINA) 0.1-20 MG-MCG tablet Take 1 tablet by mouth daily.      . Linaclotide (LINZESS) 145 MCG CAPS Take 1 capsule (145 mcg total) by mouth daily.  30 capsule  11  . methylphenidate (RITALIN) 10 MG tablet Take 1 tablet (10 mg total) by mouth 3 (three) times daily. To fill Jul 01, 2013  90 tablet  0  . omeprazole (PRILOSEC) 20 MG capsule Take 2 capsules (40 mg total) by mouth daily.  180 capsule  3   No current facility-administered medications on file prior to visit.    Review of Systems:  As per HPI, otherwise negative.    Physical  Examination: Filed Vitals:   05/06/13 0812  BP: 122/78  Pulse: 83  Temp: 98.5 F (36.9 C)  Resp: 17   Filed Vitals:   05/06/13 0812  Height: 5\' 5"  (1.651 m)  Weight: 188 lb (85.276 kg)   Body mass index is 31.28 kg/(m^2). Ideal Body Weight: Weight in (lb) to have BMI = 25: 149.9  GEN: WDWN, NAD, Non-toxic, A & O x 3 HEENT: Atraumatic, Normocephalic. Neck supple. No masses, No LAD. Ears and Nose: No external deformity. CV: RRR, No M/G/R. No JVD. No thrill. No extra heart sounds. PULM: CTA B, no wheezes, crackles, rhonchi. No retractions. No resp. distress. No accessory muscle use. ABD: S, NT, ND, +BS. No rebound. No HSM. EXTR: No c/c/e NEURO Normal gait.  PSYCH: Normally interactive. Conversant. Not depressed or anxious appearing.  Calm demeanor.    Assessment and Plan: Bronchitis Continue meds  Off work Rest   Signed,  Ellison Carwin, MD

## 2013-05-06 NOTE — Telephone Encounter (Signed)
Patient phoned requesting a note for work.  States she was out of work Saturday, Sunday, and today.  Last OV with PCP 05/03/2013.  Please advise.  CB# [phone]

## 2013-05-07 NOTE — Telephone Encounter (Signed)
Called the patient to pickup note.  She stated she is better, but still coughing up mucous and sob.  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  No fever at this time,  Advise please

## 2013-05-07 NOTE — Telephone Encounter (Signed)
Please have pt see Kellie Curtis/NP asap if ok with pt

## 2013-05-08 NOTE — Telephone Encounter (Signed)
Patient informed and did schedule appointment

## 2013-05-08 NOTE — Telephone Encounter (Signed)
Called the patient twice, no answer and mail box was full.

## 2013-05-09 ENCOUNTER — Ambulatory Visit: Payer: BC Managed Care – PPO | Admitting: Internal Medicine

## 2013-05-10 ENCOUNTER — Encounter: Payer: Self-pay | Admitting: Internal Medicine

## 2013-05-10 ENCOUNTER — Ambulatory Visit (INDEPENDENT_AMBULATORY_CARE_PROVIDER_SITE_OTHER): Payer: BC Managed Care – PPO | Admitting: Internal Medicine

## 2013-05-10 ENCOUNTER — Ambulatory Visit (INDEPENDENT_AMBULATORY_CARE_PROVIDER_SITE_OTHER)
Admission: RE | Admit: 2013-05-10 | Discharge: 2013-05-10 | Disposition: A | Discharge status: Home / Self Care | Payer: BC Managed Care – PPO | Source: Ambulatory Visit | Attending: Internal Medicine | Admitting: Internal Medicine

## 2013-05-10 VITALS — BP 102/60 | HR 72 | Temp 97.7°F | Ht 64.0 in | Wt 189.5 lb

## 2013-05-10 DIAGNOSIS — R042 Hemoptysis: Secondary | ICD-10-CM

## 2013-05-10 DIAGNOSIS — F909 Attention-deficit hyperactivity disorder, unspecified type: Secondary | ICD-10-CM

## 2013-05-10 DIAGNOSIS — F411 Generalized anxiety disorder: Secondary | ICD-10-CM

## 2013-05-10 NOTE — Progress Notes (Signed)
Subjective:    Patient ID: Kellie Curtis, female    DOB: February 19, 1971, 43 y.o.   MRN: [id number]  HPI  Here to f/u, was able to go back to work yesterday (out from jan 31 to feb 5), but unfort still with ongoing cough, and yesterday with several cough now much less prod but still with small BRB; no fever, overall cough freq is less, but also states persistent mid ant sharp and dull CP as well, no wheezing.  Pt denies increased sob or doe, wheezing, orthopnea, PND, increased LE swelling, palpitations, dizziness or syncope.  Did see UC feb 2, felt to be improving.  Pt has not had to use her rx for inhaler as did not feel she needed it (also expensive so did not fill). 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 Denies worsening depressive symptoms, suicidal ideation, or panic Past Medical History  Diagnosis Date  . Learning disorder   . Seizure disorder   . Personal history of colonic polyps 05/12/2010    TUBULAR ADENOMA  . Abdominal pain, left upper quadrant   . Hiatal hernia   . Abdominal pain, left lower quadrant   . Unspecified constipation   . Abdominal pain, left upper quadrant   . Abdominal pain, epigastric   . Wheezing   . Chest pain, unspecified   . Fever, unspecified   . Attention deficit disorder with hyperactivity(314.01)   . Elevated blood pressure reading without diagnosis of hypertension   . Depressive disorder, not elsewhere classified   . Anxiety state, unspecified   . Other convulsions   . Backache, unspecified   . Chest pain, unspecified   . Acute sinusitis, unspecified   . Other and unspecified hyperlipidemia   . IBS (irritable bowel syndrome)   . Depression 10/05/2012  . Anemia    Past Surgical History  Procedure Laterality Date  . Cesarean section      X 2  . Tonsillectomy      reports that she has never smoked. She has never used smokeless tobacco. She reports that she drinks about 1.2 ounces of alcohol  per week. She reports that she does not use illicit drugs. family history includes Alcohol abuse in an other family member; Asthma in her mother and another family member; Breast cancer in an other family member; Gout in her father; Hyperlipidemia in her father; Hypertension in her father and another family member; Stroke in her father. There is no history of Colon cancer. No Known Allergies Current Outpatient Prescriptions on File Prior to Visit  Medication Sig Dispense Refill  . HYDROcodone-homatropine (HYCODAN) 5-1.5 MG/5ML syrup Take 5 mLs by mouth every 6 (six) hours as needed for cough.  180 mL  0  . levonorgestrel-ethinyl estradiol (AVIANE,ALESSE,LESSINA) 0.1-20 MG-MCG tablet Take 1 tablet by mouth daily.      . Linaclotide (LINZESS) 145 MCG CAPS Take 1 capsule (145 mcg total) by mouth daily.  30 capsule  11  . methylphenidate (RITALIN) 10 MG tablet Take 1 tablet (10 mg total) by mouth 3 (three) times daily. To fill Jul 01, 2013  90 tablet  0  . omeprazole (PRILOSEC) 20 MG capsule Take 2 capsules (40 mg total) by mouth daily.  180 capsule  3   No current facility-administered medications on file prior to visit.   Review of Systems  Constitutional: Negative for unexpected weight change, or unusual diaphoresis  HENT: Negative for tinnitus.   Eyes: Negative for  photophobia and visual disturbance.  Respiratory: Negative for choking and stridor.   Gastrointestinal: Negative for vomiting and blood in stool.  Genitourinary: Negative for hematuria and decreased urine volume.  Musculoskeletal: Negative for acute joint swelling Skin: Negative for color change and wound.  Neurological: Negative for tremors and numbness other than noted  Psychiatric/Behavioral: Negative for decreased concentration or  hyperactivity.       Objective:   Physical Exam BP 102/60  Pulse 72  Temp(Src) 97.7 F (36.5 C) (Oral)  Ht 5\' 4"  (1.626 m)  Wt 189 lb 8 oz (85.957 kg)  BMI 32.51 kg/m2  SpO2 99%  LMP  05/06/2013 VS noted, not ill appearing Constitutional: Pt appears well-developed and well-nourished.  HENT: Head: NCAT.  Right Ear: External ear normal.  Left Ear: External ear normal.  Eyes: Conjunctivae and EOM are normal. Pupils are equal, round, and reactive to light.  Mild pharyngeal erythema only Neck: Normal range of motion. Neck supple.  Cardiovascular: Normal rate and regular rhythm.   Pulmonary/Chest: Effort normal and breath sounds normal.  Neurological: Pt is alert. Not confused  Skin: Skin is warm. No erythema.  Psychiatric: Pt behavior is normal. Thought content normal.     Assessment & Plan:

## 2013-05-10 NOTE — Assessment & Plan Note (Signed)
Small volume, afeb, likely post bronchitis but cant r/o other; last cxr oct 2014 neg for acute, will repeat today, if neg, would follow expectant management; work note given today

## 2013-05-10 NOTE — Progress Notes (Signed)
Pre-visit discussion using our clinic review tool. No additional management support is needed unless otherwise documented below in the visit note.  

## 2013-05-10 NOTE — Assessment & Plan Note (Signed)
Mild situational increased, ok to cont current meds, reassured

## 2013-05-10 NOTE — Assessment & Plan Note (Signed)
Has not taken her ritalin in last few days, reassured she can take this, especially with being back to work would be to her benefit

## 2013-05-10 NOTE — Patient Instructions (Signed)
Please continue all other medications as before, and refills have been done if requested. Please have the pharmacy call with any other refills you may need.  Please go to the XRAY Department in the Basement (go straight as you get off the elevator) for the x-ray testing You will be contacted by phone if any changes need to be made immediately.  Otherwise, you will receive a letter about your results with an explanation, but please check with MyChart first.  You are given the work note today

## 2013-06-06 ENCOUNTER — Encounter: Payer: Self-pay | Admitting: Internal Medicine

## 2013-07-17 ENCOUNTER — Telehealth: Payer: Self-pay | Admitting: Internal Medicine

## 2013-07-17 MED ORDER — METHYLPHENIDATE HCL 10 MG PO TABS: 10.0000 mg | ORAL_TABLET | Freq: Three times a day (TID) | ORAL | Status: DC

## 2013-07-17 NOTE — Telephone Encounter (Signed)
Pt need a refill on ADD medicine.  She is not sure of the name.  She lost one of the RX's.  She thinks it is time to refill.  She thinks it is Ritalin.  She can't read the label on the bottle. Call her when it is ready.

## 2013-07-17 NOTE — Telephone Encounter (Signed)
Done hardcopy to robin  

## 2013-07-18 NOTE — Telephone Encounter (Signed)
Called the patient left detailed message hardcopy is ready for pickup at the front desk 

## 2013-07-25 ENCOUNTER — Telehealth: Payer: Self-pay | Admitting: Internal Medicine

## 2013-07-25 ENCOUNTER — Encounter: Payer: Self-pay | Admitting: *Deleted

## 2013-07-25 MED ORDER — METHYLPHENIDATE HCL 10 MG PO TABS: 10.0000 mg | ORAL_TABLET | Freq: Three times a day (TID) | ORAL | Status: DC

## 2013-07-25 NOTE — Telephone Encounter (Signed)
Pt came in to pick rx of Ritalin today and stated that she suppose to get 90 days supply. Pt request other 2 rx for the next two months to be done by Friday Morning. Pt is working at night, she might not be able to pick up her phone when we call. Please advise.

## 2013-07-25 NOTE — Telephone Encounter (Signed)
Please remind pt to mention need for 3 mo supply next time, as we dont have a way of tracking this  Done hardcopy to robin

## 2013-07-25 NOTE — Telephone Encounter (Signed)
Tried calling pt couldn't leave msg due to VM being full. Sent pt email stating prescriptions ready for pick-up...Kellie Curtis

## 2013-10-17 ENCOUNTER — Encounter: Payer: Self-pay | Admitting: Gastroenterology

## 2014-01-15 ENCOUNTER — Telehealth: Payer: Self-pay | Admitting: *Deleted

## 2014-01-15 MED ORDER — METHYLPHENIDATE HCL 10 MG PO TABS: 10.0000 mg | ORAL_TABLET | Freq: Three times a day (TID) | ORAL | Status: DC

## 2014-01-15 NOTE — Telephone Encounter (Signed)
Notified pt rx's ready for pick-up../lmb 

## 2014-01-15 NOTE — Telephone Encounter (Signed)
Left msg on triage requesting refill on her ritalin...[name]

## 2014-01-15 NOTE — Telephone Encounter (Signed)
Done hardcopy to robin - total 3 mo 

## 2014-01-17 ENCOUNTER — Other Ambulatory Visit: Payer: Self-pay

## 2014-04-11 ENCOUNTER — Ambulatory Visit
Admission: RE | Admit: 2014-04-11 | Discharge: 2014-04-11 | Disposition: A | Discharge status: Home / Self Care | Payer: Self-pay | Source: Ambulatory Visit | Attending: Family Medicine | Admitting: Family Medicine

## 2014-04-11 ENCOUNTER — Ambulatory Visit (INDEPENDENT_AMBULATORY_CARE_PROVIDER_SITE_OTHER): Payer: BLUE CROSS/BLUE SHIELD | Admitting: Family Medicine

## 2014-04-11 ENCOUNTER — Ambulatory Visit (INDEPENDENT_AMBULATORY_CARE_PROVIDER_SITE_OTHER): Payer: BLUE CROSS/BLUE SHIELD

## 2014-04-11 VITALS — BP 110/82 | HR 79 | Temp 98.3°F | Resp 18 | Ht 65.0 in | Wt 192.0 lb

## 2014-04-11 DIAGNOSIS — R0782 Intercostal pain: Secondary | ICD-10-CM

## 2014-04-11 DIAGNOSIS — R05 Cough: Secondary | ICD-10-CM

## 2014-04-11 DIAGNOSIS — R0602 Shortness of breath: Secondary | ICD-10-CM

## 2014-04-11 DIAGNOSIS — R059 Cough, unspecified: Secondary | ICD-10-CM

## 2014-04-11 MED ORDER — PREDNISONE 20 MG PO TABS: ORAL_TABLET | ORAL | Status: DC

## 2014-04-11 MED ORDER — ALBUTEROL SULFATE HFA 108 (90 BASE) MCG/ACT IN AERS: 2.0000 | INHALATION_SPRAY | Freq: Four times a day (QID) | RESPIRATORY_TRACT | Status: DC | PRN

## 2014-04-11 MED ORDER — HYDROCODONE-HOMATROPINE 5-1.5 MG/5ML PO SYRP: 5.0000 mL | ORAL_SOLUTION | Freq: Three times a day (TID) | ORAL | Status: DC | PRN

## 2014-04-11 MED ORDER — ALBUTEROL SULFATE (2.5 MG/3ML) 0.083% IN NEBU
2.5000 mg | INHALATION_SOLUTION | Freq: Once | RESPIRATORY_TRACT | Status: AC
  Administered 2014-04-11: 2.5 mg via RESPIRATORY_TRACT

## 2014-04-11 NOTE — Patient Instructions (Signed)
Use the prednisone as directed and the inhaler as needed for likely reactive airway disease exacerbation If you are not continuing to feel better please seek care right away!  Use the cough syrup as needed but remember it will make you feel sleepy

## 2014-04-11 NOTE — Progress Notes (Signed)
[facility] [address] [place] [address] [phone]  Date:  04/11/2014   Name:  Kellie Curtis   DOB:  1970/04/18   MRN:  [id number]  PCP:  Cathlean Cower, MD    Chief Complaint: Chest Pain; Wheezing; and Cough   History of Present Illness:  Kellie Curtis is a 44 y.o. very pleasant female patient who presents with the following:  Here today with illness.  Since yesterday she has felt tight winded, wheezing, and her chest hurt.  She has noted a mild cough and runny nose but no fever.  She did not have an inhaler available at home - she does have a history of asthma She has no history of CAD.  She has never been a smoker.   Her father had a lot of health problems- she thinks something was wrong with his heart but she is not quite sure.  OW no family history of CAD  She has a history of IBS and GERD.  She had to stop taking her IBS medication due to cost.  She does take ritalin still and her OCP.     Patient Active Problem List   Diagnosis Date Noted  . Hemoptysis 05/10/2013  . Acute bronchitis 01/11/2013  . Chest pain 01/11/2013  . Anemia, unspecified 10/04/2012  . Allergic rhinitis, cause unspecified 03/10/2012  . Irregular menses 05/30/2011  . Irritable bowel syndrome with constipation 05/30/2011  . Preventative health care 05/28/2011  . GERD 05/25/2010  . HIATAL HERNIA WITH REFLUX 05/21/2010  . COLONIC POLYPS, HX OF 05/21/2010  . CONSTIPATION 04/27/2010  . ANXIETY 03/14/2008  . DEPRESSION 03/14/2008  . ADHD 03/14/2008  . SEIZURE DISORDER 02/13/2008  . HYPERLIPIDEMIA 02/07/2008  . BACK PAIN 02/07/2008    Past Medical History  Diagnosis Date  . Learning disorder   . Seizure disorder   . Personal history of colonic polyps 05/12/2010    TUBULAR ADENOMA  . Abdominal pain, left upper quadrant   . Hiatal hernia   . Abdominal pain, left lower quadrant   . Unspecified constipation   . Abdominal pain, left upper quadrant   . Abdominal pain,  epigastric   . Wheezing   . Chest pain, unspecified   . Fever, unspecified   . Attention deficit disorder with hyperactivity(314.01)   . Elevated blood pressure reading without diagnosis of hypertension   . Depressive disorder, not elsewhere classified   . Anxiety state, unspecified   . Other convulsions   . Backache, unspecified   . Chest pain, unspecified   . Acute sinusitis, unspecified   . Other and unspecified hyperlipidemia   . IBS (irritable bowel syndrome)   . Depression 10/05/2012  . Anemia     Past Surgical History  Procedure Laterality Date  . Cesarean section      X 2  . Tonsillectomy    . Tubal ligation      History  Substance Use Topics  . Smoking status: Never Smoker   . Smokeless tobacco: Never Used  . Alcohol Use: 1.2 oz/week    2 Cans of beer per week     Comment: per week    Family History  Problem Relation Age of Onset  . Stroke Father   . Hypertension Father   . Gout Father   . Hyperlipidemia Father   . Alcohol abuse      grandfather  . Breast cancer      grandmother  . Hypertension  grandmother  . Asthma      grandmother  . Asthma Mother   . Colon cancer Neg Hx     No Known Allergies  Medication list has been reviewed and updated.  Current Outpatient Prescriptions on File Prior to Visit  Medication Sig Dispense Refill  . methylphenidate (RITALIN) 10 MG tablet Take 1 tablet (10 mg total) by mouth 3 (three) times daily. To fill Mar 16, 2014 90 tablet 0  . HYDROcodone-homatropine (HYCODAN) 5-1.5 MG/5ML syrup Take 5 mLs by mouth every 6 (six) hours as needed for cough. (Patient not taking: Reported on 04/11/2014) 180 mL 0  . levonorgestrel-ethinyl estradiol (AVIANE,ALESSE,LESSINA) 0.1-20 MG-MCG tablet Take 1 tablet by mouth daily.    . Linaclotide (LINZESS) 145 MCG CAPS Take 1 capsule (145 mcg total) by mouth daily. (Patient not taking: Reported on 04/11/2014) 30 capsule 11  . omeprazole (PRILOSEC) 20 MG capsule Take 2 capsules (40 mg  total) by mouth daily. (Patient not taking: Reported on 04/11/2014) 180 capsule 3   No current facility-administered medications on file prior to visit.    Review of Systems:  As per HPI- otherwise negative.   Physical Examination: Filed Vitals:   04/11/14 1537  BP: 110/82  Pulse: 79  Temp: 98.3 F (36.8 C)  Resp: 18   Filed Vitals:   04/11/14 1537  Height: 5\' 5"  (1.651 m)  Weight: 192 lb (87.091 kg)   Body mass index is 31.95 kg/(m^2). Ideal Body Weight: Weight in (lb) to have BMI = 25: 149.9  GEN: WDWN, NAD, Non-toxic, A & O x 3, overweight, looks well HEENT: Atraumatic, Normocephalic. Neck supple. No masses, No LAD.  Bilateral TM wnl, oropharynx normal.  PEERL,EOMI.   Ears and Nose: No external deformity. CV: RRR, No M/G/R. No JVD. No thrill. No extra heart sounds. Easily reproduced CP by pressing on her anterior chest wall PULM: CTA B, no wheezes, crackles, rhonchi. No retractions. No resp. distress. No accessory muscle use. ABD: S, NT, ND EXTR: No c/c/e NEURO Normal gait.  PSYCH: Normally interactive. Conversant. Not depressed or anxious appearing.  Calm demeanor.   [facility] reading (PRIMARY) by  Dr. Lorelei Pont. CXR:  Negative  CHEST 2 VIEW  COMPARISON: None. FINDINGS: The heart size and mediastinal contours are within normal limits. Both lungs are clear. The visualized skeletal structures are unremarkable. IMPRESSION: No active cardiopulmonary disease.   Albuterol neb: helped her feel better  EKG: NSR, no concerning ST changes  Assessment and Plan: Shortness of breath - Plan: albuterol (PROVENTIL) (2.5 MG/3ML) 0.083% nebulizer solution 2.5 mg, DG Chest 2 View, albuterol (PROVENTIL HFA;VENTOLIN HFA) 108 (90 BASE) MCG/ACT inhaler, DG Chest 2 View, predniSONE (DELTASONE) 20 MG tablet  Intercostal pain - Plan: EKG 12-Lead  Cough - Plan: HYDROcodone-homatropine (HYCODAN) 5-1.5 MG/5ML syrup  [place] is seen today with SOB, chest pain and wheezing.  By the end  of her visit her CP and SOB were resolved.  Discussed causes of these sx from the benign to the serious, and offered to draw a D dimer and/ or troponin as part of further evaluation.  However her response to albuterol and reproducible chest pain is most consistent with MSK chest pain and reactive airway exacerbation.  She would like to treat conservatively as above and declines further testing or labs for now. Will use a short course of prednisone for inflammation She will seek care right away if getting worse  See patient instructions for more details.     Signed Lamar Blinks, MD

## 2014-05-07 ENCOUNTER — Telehealth: Payer: Self-pay | Admitting: Internal Medicine

## 2014-05-07 NOTE — Telephone Encounter (Signed)
Pt has only been on:  adderall 20 mg - 1 in am, and 2 in PM  Then more recently:  Ritalin 10 mg three times per day

## 2014-05-07 NOTE — Telephone Encounter (Signed)
Pt called request a list of medication that Dr. Jenny Reichmann tired to get her to take for ADHD. Please call pt

## 2014-05-08 NOTE — Telephone Encounter (Signed)
Called left a detailed message of request per pt.

## 2014-05-09 ENCOUNTER — Telehealth: Payer: Self-pay | Admitting: Internal Medicine

## 2014-05-09 NOTE — Telephone Encounter (Signed)
Patient is requesting a letter stating what medications she has been on for ADHD and she needs it noted if he has changed the dosage.  This was her original request but it did not get translated correctly. She would like to know if she can get this letter before close this afternoon.

## 2014-05-12 NOTE — Telephone Encounter (Signed)
Can you do this

## 2014-05-12 NOTE — Telephone Encounter (Signed)
Rachel Bo, can you please call patient today if not.

## 2014-05-13 ENCOUNTER — Encounter: Payer: Self-pay | Admitting: Internal Medicine

## 2014-05-13 NOTE — Telephone Encounter (Signed)
Letter done

## 2014-05-14 NOTE — Telephone Encounter (Signed)
Called patient to advise letter is ready for pickup. Placed in patient waiting outbox up front.

## 2014-05-22 ENCOUNTER — Ambulatory Visit (INDEPENDENT_AMBULATORY_CARE_PROVIDER_SITE_OTHER): Payer: BLUE CROSS/BLUE SHIELD | Admitting: Family Medicine

## 2014-05-22 VITALS — BP 108/64 | HR 77 | Temp 97.8°F | Resp 17 | Ht 64.0 in | Wt 191.6 lb

## 2014-05-22 DIAGNOSIS — N946 Dysmenorrhea, unspecified: Secondary | ICD-10-CM

## 2014-05-22 DIAGNOSIS — R109 Unspecified abdominal pain: Secondary | ICD-10-CM

## 2014-05-22 DIAGNOSIS — K5900 Constipation, unspecified: Secondary | ICD-10-CM

## 2014-05-22 DIAGNOSIS — N921 Excessive and frequent menstruation with irregular cycle: Secondary | ICD-10-CM

## 2014-05-22 DIAGNOSIS — N924 Excessive bleeding in the premenopausal period: Secondary | ICD-10-CM

## 2014-05-22 DIAGNOSIS — R87612 Low grade squamous intraepithelial lesion on cytologic smear of cervix (LGSIL): Secondary | ICD-10-CM

## 2014-05-22 LAB — COMPLETE METABOLIC PANEL WITH GFR
ALT: 11 U/L (ref 0–35)
AST: 16 U/L (ref 0–37)
Albumin: 4.3 g/dL (ref 3.5–5.2)
Alkaline Phosphatase: 33 U/L — ABNORMAL LOW (ref 39–117)
BUN: 10 mg/dL (ref 6–23)
CO2: 30 mEq/L (ref 19–32)
Calcium: 9.5 mg/dL (ref 8.4–10.5)
Chloride: 101 mEq/L (ref 96–112)
Creat: 0.78 mg/dL (ref 0.50–1.10)
GFR, Est African American: >89 mL/min
GFR, Est Non African American: >89 mL/min
GLUCOSE: 89 mg/dL (ref 70–99)
Potassium: 4.1 mEq/L (ref 3.5–5.3)
SODIUM: 135 meq/L (ref 135–145)
TOTAL PROTEIN: 7.1 g/dL (ref 6.0–8.3)
Total Bilirubin: 0.4 mg/dL (ref 0.2–1.2)

## 2014-05-22 LAB — POCT CBC
Granulocyte percent: 50.3 %G (ref 37–80)
HEMATOCRIT: 37.3 % — AB (ref 37.7–47.9)
Hemoglobin: 11.6 g/dL — AB (ref 12.2–16.2)
Lymph, poc: 1.9 (ref 0.6–3.4)
MCH, POC: 29.2 pg (ref 27–31.2)
MCHC: 31.2 g/dL — AB (ref 31.8–35.4)
MCV: 93.7 fL (ref 80–97)
MID (cbc): 0.4 (ref 0–0.9)
MPV: 7.2 fL (ref 0–99.8)
POC Granulocyte: 2.3 (ref 2–6.9)
POC LYMPH %: 41.6 % (ref 10–50)
POC MID %: 8.1 %M (ref 0–12)
Platelet Count, POC: 358 10*3/uL (ref 142–424)
RBC: 3.98 M/uL — AB (ref 4.04–5.48)
RDW, POC: 16.1 %
WBC: 4.5 10*3/uL — AB (ref 4.6–10.2)

## 2014-05-22 LAB — POCT URINALYSIS DIPSTICK
Glucose, UA: NEGATIVE
Leukocytes, UA: NEGATIVE
Nitrite, UA: NEGATIVE
PROTEIN UA: NEGATIVE
Spec Grav, UA: >=1.03
Urobilinogen, UA: 0.2
pH, UA: 5

## 2014-05-22 LAB — POCT WET PREP WITH KOH
BACTERIA WET PREP HPF POC: NEGATIVE
CLUE CELLS WET PREP PER HPF POC: NEGATIVE
KOH PREP POC: NEGATIVE
RBC Wet Prep HPF POC: NEGATIVE
TRICHOMONAS UA: NEGATIVE
WBC Wet Prep HPF POC: NEGATIVE
Yeast Wet Prep HPF POC: NEGATIVE

## 2014-05-22 LAB — IFOBT (OCCULT BLOOD): IFOBT: NEGATIVE

## 2014-05-22 LAB — POCT UA - MICROSCOPIC ONLY
Bacteria, U Microscopic: NEGATIVE
CASTS, UR, LPF, POC: NEGATIVE
Mucus, UA: NEGATIVE
RBC, URINE, MICROSCOPIC: NEGATIVE
YEAST UA: NEGATIVE

## 2014-05-22 LAB — TSH: TSH: 1.03 u[IU]/mL (ref 0.350–4.500)

## 2014-05-22 LAB — FERRITIN: FERRITIN: 5 ng/mL — AB (ref 10–291)

## 2014-05-22 MED ORDER — LEVONORGESTREL-ETHINYL ESTRAD 0.1-20 MG-MCG PO TABS: 1.0000 | ORAL_TABLET | Freq: Every day | ORAL | Status: DC

## 2014-05-22 NOTE — Progress Notes (Addendum)
This chart was scribed for Delman Cheadle, MD by Lowella Petties, ED Scribe. The patient was seen in room 3. Patient's care was started at 10:53 AM.   Subjective:   Patient ID: Kellie Curtis, female    DOB: 1971-01-13, 44 y.o.   MRN: [id number] Chief Complaint  Patient presents with  . Back Pain    x yesterday  . Menstrual Problem    started light since first of the month until today heavy, clotting.    HPI  HPI Comments: Kellie Curtis is a 44 y.o. female who presents to the [facility] complaining of metromenorrhagia. Not using any otc medicines. She had a tubal ligation about 10 years ago after her last child.   Father had colon cancer at 43 yo.  Pt reports she has a cyst or an ulcer stomach that her father had as well - she was told this 16 yrs ago and doesn't remember anything else about it.  Has occasional abdominal pain which occasionally hurts so severe she just stays in bed and takes painkillers - attributes this due to stress but also has had several colonoscopies prior - reports she was last had one abouty 3 yrs ago and hade several polpyps - this was by Dr. Sharlett Iles in 2012 - pt states she was supposed to have follow-up but never did.  She states she has had abnormal pap smears but has never had a colposcopy.  Has had irregular periods for a very long time - over 10 years - and was put on OCPs to regulate these initially which worked well but had to be taken off of them aftyer about 6 mos as she couldn't follow up with the physician for financial reasons.  No h/o tobacco use.  No otc meds, no vitamins or supplements.  Is not fasting today.  Going to est w/ Dr. Lorelei Pont as PCP - has appt sched for April.  Is having some urinary incontinence, c/o abdominal bloating intermittently accompanying her menstrual cycle for the past 10 yrs.  Is sexually active - has been in an monogamous relationship for the past 17 yrs.  Has sometimes problems getting food due to  poverty.  Has hsitory of gerd and IBS.  Past Medical History  Diagnosis Date  . Learning disorder   . Seizure disorder   . Personal history of colonic polyps 05/12/2010    TUBULAR ADENOMA  . Abdominal pain, left upper quadrant   . Hiatal hernia   . Abdominal pain, left lower quadrant   . Unspecified constipation   . Abdominal pain, left upper quadrant   . Abdominal pain, epigastric   . Wheezing   . Chest pain, unspecified   . Fever, unspecified   . Attention deficit disorder with hyperactivity(314.01)   . Elevated blood pressure reading without diagnosis of hypertension   . Depressive disorder, not elsewhere classified   . Anxiety state, unspecified   . Other convulsions   . Backache, unspecified   . Chest pain, unspecified   . Acute sinusitis, unspecified   . Other and unspecified hyperlipidemia   . IBS (irritable bowel syndrome)   . Depression 10/05/2012  . Anemia    Current Outpatient Prescriptions on File Prior to Visit  Medication Sig Dispense Refill  . albuterol (PROVENTIL HFA;VENTOLIN HFA) 108 (90 BASE) MCG/ACT inhaler Inhale 2 puffs into the lungs every 6 (six) hours as needed for wheezing or shortness of breath. 1 Inhaler 2  . levonorgestrel-ethinyl estradiol (AVIANE,ALESSE,LESSINA) 0.1-20 MG-MCG tablet  Take 1 tablet by mouth daily.    . methylphenidate (RITALIN) 10 MG tablet Take 1 tablet (10 mg total) by mouth 3 (three) times daily. To fill Mar 16, 2014 (Patient not taking: Reported on 05/22/2014) 90 tablet 0  . omeprazole (PRILOSEC) 20 MG capsule Take 2 capsules (40 mg total) by mouth daily. (Patient not taking: Reported on 04/11/2014) 180 capsule 3  . predniSONE (DELTASONE) 20 MG tablet Take 2 pills daily for 3 days (Patient not taking: Reported on 05/22/2014) 6 tablet 0   No current facility-administered medications on file prior to visit.   Allergies  Allergen Reactions  . Hydrocodone-Homatropine Itching    Review of Systems  Constitutional: Positive for  activity change. Negative for fever, chills, diaphoresis, appetite change, fatigue and unexpected weight change.  Gastrointestinal: Positive for abdominal pain and abdominal distention. Negative for diarrhea, constipation, blood in stool, anal bleeding and rectal pain.  Genitourinary: Positive for urgency, frequency, vaginal bleeding, vaginal discharge, enuresis, menstrual problem, pelvic pain and dyspareunia. Negative for dysuria, hematuria, decreased urine volume, difficulty urinating, genital sores and vaginal pain.  Musculoskeletal: Positive for back pain and arthralgias. Negative for joint swelling and gait problem.  Skin: Negative for rash.  Hematological: Negative for adenopathy.  Psychiatric/Behavioral: Positive for sleep disturbance. The patient is not nervous/anxious.     BP 108/64 mmHg  Pulse 77  Temp(Src) 97.8 F (36.6 C) (Oral)  Resp 17  Ht 5\' 4"  (1.626 m)  Wt 191 lb 9.6 oz (86.909 kg)  BMI 32.87 kg/m2  SpO2 98%  Objective:  Physical Exam  Constitutional: She is oriented to person, place, and time. She appears well-developed and well-nourished. No distress.  HENT:  Head: Normocephalic and atraumatic.  Eyes: Conjunctivae and EOM are normal.  Neck: Neck supple. No tracheal deviation present.  Cardiovascular: Normal rate.   Pulmonary/Chest: Effort normal. No respiratory distress.  Musculoskeletal: Normal range of motion.  Neurological: She is alert and oriented to person, place, and time.  Skin: Skin is warm and dry.  Psychiatric: She has a normal mood and affect. Her behavior is normal.  Nursing note and vitals reviewed.   Assessment & Plan:  LSGIL on pap with h/o abnml paps but no prior colposcopy - refer to gyn as colposcopy now indicated. Restart OCPs to help regulate menses  Start iron supp due to mild anemia w/ ferritin at 5.  Reviewed GERD diet.    Metrorrhagia - Plan: POCT CBC, IFOBT POC (occult bld, rslt in office), POCT urinalysis dipstick, POCT UA -  Microscopic Only, POCT Wet Prep with KOH, COMPLETE METABOLIC PANEL WITH GFR, TSH, Hepatitis C antibody, RPR, Ferritin, Pap IG, CT/NG NAA, and HPV (high risk), Urine culture, HIV antibody (with reflex), US Transvaginal Non-OB, CANCELED: HIV antibody, CANCELED: US Pelvis Complete  Excessive bleeding in premenopausal period - Plan: POCT CBC, IFOBT POC (occult bld, rslt in office), POCT urinalysis dipstick, POCT UA - Microscopic Only, POCT Wet Prep with KOH, COMPLETE METABOLIC PANEL WITH GFR, TSH, Hepatitis C antibody, RPR, Ferritin, Pap IG, CT/NG NAA, and HPV (high risk), Urine culture, HIV antibody (with reflex), US Transvaginal Non-OB, CANCELED: HIV antibody, CANCELED: US Pelvis Complete  Menorrhagia with irregular cycle - Plan: POCT CBC, IFOBT POC (occult bld, rslt in office), POCT urinalysis dipstick, POCT UA - Microscopic Only, POCT Wet Prep with KOH, COMPLETE METABOLIC PANEL WITH GFR, TSH, Hepatitis C antibody, RPR, Ferritin, Pap IG, CT/NG NAA, and HPV (high risk), Urine culture, HIV antibody (with reflex), US Transvaginal Non-OB, CANCELED: HIV antibody,  CANCELED: US Pelvis Complete  Dysmenorrhea - Plan: POCT CBC, IFOBT POC (occult bld, rslt in office), POCT urinalysis dipstick, POCT UA - Microscopic Only, POCT Wet Prep with KOH, COMPLETE METABOLIC PANEL WITH GFR, TSH, Hepatitis C antibody, RPR, Ferritin, Pap IG, CT/NG NAA, and HPV (high risk), Urine culture, HIV antibody (with reflex), US Transvaginal Non-OB, CANCELED: HIV antibody, CANCELED: US Pelvis Complete  Abdominal pain, unspecified abdominal location - Plan: POCT CBC, IFOBT POC (occult bld, rslt in office), POCT urinalysis dipstick, POCT UA - Microscopic Only, POCT Wet Prep with KOH, COMPLETE METABOLIC PANEL WITH GFR, TSH, Hepatitis C antibody, RPR, Ferritin, Pap IG, CT/NG NAA, and HPV (high risk), Urine culture, HIV antibody (with reflex), CANCELED: HIV antibody  Constipation, unspecified constipation type - Plan: POCT CBC, IFOBT POC (occult  bld, rslt in office), POCT urinalysis dipstick, POCT UA - Microscopic Only, POCT Wet Prep with KOH, COMPLETE METABOLIC PANEL WITH GFR, TSH, Hepatitis C antibody, RPR, Ferritin, Pap IG, CT/NG NAA, and HPV (high risk), Urine culture, HIV antibody (with reflex), CANCELED: HIV antibody  Meds ordered this encounter  Medications  . methylphenidate (DAYTRANA) 20 MG/9HR    Sig: Place 1 patch onto the skin daily. wear patch for 9 hours only each day  . levonorgestrel-ethinyl estradiol (AVIANE,ALESSE,LESSINA) 0.1-20 MG-MCG tablet    Sig: Take 1 tablet by mouth daily.    Dispense:  1 Package    Refill:  5    I personally performed the services described in this documentation, which was scribed in my presence. The recorded information has been reviewed and considered, and addended by me as needed.  Delman Cheadle, MD MPH  Results for orders placed or performed in visit on 05/22/14  Urine culture  Result Value Ref Range   Colony Count 7,000 COLONIES/ML    Organism ID, Bacteria Insignificant Growth   COMPLETE METABOLIC PANEL WITH GFR  Result Value Ref Range   Sodium 135 135 - 145 mEq/L   Potassium 4.1 3.5 - 5.3 mEq/L   Chloride 101 96 - 112 mEq/L   CO2 30 19 - 32 mEq/L   Glucose, Bld 89 70 - 99 mg/dL   BUN 10 6 - 23 mg/dL   Creat 0.78 0.50 - 1.10 mg/dL   Total Bilirubin 0.4 0.2 - 1.2 mg/dL   Alkaline Phosphatase 33 (L) 39 - 117 U/L   AST 16 0 - 37 U/L   ALT 11 0 - 35 U/L   Total Protein 7.1 6.0 - 8.3 g/dL   Albumin 4.3 3.5 - 5.2 g/dL   Calcium 9.5 8.4 - 10.5 mg/dL   GFR, Est African American >89 mL/min   GFR, Est Non African American >89 mL/min  TSH  Result Value Ref Range   TSH 1.030 0.350 - 4.500 uIU/mL  Hepatitis C antibody  Result Value Ref Range   HCV Ab NEGATIVE NEGATIVE  RPR  Result Value Ref Range   RPR Ser Ql NON REAC NON REAC  Ferritin  Result Value Ref Range   Ferritin 5 (L) 10 - 291 ng/mL  HIV antibody (with reflex)  Result Value Ref Range   HIV 1&2 Ab, 4th Generation  NONREACTIVE NONREACTIVE  POCT CBC  Result Value Ref Range   WBC 4.5 (A) 4.6 - 10.2 K/uL   Lymph, poc 1.9 0.6 - 3.4   POC LYMPH PERCENT 41.6 10 - 50 %L   MID (cbc) 0.4 0 - 0.9   POC MID % 8.1 0 - 12 %M   POC Granulocyte 2.3  2 - 6.9   Granulocyte percent 50.3 37 - 80 %G   RBC 3.98 (A) 4.04 - 5.48 M/uL   Hemoglobin 11.6 (A) 12.2 - 16.2 g/dL   HCT, POC 37.3 (A) 37.7 - 47.9 %   MCV 93.7 80 - 97 fL   MCH, POC 29.2 27 - 31.2 pg   MCHC 31.2 (A) 31.8 - 35.4 g/dL   RDW, POC 16.1 %   Platelet Count, POC 358 142 - 424 K/uL   MPV 7.2 0 - 99.8 fL  IFOBT POC (occult bld, rslt in office)  Result Value Ref Range   IFOBT Negative   POCT urinalysis dipstick  Result Value Ref Range   Color, UA yellow    Clarity, UA clear    Glucose, UA neg    Bilirubin, UA small    Ketones, UA trace    Spec Grav, UA >=1.030    Blood, UA small    pH, UA 5.0    Protein, UA neg    Urobilinogen, UA 0.2    Nitrite, UA neg    Leukocytes, UA Negative   POCT UA - Microscopic Only  Result Value Ref Range   WBC, Ur, HPF, POC 0-3    RBC, urine, microscopic neg    Bacteria, U Microscopic neg    Mucus, UA neg    Epithelial cells, urine per micros 0-1    Crystals, Ur, HPF, POC calcium oxalate    Casts, Ur, LPF, POC neg    Yeast, UA neg   POCT Wet Prep with KOH  Result Value Ref Range   Trichomonas, UA Negative    Clue Cells Wet Prep HPF POC neg    Epithelial Wet Prep HPF POC 0-14    Yeast Wet Prep HPF POC neg    Bacteria Wet Prep HPF POC neg    RBC Wet Prep HPF POC neg    WBC Wet Prep HPF POC neg    KOH Prep POC Negative   Pap IG, CT/NG NAA, and HPV (high risk)  Result Value Ref Range   HPV DNA High Risk     Specimen adequacy: SEE NOTE    FINAL DIAGNOSIS: SEE NOTE (A)    COMMENTS: SEE NOTE    RECOMMENDATIONS: SEE NOTE    Cytotechnologist: SEE NOTE    Pathologist: SEE NOTE    Chlamydia Probe Amp NEGATIVE    GC Probe Amp NEGATIVE

## 2014-05-22 NOTE — Patient Instructions (Addendum)
Food Choices for Gastroesophageal Reflux Disease When you have gastroesophageal reflux disease (GERD), the foods you eat and your eating habits are very important. Choosing the right foods can help ease the discomfort of GERD. WHAT GENERAL GUIDELINES DO I NEED TO FOLLOW?  Choose fruits, vegetables, whole grains, low-fat dairy products, and low-fat meat, fish, and poultry.  Limit fats such as oils, salad dressings, butter, nuts, and avocado.  Keep a food diary to identify foods that cause symptoms.  Avoid foods that cause reflux. These may be different for different people.  Eat frequent small meals instead of three large meals each day.  Eat your meals slowly, in a relaxed setting.  Limit fried foods.  Cook foods using methods other than frying.  Avoid drinking alcohol.  Avoid drinking large amounts of liquids with your meals.  Avoid bending over or lying down until 2-3 hours after eating. WHAT FOODS ARE NOT RECOMMENDED? The following are some foods and drinks that may worsen your symptoms: Vegetables Tomatoes. Tomato juice. Tomato and spaghetti sauce. Chili peppers. Onion and garlic. Horseradish. Fruits Oranges, grapefruit, and lemon (fruit and juice). Meats High-fat meats, fish, and poultry. This includes hot dogs, ribs, ham, sausage, salami, and bacon. Dairy Whole milk and chocolate milk. Sour cream. Cream. Butter. Ice cream. Cream cheese.  Beverages Coffee and tea, with or without caffeine. Carbonated beverages or energy drinks. Condiments Hot sauce. Barbecue sauce.  Sweets/Desserts Chocolate and cocoa. Donuts. Peppermint and spearmint. Fats and Oils High-fat foods, including [place] fries and potato chips. Other Vinegar. Strong spices, such as black pepper, white pepper, red pepper, cayenne, curry powder, cloves, ginger, and chili powder. The items listed above may not be a complete list of foods and beverages to avoid. Contact your dietitian for more  information. Document Released: 03/21/2005 Document Revised: 03/26/2013 Document Reviewed: 01/23/2013 [facility] Patient Information 2015 [place], [place]. This information is not intended to replace advice given to you by your health care provider. Make sure you discuss any questions you have with your health care provider. Constipation Constipation is when a person has fewer than three bowel movements a week, has difficulty having a bowel movement, or has stools that are dry, hard, or larger than normal. As people grow older, constipation is more common. If you try to fix constipation with medicines that make you have a bowel movement (laxatives), the problem may get worse. Long-term laxative use may cause the muscles of the colon to become weak. A low-fiber diet, not taking in enough fluids, and taking certain medicines may make constipation worse.  CAUSES   Certain medicines, such as antidepressants, pain medicine, iron supplements, antacids, and water pills.   Certain diseases, such as diabetes, irritable bowel syndrome (IBS), thyroid disease, or depression.   Not drinking enough water.   Not eating enough fiber-rich foods.   Stress or travel.   Lack of physical activity or exercise.   Ignoring the urge to have a bowel movement.   Using laxatives too much.  SIGNS AND SYMPTOMS   Having fewer than three bowel movements a week.   Straining to have a bowel movement.   Having stools that are hard, dry, or larger than normal.   Feeling full or bloated.   Pain in the lower abdomen.   Not feeling relief after having a bowel movement.  DIAGNOSIS  Your health care provider will take a medical history and perform a physical exam. Further testing may be done for severe constipation. Some tests may include:  A barium  enema X-ray to examine your rectum, colon, and, sometimes, your small intestine.   A sigmoidoscopy to examine your lower colon.   A colonoscopy to examine  your entire colon. TREATMENT  Treatment will depend on the severity of your constipation and what is causing it. Some dietary treatments include drinking more fluids and eating more fiber-rich foods. Lifestyle treatments may include regular exercise. If these diet and lifestyle recommendations do not help, your health care provider may recommend taking over-the-counter laxative medicines to help you have bowel movements. Prescription medicines may be prescribed if over-the-counter medicines do not work.  HOME CARE INSTRUCTIONS   Eat foods that have a lot of fiber, such as fruits, vegetables, whole grains, and beans.  Limit foods high in fat and processed sugars, such as french fries, hamburgers, cookies, candies, and soda.   A fiber supplement may be added to your diet if you cannot get enough fiber from foods.   Drink enough fluids to keep your urine clear or pale yellow.   Exercise regularly or as directed by your health care provider.   Go to the restroom when you have the urge to go. Do not hold it.   Only take over-the-counter or prescription medicines as directed by your health care provider. Do not take other medicines for constipation without talking to your health care provider first.  [place] IF:   You have bright red blood in your stool.   Your constipation lasts for more than 4 days or gets worse.   You have abdominal or rectal pain.   You have thin, pencil-like stools.   You have unexplained weight loss. MAKE SURE YOU:   Understand these instructions.  Will watch your condition.  Will get help right away if you are not doing well or get worse. Document Released: 12/18/2003 Document Revised: 03/26/2013 Document Reviewed: 12/31/2012 [facility] Patient Information 2015 [place], [place]. This information is not intended to replace advice given to you by your health care provider. Make sure you discuss any questions you have with your health  care provider.  Iron-Rich Diet An iron-rich diet contains foods that are good sources of iron. Iron is an important mineral that helps your body produce hemoglobin. Hemoglobin is a protein in red blood cells that carries oxygen to the body's tissues. Sometimes, the iron level in your blood can be low. This may be caused by:  A lack of iron in your diet.  Blood loss.  Times of growth, such as during pregnancy or during a child's growth and development. Low levels of iron can cause a decrease in the number of red blood cells. This can result in iron deficiency anemia. Iron deficiency anemia symptoms include:  Tiredness.  Weakness.  Irritability.  Increased chance of infection. Here are some recommendations for daily iron intake:  Males older than 46 years of age need 8 mg of iron per day.  Women ages 63 to 28 need 18 mg of iron per day.  Pregnant women need 27 mg of iron per day, and women who are over 58 years of age and breastfeeding need 9 mg of iron per day.  Women over the age of 66 need 8 mg of iron per day. SOURCES OF IRON There are 2 types of iron that are found in food: heme iron and nonheme iron. Heme iron is absorbed by the body better than nonheme iron. Heme iron is found in meat, poultry, and fish. Nonheme iron is found in grains, beans, and  vegetables. Heme Iron Sources Food / Iron (mg)  Chicken liver, 3 oz (85 g)/ 10 mg  Beef liver, 3 oz (85 g)/ 5.5 mg  Oysters, 3 oz (85 g)/ 8 mg  Beef, 3 oz (85 g)/ 2 to 3 mg  Shrimp, 3 oz (85 g)/ 2.8 mg  [place], 3 oz (85 g)/ 2 mg  Chicken, 3 oz (85 g) / 1 mg  Fish (tuna, halibut), 3 oz (85 g)/ 1 mg  Pork, 3 oz (85 g)/ 0.9 mg Nonheme Iron Sources Food / Iron (mg)  Ready-to-eat breakfast cereal, iron-fortified / 3.9 to 7 mg  Tofu,  cup / 3.4 mg  Kidney beans,  cup / 2.6 mg  Baked potato with skin / 2.7 mg  Asparagus,  cup / 2.2 mg  Avocado / 2 mg  Dried peaches,  cup / 1.6 mg  Raisins,  cup / 1.5  mg  Soy milk, 1 cup / 1.5 mg  Whole-wheat bread, 1 slice / 1.2 mg  Spinach, 1 cup / 0.8 mg  Broccoli,  cup / 0.6 mg IRON ABSORPTION Certain foods can decrease the body's absorption of iron. Try to avoid these foods and beverages while eating meals with iron-containing foods:  Coffee.  Tea.  Fiber.  Soy. Foods containing vitamin C can help increase the amount of iron your body absorbs from iron sources, especially from nonheme sources. Eat foods with vitamin C along with iron-containing foods to increase your iron absorption. Foods that are high in vitamin C include many fruits and vegetables. Some good sources are:  Fresh orange juice.  Oranges.  Strawberries.  Mangoes.  Grapefruit.  Red bell peppers.  Green bell peppers.  Broccoli.  Potatoes with skin.  Tomato juice. Document Released: 11/02/2004 Document Revised: 06/13/2011 Document Reviewed: 09/09/2010 [facility] [place] [facility] Patient Information 2015 [place], [place]. This information is not intended to replace advice given to you by your health care provider. Make sure you discuss any questions you have with your health care provider.

## 2014-05-23 ENCOUNTER — Other Ambulatory Visit: Payer: Self-pay | Admitting: Family Medicine

## 2014-05-23 ENCOUNTER — Encounter: Payer: Self-pay | Admitting: *Deleted

## 2014-05-23 DIAGNOSIS — N924 Excessive bleeding in the premenopausal period: Secondary | ICD-10-CM

## 2014-05-23 DIAGNOSIS — N946 Dysmenorrhea, unspecified: Secondary | ICD-10-CM

## 2014-05-23 DIAGNOSIS — N921 Excessive and frequent menstruation with irregular cycle: Secondary | ICD-10-CM

## 2014-05-23 LAB — HIV ANTIBODY (ROUTINE TESTING W REFLEX): HIV 1&2 Ab, 4th Generation: NONREACTIVE

## 2014-05-23 LAB — RPR

## 2014-05-23 LAB — HEPATITIS C ANTIBODY: HCV Ab: NEGATIVE

## 2014-05-24 LAB — URINE CULTURE

## 2014-05-25 DIAGNOSIS — R87612 Low grade squamous intraepithelial lesion on cytologic smear of cervix (LGSIL): Secondary | ICD-10-CM | POA: Insufficient documentation

## 2014-05-26 LAB — PAP IG, CT-NG NAA, HPV HIGH-RISK
CHLAMYDIA PROBE AMP: NEGATIVE
GC PROBE AMP: NEGATIVE
HPV DNA High Risk: NOT DETECTED

## 2014-05-27 ENCOUNTER — Ambulatory Visit
Admission: RE | Admit: 2014-05-27 | Discharge: 2014-05-27 | Disposition: A | Discharge status: Home / Self Care | Payer: BLUE CROSS/BLUE SHIELD | Source: Ambulatory Visit | Attending: Family Medicine | Admitting: Family Medicine

## 2014-05-27 DIAGNOSIS — N921 Excessive and frequent menstruation with irregular cycle: Secondary | ICD-10-CM

## 2014-05-27 DIAGNOSIS — N946 Dysmenorrhea, unspecified: Secondary | ICD-10-CM

## 2014-05-27 DIAGNOSIS — N924 Excessive bleeding in the premenopausal period: Secondary | ICD-10-CM

## 2014-05-29 ENCOUNTER — Telehealth: Payer: Self-pay

## 2014-05-29 NOTE — Telephone Encounter (Signed)
Pt called about labs. Unable to see her results on MyChart. Notified of labs. Saw OB/G Tues and they did the colposcopy and faxed the results to you. Hopefully this is in your box. If so, can you please review.  Also pt wants to know if you can send iron supplement to her pharm so that her ins will pay for it Thanks so much

## 2014-06-02 ENCOUNTER — Other Ambulatory Visit: Payer: Self-pay | Admitting: Family Medicine

## 2014-06-02 MED ORDER — FERROUS SULFATE 325 (65 FE) MG PO TABS: 325.0000 mg | ORAL_TABLET | Freq: Every day | ORAL | Status: DC

## 2014-06-02 NOTE — Telephone Encounter (Signed)
Spoke with pt. She was confused. She had an US done. Please review.  Her colposcopy is 3/4 with Dr. Toney Rakes. Advised her about the iron

## 2014-06-02 NOTE — Telephone Encounter (Signed)
I see pt had a vaginal ultrasound last Tues 2/23 but did she actually see a gynecologist w/ colposcopy already? I wonder if pt was confused as was just referring to the [place] but has not actually had a colposcopy yet. . . .    I have not gotten any referral or colposcopy notes.  Pt does have an appointment with Dr. Toney Rakes at [facility] scheduled for 3/4 so if she has already seen her gynecologist she should cancel this appt. Sent iron into pharmacy. Start ferrous sulfate 324mg  (65mg  of elemental iron).  Take this on an empty stomach 30 minutes before eating or 2 hours after a meal.  Take it with a vitamin C supplement or a small glass of orange juice.

## 2014-06-02 NOTE — Telephone Encounter (Signed)
Spoke with pt, advised message from Dr. Brigitte Pulse. Pt undertood and will discuss with her GYN

## 2014-06-02 NOTE — Telephone Encounter (Signed)
[place] results were sent to pt's MyChart - shows that her periods are heavy because the lining of her uterus (which is what sheds as the menstrual cycle) is thicker than normal.  She needs to discuss with Dr. Toney Rakes if anything needs to be done about this or if any further testing is needed.

## 2014-06-06 ENCOUNTER — Ambulatory Visit (INDEPENDENT_AMBULATORY_CARE_PROVIDER_SITE_OTHER): Payer: BLUE CROSS/BLUE SHIELD | Admitting: Gynecology

## 2014-06-06 ENCOUNTER — Encounter: Payer: Self-pay | Admitting: Gynecology

## 2014-06-06 VITALS — BP 118/76 | Ht 64.0 in | Wt 188.0 lb

## 2014-06-06 DIAGNOSIS — N841 Polyp of cervix uteri: Secondary | ICD-10-CM

## 2014-06-06 DIAGNOSIS — R938 Abnormal findings on diagnostic imaging of other specified body structures: Secondary | ICD-10-CM | POA: Diagnosis not present

## 2014-06-06 DIAGNOSIS — R9389 Abnormal findings on diagnostic imaging of other specified body structures: Secondary | ICD-10-CM | POA: Insufficient documentation

## 2014-06-06 DIAGNOSIS — R896 Abnormal cytological findings in specimens from other organs, systems and tissues: Secondary | ICD-10-CM

## 2014-06-06 DIAGNOSIS — IMO0002 Reserved for concepts with insufficient information to code with codable children: Secondary | ICD-10-CM

## 2014-06-06 DIAGNOSIS — N951 Menopausal and female climacteric states: Secondary | ICD-10-CM | POA: Diagnosis not present

## 2014-06-06 NOTE — Progress Notes (Signed)
   Patient is a 44 year old gravida 3 para 3 (prior history of tubal sterilization procedure) who was referred to our practice as a courtesy of her primary care physician Dr. 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 Several months ago should been on oral contraceptive pill but due to financial reasons she had discontinued it and Dr. Hilliard Clark just recently started. She had ordered an ultrasound which was done on for every 23rd which demonstrated normal size uterus but a thickened endometrium with a measurement 25.5 mm. Her ovaries reported to be normal.  She had complained of passing low abdominal discomfort. She denied any GU or GI complaints. Patient also denied any postcoital bleeding. Patient denied any weight changes or any appetite changes. Patient with one relative with colorectal cancer and I was her father.   We went through a detailed discussion on diagnosis and management of abnormal Pap smears as well as irregular vaginal bleeding and perimenopausal patient's. She was counseled to undergo colposcopic evaluation as well as endometrial biopsy.  Patient underwent a detail colposcopic evaluation of the external genitalia, perineum and perirectal region with no lesions seen. The speculum was introduced into the vagina and a systematic inspection of the entire vaginal mucosa did not demonstrate any lesions or on the fornix. Acetic acid was applied and a small endocervical polyp was noted as well as leukoplakic area of the ectocervix at the 7:00 position. With the use of the endocervical speculum the transformation zone was visualized entirely.  With the use of a [place] clamp the cervical polyp was twisted off its pedicle and submitted for histological evaluation. The 7:00 ectocervical leukoplakic lesion was biopsy with a  Kevorkian biopsy instrument and submitted also separately for pathological evaluation. An ECC was also obtained and submitted separately for pathological evaluation.  After this a bimanual examination was undertaken before proceeding with an endometrial biopsy. The uterus was found to be anteverted normal size shape and consistency with no palpable adnexal masses  The cervix was then cleansed with Betadine solution. The CO2 tenaculum was placed on the anterior cervical lip and a sterile Pipelle was introduced into the uterine cavity whereby moderate amount of tissue was obtained and submitted for histological evaluation. The single-tooth tenaculum was removed. The cervical biopsy site small bleed was contained with silver nitrate. Patient tolerated both procedures well and was given an Aleve on the way out of the office.  Assessment/plan: #1 perimenopausal patient with metromenorrhagia recently started oral contraceptive pill was found to have thickened endometrium on ultrasound and for this reason an endometrial biopsy was performed today we'll wait for the results and notify patient with results and management accordingly. She should continue on her oral contraceptive pill for cycle regulation. I have informed her that with a 20 g pill she can continue to the perimenopause and into the menopause up to about the age of 44 with this benefits pros and cons discussed. #2 patient with recent low-grade squamous intraepithelial lesion on Pap smear negative HPV underwent detail colposcopic evaluation small ectocervical leukoplakic area was biopsied along with an ECC will await pathology report and management according to the  ASCCP guidelines #3 cervical polyp to set office pedicle submit if histological evaluation.

## 2014-06-06 NOTE — Addendum Note (Signed)
Addended by: Thurnell Garbe A on: 06/06/2014 03:43 PM   Modules accepted: Orders

## 2014-06-06 NOTE — Patient Instructions (Signed)
Colposcopy Care After Colposcopy is a procedure in which a special tool is used to magnify the surface of the cervix. A tissue sample (biopsy) may also be taken. This sample will be looked at for cervical cancer or other problems. After the test:  You may have some cramping.  Lie down for a few minutes if you feel lightheaded.   You may have some bleeding which should stop in a few days. HOME CARE  Do not have sex or use tampons for 2 to 3 days or as told.  Only take medicine as told by your doctor.  Continue to take your birth control pills as usual. Finding out the results of your test Ask when your test results will be ready. Make sure you get your test results. GET HELP RIGHT AWAY IF:  You are bleeding a lot or are passing blood clots.  You develop a fever of 102 F (38.9 C) or higher.  You have abnormal vaginal discharge.  You have cramps that do not go away with medicine.  You feel lightheaded, dizzy, or pass out (faint). MAKE SURE YOU:   Understand these instructions.  Will watch your condition.  Will get help right away if you are not doing well or get worse. Document Released: 09/07/2007 Document Revised: 06/13/2011 Document Reviewed: 10/18/2012 [facility] Patient Information 2015 [place], [place]. This information is not intended to replace advice given to you by your health care provider. Make sure you discuss any questions you have with your health care provider.

## 2014-06-11 ENCOUNTER — Telehealth: Payer: Self-pay

## 2014-06-11 NOTE — Telephone Encounter (Signed)
Patient said after her visit and the biopsies you did she noticed that when she started her period later that day (it was expected around that time) that she had a lot of clots in it.  She said it has continued since then to contain clots.  She said her flow is slowing down now and it was never heavier than a normal period but she wanted to be sure the clots were okay.

## 2014-06-11 NOTE — Telephone Encounter (Signed)
-----   Message from Terrance Mass, MD sent at 06/10/2014  2:48 PM EST ----- Please inform patient that her cervical biopsy demonstrated low-grade squamous intraepithelial lesion which concurred with her Pap smear. We will ASSCP guidelines recommends repeat Pap smear with cone testing for the HPV virus in one year. Also her endometrial biopsy was benign.

## 2014-06-11 NOTE — Telephone Encounter (Signed)
Reassure.

## 2014-06-12 NOTE — Telephone Encounter (Signed)
Patient reassured.  

## 2014-06-20 ENCOUNTER — Encounter: Payer: Self-pay | Admitting: Family Medicine

## 2014-06-20 ENCOUNTER — Ambulatory Visit (INDEPENDENT_AMBULATORY_CARE_PROVIDER_SITE_OTHER): Payer: BLUE CROSS/BLUE SHIELD | Admitting: Family Medicine

## 2014-06-20 VITALS — BP 123/75 | HR 80 | Temp 97.0°F | Resp 16 | Ht 65.0 in | Wt 182.0 lb

## 2014-06-20 DIAGNOSIS — R1012 Left upper quadrant pain: Secondary | ICD-10-CM | POA: Diagnosis not present

## 2014-06-20 DIAGNOSIS — N946 Dysmenorrhea, unspecified: Secondary | ICD-10-CM | POA: Diagnosis not present

## 2014-06-20 DIAGNOSIS — K5900 Constipation, unspecified: Secondary | ICD-10-CM

## 2014-06-20 DIAGNOSIS — K2970 Gastritis, unspecified, without bleeding: Secondary | ICD-10-CM | POA: Diagnosis not present

## 2014-06-20 DIAGNOSIS — IMO0002 Reserved for concepts with insufficient information to code with codable children: Secondary | ICD-10-CM

## 2014-06-20 DIAGNOSIS — K2990 Gastroduodenitis, unspecified, without bleeding: Secondary | ICD-10-CM | POA: Diagnosis not present

## 2014-06-20 DIAGNOSIS — N920 Excessive and frequent menstruation with regular cycle: Secondary | ICD-10-CM | POA: Diagnosis not present

## 2014-06-20 DIAGNOSIS — K219 Gastro-esophageal reflux disease without esophagitis: Secondary | ICD-10-CM | POA: Diagnosis not present

## 2014-06-20 DIAGNOSIS — R896 Abnormal cytological findings in specimens from other organs, systems and tissues: Secondary | ICD-10-CM

## 2014-06-20 DIAGNOSIS — D509 Iron deficiency anemia, unspecified: Secondary | ICD-10-CM

## 2014-06-20 MED ORDER — POLYETHYLENE GLYCOL 3350 17 GM/SCOOP PO POWD: 17.0000 g | Freq: Every day | ORAL | Status: DC

## 2014-06-20 MED ORDER — PANTOPRAZOLE SODIUM 40 MG PO TBEC: 40.0000 mg | DELAYED_RELEASE_TABLET | Freq: Every day | ORAL | Status: DC

## 2014-06-20 MED ORDER — MELOXICAM 15 MG PO TABS: 15.0000 mg | ORAL_TABLET | Freq: Every day | ORAL | Status: DC

## 2014-06-20 NOTE — Progress Notes (Signed)
Subjective:    Patient ID: Kellie Curtis, female    DOB: 17-Nov-1970, 44 y.o.   MRN: [id number] This chart was scribed for Delman Cheadle, MD by Zola Button, Medical Scribe. This patient was seen in Room 25 and the patient's care was started at 4:40 PM.   Chief Complaint  Patient presents with  . following up after referrals    HPI HPI Comments: Kellie Curtis is a 44 y.o. female with a hx of GERD who presents to the [facility] for a follow-up.  Last saw her 1 month ago for heavy menstrual periods and back pain. She had an abnormal pap smear at that visit, so I referred her to gynecology, where they did a colposcopy as well as an endometrial biopsy. She was advised to continue on her OCP, which she could continue on until 45 years of age to treat for perimenopausal symptoms. Her endometrial biopsy was benign. Her colposcopy confirmed LGSIL. Guidelines recommend pap smear retesting after 1 year with co-testing for HPV. At her last visit, patient had low iron levels with mild anemia, so was advised to start iron supplement and high iron diet. She was also put on a GERD diet.  Heavy Menstrual Periods: She has started the iron supplement. Patient still has heavy menses and some intermittent left-sided, cramping abdominal pain secondary to her menstrual cycles. She denies lightheadedness and dizziness.   GERD/Constipation: Patient still has heartburn/indigestion. She has also started a fiber regiment and has been eating Metamucil cookies every morning. Patient does not make a bowel movement every day; she has bowel movements about every 2 days.  Past Medical History  Diagnosis Date  . Learning disorder   . Seizure disorder   . Personal history of colonic polyps 05/12/2010    TUBULAR ADENOMA  . Abdominal pain, left upper quadrant   . Hiatal hernia   . Abdominal pain, left lower quadrant   . Unspecified constipation   . Abdominal pain, left upper quadrant   . Abdominal pain,  epigastric   . Wheezing   . Chest pain, unspecified   . Fever, unspecified   . Attention deficit disorder with hyperactivity(314.01)   . Elevated blood pressure reading without diagnosis of hypertension   . Depressive disorder, not elsewhere classified   . Anxiety state, unspecified   . Other convulsions   . Backache, unspecified   . Chest pain, unspecified   . Acute sinusitis, unspecified   . Other and unspecified hyperlipidemia   . IBS (irritable bowel syndrome)   . Depression 10/05/2012  . Anemia    Past Surgical History  Procedure Laterality Date  . Cesarean section      X 2  . Tonsillectomy    . Tubal ligation     Current Outpatient Prescriptions on File Prior to Visit  Medication Sig Dispense Refill  . albuterol (PROVENTIL HFA;VENTOLIN HFA) 108 (90 BASE) MCG/ACT inhaler Inhale 2 puffs into the lungs every 6 (six) hours as needed for wheezing or shortness of breath. 1 Inhaler 2  . ferrous sulfate 325 (65 FE) MG tablet Take 1 tablet (325 mg total) by mouth daily with breakfast. 90 tablet 1  . levonorgestrel-ethinyl estradiol (AVIANE,ALESSE,LESSINA) 0.1-20 MG-MCG tablet Take 1 tablet by mouth daily. 1 Package 5  . methylphenidate (DAYTRANA) 20 MG/9HR Place 1 patch onto the skin daily. wear patch for 9 hours only each day     No current facility-administered medications on file prior to visit.   Allergies  Allergen  Reactions  . Hydrocodone-Homatropine Itching   Family History  Problem Relation Age of Onset  . Stroke Father   . Hypertension Father   . Gout Father   . Hyperlipidemia Father   . Cancer Father     COLON  . Alcohol abuse      grandfather  . Breast cancer      grandmother  . Hypertension      grandmother  . Asthma      grandmother  . Asthma Mother   . Colon cancer Neg Hx   . Breast cancer Maternal Grandmother    History   Social History  . Marital Status: Single    Spouse Name: N/A  . Number of Children: 3  . Years of Education: N/A    Occupational History  .  [place] History Main Topics  . Smoking status: Never Smoker   . Smokeless tobacco: Never Used  . Alcohol Use: 1.2 oz/week    2 Cans of beer per week     Comment: per week  . Drug Use: No  . Sexual Activity: Yes    Birth Control/ Protection: Abstinence     Comment: 1ST INTERCOURSE- 15, PARTNERS- GREATER THAN 5   Other Topics Concern  . None   Social History Narrative     Review of Systems  Constitutional: Positive for diaphoresis, appetite change, fatigue and unexpected weight change. Negative for fever, chills and activity change.  Gastrointestinal: Positive for abdominal pain and constipation.  Genitourinary: Positive for menstrual problem.  Musculoskeletal: Positive for back pain and arthralgias. Negative for gait problem.  Skin: Negative for rash.  Neurological: Negative for dizziness and light-headedness.  Psychiatric/Behavioral: The patient is nervous/anxious.        Objective:  BP 123/75 mmHg  Pulse 80  Temp(Src) 97 F (36.1 C) (Oral)  Resp 16  Ht 5\' 5"  (1.651 m)  Wt 182 lb (82.555 kg)  BMI 30.29 kg/m2  SpO2 97%  LMP 06/13/2014  Physical Exam  Constitutional: She is oriented to person, place, and time. She appears well-developed and well-nourished. No distress.  HENT:  Head: Normocephalic and atraumatic.  Mouth/Throat: Oropharynx is clear and moist. No oropharyngeal exudate.  Eyes: Pupils are equal, round, and reactive to light.  Neck: Neck supple.  Cardiovascular: Normal rate, regular rhythm, S1 normal, S2 normal and normal heart sounds.   No murmur heard. Pulmonary/Chest: Effort normal and breath sounds normal. No respiratory distress. She has no wheezes. She has no rales.  CTAB. Good air movement.  Abdominal: Bowel sounds are normal. There is no CVA tenderness.  Musculoskeletal: She exhibits no edema.  Neurological: She is alert and oriented to person, place, and time. No cranial nerve deficit.  Skin: Skin is  warm and dry. No rash noted.  Psychiatric: She has a normal mood and affect. Her behavior is normal.  Nursing note and vitals reviewed.         Assessment & Plan:   Gastroesophageal reflux disease, esophagitis presence not specified  Anemia, iron deficiency  LGSIL (low grade squamous intraepithelial dysplasia)  Menorrhagia with regular cycle  Abdominal pain, left upper quadrant  Gastritis and gastroduodenitis  Dysmenorrhea  Constipation, unspecified constipation type  Meds ordered this encounter  Medications  . polyethylene glycol powder (GLYCOLAX/MIRALAX) powder    Sig: Take 17 g by mouth daily.    Dispense:  255 g    Refill:  11  . meloxicam (MOBIC) 15 MG tablet    Sig: Take 1  tablet (15 mg total) by mouth daily. On day 18-28 of menstrual cycle    Dispense:  30 tablet    Refill:  4  . pantoprazole (PROTONIX) 40 MG tablet    Sig: Take 1 tablet (40 mg total) by mouth daily. 30 min before breakfast    Dispense:  30 tablet    Refill:  1    I personally performed the services described in this documentation, which was scribed in my presence. The recorded information has been reviewed and considered, and addended by me as needed.  Delman Cheadle, MD MPH

## 2014-06-20 NOTE — Patient Instructions (Signed)
About Constipation  Constipation Overview Constipation is the most common gastrointestinal complaint - about 4 million Americans experience constipation and make 2.5 million physician visits a year to get help for the problem.  Constipation can occur when the colon absorbs too much water, the colon's muscle contraction is slow or sluggish, and/or there is delayed transit time through the colon.  The result is stool that is hard and dry.  Indicators of constipation include straining during bowel movements greater than 25% of the time, having fewer than three bowel movements per week, and/or the feeling of incomplete evacuation.  There are established guidelines ([place] II ) for defining constipation. A person needs to have two or more of the following symptoms for at least 12 weeks (not necessarily consecutive) in the preceding 12 months: . Straining in  greater than 25% of bowel movements . Lumpy or hard stools in greater than 25% of bowel movements . Sensation of incomplete emptying in greater than 25% of bowel movements . Sensation of anorectal obstruction/blockade in greater than 25% of bowel movements . Manual maneuvers to help empty greater than 25% of bowel movements (e.g., digital evacuation, support of the pelvic floor)  . Less than  3 bowel movements/week . Loose stools are not present, and criteria for irritable bowel syndrome are insufficient  Common Causes of Constipation . Lack of fiber in your diet . Lack of physical activity . Medications, including iron and calcium supplements  . Dairy intake . Dehydration . Abuse of laxatives  Travel  Irritable Bowel Syndrome  Pregnancy  Luteal phase of menstruation (after ovulation and before menses)  Colorectal problems  Intestinal Dysfunction  Treating Constipation  There are several ways of treating constipation, including changes to diet and exercise, use of laxatives, adjustments to the pelvic floor, and scheduled toileting.   These treatments include: . increasing fiber and fluids in the diet  . increasing physical activity . learning muscle coordination   learning proper toileting techniques and toileting modifications   designing and sticking  to a toileting schedule     2007, [employer] Doc.22  About Constipation  Constipation Overview Constipation is the most common gastrointestinal complaint - about 4 million Americans experience constipation and make 2.5 million physician visits a year to get help for the problem.  Constipation can occur when the colon absorbs too much water, the colon's muscle contraction is slow or sluggish, and/or there is delayed transit time through the colon.  The result is stool that is hard and dry.  Indicators of constipation include straining during bowel movements greater than 25% of the time, having fewer than three bowel movements per week, and/or the feeling of incomplete evacuation.  There are established guidelines ([place] II ) for defining constipation. A person needs to have two or more of the following symptoms for at least 12 weeks (not necessarily consecutive) in the preceding 12 months: . Straining in  greater than 25% of bowel movements . Lumpy or hard stools in greater than 25% of bowel movements . Sensation of incomplete emptying in greater than 25% of bowel movements . Sensation of anorectal obstruction/blockade in greater than 25% of bowel movements . Manual maneuvers to help empty greater than 25% of bowel movements (e.g., digital evacuation, support of the pelvic floor)  . Less than  3 bowel movements/week . Loose stools are not present, and criteria for irritable bowel syndrome are insufficient  Common Causes of Constipation . Lack of fiber in your diet . Lack of physical  activity . Medications, including iron and calcium supplements  . Dairy intake . Dehydration . Abuse of laxatives  Travel  Irritable Bowel  Syndrome  Pregnancy  Luteal phase of menstruation (after ovulation and before menses)  Colorectal problems  Intestinal Dysfunction  Treating Constipation  There are several ways of treating constipation, including changes to diet and exercise, use of laxatives, adjustments to the pelvic floor, and scheduled toileting.  These treatments include: . increasing fiber and fluids in the diet  . increasing physical activity . learning muscle coordination   learning proper toileting techniques and toileting modifications   designing and sticking  to a toileting schedule     2007, [employer] Doc.22  Food Choices for Gastroesophageal Reflux Disease When you have gastroesophageal reflux disease (GERD), the foods you eat and your eating habits are very important. Choosing the right foods can help ease the discomfort of GERD. WHAT GENERAL GUIDELINES DO I NEED TO FOLLOW?  Choose fruits, vegetables, whole grains, low-fat dairy products, and low-fat meat, fish, and poultry.  Limit fats such as oils, salad dressings, butter, nuts, and avocado.  Keep a food diary to identify foods that cause symptoms.  Avoid foods that cause reflux. These may be different for different people.  Eat frequent small meals instead of three large meals each day.  Eat your meals slowly, in a relaxed setting.  Limit fried foods.  Cook foods using methods other than frying.  Avoid drinking alcohol.  Avoid drinking large amounts of liquids with your meals.  Avoid bending over or lying down until 2-3 hours after eating. WHAT FOODS ARE NOT RECOMMENDED? The following are some foods and drinks that may worsen your symptoms: Vegetables Tomatoes. Tomato juice. Tomato and spaghetti sauce. Chili peppers. Onion and garlic. Horseradish. Fruits Oranges, grapefruit, and lemon (fruit and juice). Meats High-fat meats, fish, and poultry. This includes hot dogs, ribs, ham, sausage, salami, and  bacon. Dairy Whole milk and chocolate milk. Sour cream. Cream. Butter. Ice cream. Cream cheese.  Beverages Coffee and tea, with or without caffeine. Carbonated beverages or energy drinks. Condiments Hot sauce. Barbecue sauce.  Sweets/Desserts Chocolate and cocoa. Donuts. Peppermint and spearmint. Fats and Oils High-fat foods, including [place] fries and potato chips. Other Vinegar. Strong spices, such as black pepper, white pepper, red pepper, cayenne, curry powder, cloves, ginger, and chili powder. The items listed above may not be a complete list of foods and beverages to avoid. Contact your dietitian for more information. Document Released: 03/21/2005 Document Revised: 03/26/2013 Document Reviewed: 01/23/2013 [facility] Patient Information 2015 [place], [place]. This information is not intended to replace advice given to you by your health care provider. Make sure you discuss any questions you have with your health care provider.   Dysmenorrhea Menstrual cramps (dysmenorrhea) are caused by the muscles of the uterus tightening (contracting) during a menstrual period. For some women, this discomfort is merely bothersome. For others, dysmenorrhea can be severe enough to interfere with everyday activities for a few days each month. Primary dysmenorrhea is menstrual cramps that last a couple of days when you start having menstrual periods or soon after. This often begins after a teenager starts having her period. As a woman gets older or has a baby, the cramps will usually lessen or disappear. Secondary dysmenorrhea begins later in life, lasts longer, and the pain may be stronger than primary dysmenorrhea. The pain may start before the period and last a few days after the period.  CAUSES  Dysmenorrhea is usually caused by  an underlying problem, such as:  The tissue lining the uterus grows outside of the uterus in other areas of the body (endometriosis).  The endometrial tissue, which normally  lines the uterus, is found in or grows into the muscular walls of the uterus (adenomyosis).  The pelvic blood vessels are engorged with blood just before the menstrual period (pelvic congestive syndrome).  Overgrowth of cells (polyps) in the lining of the uterus or cervix.  Falling down of the uterus (prolapse) because of loose or stretched ligaments.  Depression.  Bladder problems, infection, or inflammation.  Problems with the intestine, a tumor, or irritable bowel syndrome.  Cancer of the female organs or bladder.  A severely tipped uterus.  A very tight opening or closed cervix.  Noncancerous tumors of the uterus (fibroids).  Pelvic inflammatory disease (PID).  Pelvic scarring (adhesions) from a previous surgery.  Ovarian cyst.  An intrauterine device (IUD) used for birth control. RISK FACTORS You may be at greater risk of dysmenorrhea if:  You are younger than age 110.  You started puberty early.  You have irregular or heavy bleeding.  You have never given birth.  You have a family history of this problem.  You are a smoker. SIGNS AND SYMPTOMS   Cramping or throbbing pain in your lower abdomen.  Headaches.  Lower back pain.  Nausea or vomiting.  Diarrhea.  Sweating or dizziness.  Loose stools. DIAGNOSIS  A diagnosis is based on your history, symptoms, physical exam, diagnostic tests, or procedures. Diagnostic tests or procedures may include:  Blood tests.  Ultrasonography.  An examination of the lining of the uterus (dilation and curettage, D&C).  An examination inside your abdomen or pelvis with a scope (laparoscopy).  X-rays.  CT scan.  MRI.  An examination inside the bladder with a scope (cystoscopy).  An examination inside the intestine or stomach with a scope (colonoscopy, gastroscopy). TREATMENT  Treatment depends on the cause of the dysmenorrhea. Treatment may include:  Pain medicine prescribed by your health care  provider.  Birth control pills or an IUD with progesterone hormone in it.  Hormone replacement therapy.  Nonsteroidal anti-inflammatory drugs (NSAIDs). These may help stop the production of prostaglandins.  Surgery to remove adhesions, endometriosis, ovarian cyst, or fibroids.  Removal of the uterus (hysterectomy).  Progesterone shots to stop the menstrual period.  Cutting the nerves on the sacrum that go to the female organs (presacral neurectomy).  Electric current to the sacral nerves (sacral nerve stimulation).  Antidepressant medicine.  Psychiatric therapy, counseling, or group therapy.  Exercise and physical therapy.  Meditation and yoga therapy.  Acupuncture. HOME CARE INSTRUCTIONS   Only take over-the-counter or prescription medicines as directed by your health care provider.  Place a heating pad or hot water bottle on your lower back or abdomen. Do not sleep with the heating pad.  Use aerobic exercises, walking, swimming, biking, and other exercises to help lessen the cramping.  Massage to the lower back or abdomen may help.  Stop smoking.  Avoid alcohol and caffeine. SEEK MEDICAL CARE IF:   Your pain does not get better with medicine.  You have pain with sexual intercourse.  Your pain increases and is not controlled with medicines.  You have abnormal vaginal bleeding with your period.  You develop nausea or vomiting with your period that is not controlled with medicine. SEEK IMMEDIATE MEDICAL CARE IF:  You pass out.  Document Released: 03/21/2005 Document Revised: 11/21/2012 Document Reviewed: 09/06/2012 [facility] Patient Information 2015 [place],  [place]. This information is not intended to replace advice given to you by your health care provider. Make sure you discuss any questions you have with your health care provider.  

## 2014-06-23 ENCOUNTER — Telehealth: Payer: Self-pay

## 2014-06-23 NOTE — Telephone Encounter (Signed)
Pt says Protonix is not covered by her insurance and was wondering if there is another option that would be covered.  Pt said it is okay to leave a VM.

## 2014-06-23 NOTE — Telephone Encounter (Signed)
She needs to call her insurance to ask what they will cover. Or the pharmacist. Or get a copy of her insurances formulary and bring it with her when she comes back to see me next. I have no way to know of the 50 million different insurance plans which meds are on whichever plan she has. If she doesn't want to do that, she can purchase generic over the counter - but will need to take the otc ones bid rather than the rx once daily.  Of the stomach/acid reflux meds- she can use anything that has the end of the generic drug name of "prazole" (pantoprazole, esomeprazole, omeprazole, lansoprazole, rabeprazole, etc)

## 2014-06-23 NOTE — Telephone Encounter (Signed)
Pt states she was given two stomach meds and one was too expensive to fill,she needs alternative,has Other questions re meds also   Best phone for pt is [phone]

## 2014-06-24 NOTE — Telephone Encounter (Signed)
Called pt's pharmacy to see which medication they will cover. The pharmacy tried to send through omeprazole and nexium and they still will not cover. I have to call insurance.

## 2014-06-24 NOTE — Telephone Encounter (Signed)
Left message for pt to call back  °

## 2014-06-27 NOTE — Telephone Encounter (Signed)
Left a detailed message letting pt know her insurance will not cover any medications for GERD. They want her to try the OTC medications Prilosec or Nexium. Advised to call me on Monday if she has any questions.

## 2014-06-30 ENCOUNTER — Telehealth: Payer: Self-pay

## 2014-06-30 NOTE — Telephone Encounter (Addendum)
Pt states she was given a power for her IBS, but it isn't working, the pharmacy stated she may have to up her dose or take it more often in a day. Please call [phone]     [facility] [address]

## 2014-07-01 NOTE — Telephone Encounter (Signed)
MIRALAX powder can take 2-4d to work but can increase to twice daily.  Consider starting evening magnesium supp and or adding in prn sennokot S.  If she contues to have problems can rtc to eval further

## 2014-07-02 NOTE — Telephone Encounter (Signed)
Left message for pt to call back  °

## 2014-07-02 NOTE — Telephone Encounter (Signed)
Spoke with pt, advised message from Dr. Shaw. Pt understood. 

## 2014-07-04 ENCOUNTER — Ambulatory Visit (INDEPENDENT_AMBULATORY_CARE_PROVIDER_SITE_OTHER): Payer: BLUE CROSS/BLUE SHIELD | Admitting: Family Medicine

## 2014-07-04 VITALS — BP 110/72 | HR 74 | Temp 98.7°F | Resp 18 | Ht 65.0 in | Wt 188.0 lb

## 2014-07-04 DIAGNOSIS — K219 Gastro-esophageal reflux disease without esophagitis: Secondary | ICD-10-CM | POA: Diagnosis not present

## 2014-07-04 DIAGNOSIS — R1013 Epigastric pain: Secondary | ICD-10-CM

## 2014-07-04 LAB — COMPREHENSIVE METABOLIC PANEL
ALT: 9 U/L (ref 0–35)
AST: 12 U/L (ref 0–37)
Albumin: 4 g/dL (ref 3.5–5.2)
Alkaline Phosphatase: 22 U/L — ABNORMAL LOW (ref 39–117)
BILIRUBIN TOTAL: 0.4 mg/dL (ref 0.2–1.2)
BUN: 14 mg/dL (ref 6–23)
CHLORIDE: 108 meq/L (ref 96–112)
CO2: 26 meq/L (ref 19–32)
Calcium: 8.7 mg/dL (ref 8.4–10.5)
Creat: 0.78 mg/dL (ref 0.50–1.10)
GLUCOSE: 91 mg/dL (ref 70–99)
Potassium: 4.7 mEq/L (ref 3.5–5.3)
SODIUM: 143 meq/L (ref 135–145)
Total Protein: 6.9 g/dL (ref 6.0–8.3)

## 2014-07-04 LAB — POCT CBC
Granulocyte percent: 51.7 %G (ref 37–80)
HCT, POC: 36.8 % — AB (ref 37.7–47.9)
Hemoglobin: 11.2 g/dL — AB (ref 12.2–16.2)
Lymph, poc: 1.9 (ref 0.6–3.4)
MCH: 27.8 pg (ref 27–31.2)
MCHC: 30.4 g/dL — AB (ref 31.8–35.4)
MCV: 91.2 fL (ref 80–97)
MID (CBC): 0.3 (ref 0–0.9)
MPV: 7.2 fL (ref 0–99.8)
PLATELET COUNT, POC: 322 10*3/uL (ref 142–424)
POC Granulocyte: 2.4 (ref 2–6.9)
POC LYMPH %: 41.2 % (ref 10–50)
POC MID %: 7.1 % (ref 0–12)
RBC: 4.04 M/uL (ref 4.04–5.48)
RDW, POC: 17.6 %
WBC: 4.7 10*3/uL (ref 4.6–10.2)

## 2014-07-04 LAB — POCT UA - MICROSCOPIC ONLY
BACTERIA, U MICROSCOPIC: NEGATIVE
CASTS, UR, LPF, POC: NEGATIVE
Crystals, Ur, HPF, POC: NEGATIVE
Yeast, UA: NEGATIVE

## 2014-07-04 LAB — LIPASE: LIPASE: 20 U/L (ref 0–75)

## 2014-07-04 LAB — POCT URINALYSIS DIPSTICK
BILIRUBIN UA: NEGATIVE
Blood, UA: NEGATIVE
GLUCOSE UA: NEGATIVE
KETONES UA: NEGATIVE
Leukocytes, UA: NEGATIVE
Nitrite, UA: NEGATIVE
PROTEIN UA: 30
Urobilinogen, UA: 0.2
pH, UA: 5

## 2014-07-04 LAB — POCT URINE PREGNANCY: Preg Test, Ur: NEGATIVE

## 2014-07-04 MED ORDER — SUCRALFATE 1 G PO TABS: 1.0000 g | ORAL_TABLET | Freq: Three times a day (TID) | ORAL | Status: DC

## 2014-07-04 MED ORDER — RANITIDINE HCL 150 MG PO TABS
150.0000 mg | ORAL_TABLET | Freq: Two times a day (BID) | ORAL | Status: DC
  Administered 2014-07-04: 150 mg via ORAL

## 2014-07-04 NOTE — Progress Notes (Addendum)
[facility] [address] [place] [address] [phone]  Date:  07/04/2014   Name:  Kellie Curtis   DOB:  03/25/1971   MRN:  [id number]  PCP:  Delman Cheadle, MD    Chief Complaint: Abdominal Pain   History of Present Illness:  Kellie Curtis is a 44 y.o. very pleasant female patient who presents with the following:  Here today with complaint of abdominal pain to the left of her umbilicus.  She has noted it more over the last 5- 6 days but the pain has been present for 1-2 years overall.  It may wake her up at night with "a really sharp pain."  Turning over in bed seems to help.  She feels it more when laying on her stomach  She does have a history of IBS and GERD but this seems different to her  Sometimes the area is tender to palpation, sometimes not.    She has not noted any nausea or vomiting, but has noted some sx from her GERD.  She does have some diarrhea  She could not afford protonix, but is taking an OTC medication for GERD- ompetrazole.   She does not have any dysuria.    LMP 06/13/2014  She had endoscopy and colonosocpy about 4 years ago.   She was noted to have pelvic pain/ mennorhagia not long ago and had an [place] which showed thicked endometrial lining. She then saw Dr. Toney Rakes about a month ago and underwent colposcopy with bx and endometrial bx.    Patient Active Problem List   Diagnosis Date Noted  . Perimenopause 06/06/2014  . Thickened endometrium 06/06/2014  . Low grade squamous intraepithelial lesion (LGSIL) on Papanicolaou smear of cervix 05/25/2014  . Hemoptysis 05/10/2013  . Acute bronchitis 01/11/2013  . Chest pain 01/11/2013  . Anemia, unspecified 10/04/2012  . Allergic rhinitis, cause unspecified 03/10/2012  . Irregular menses 05/30/2011  . Irritable bowel syndrome with constipation 05/30/2011  . Preventative health care 05/28/2011  . GERD 05/25/2010  . HIATAL HERNIA WITH REFLUX 05/21/2010  . COLONIC POLYPS, HX OF 05/21/2010   . CONSTIPATION 04/27/2010  . ANXIETY 03/14/2008  . DEPRESSION 03/14/2008  . ADHD 03/14/2008  . SEIZURE DISORDER 02/13/2008  . HYPERLIPIDEMIA 02/07/2008  . BACK PAIN 02/07/2008    Past Medical History  Diagnosis Date  . Learning disorder   . Seizure disorder   . Personal history of colonic polyps 05/12/2010    TUBULAR ADENOMA  . Abdominal pain, left upper quadrant   . Hiatal hernia   . Abdominal pain, left lower quadrant   . Unspecified constipation   . Abdominal pain, left upper quadrant   . Abdominal pain, epigastric   . Wheezing   . Chest pain, unspecified   . Fever, unspecified   . Attention deficit disorder with hyperactivity(314.01)   . Elevated blood pressure reading without diagnosis of hypertension   . Depressive disorder, not elsewhere classified   . Anxiety state, unspecified   . Other convulsions   . Backache, unspecified   . Chest pain, unspecified   . Acute sinusitis, unspecified   . Other and unspecified hyperlipidemia   . IBS (irritable bowel syndrome)   . Depression 10/05/2012  . Anemia     Past Surgical History  Procedure Laterality Date  . Cesarean section      X 2  . Tonsillectomy    . Tubal ligation      History  Substance Use Topics  . Smoking status:  Never Smoker   . Smokeless tobacco: Never Used  . Alcohol Use: 1.2 oz/week    2 Cans of beer per week     Comment: per week    Family History  Problem Relation Age of Onset  . Stroke Father   . Hypertension Father   . Gout Father   . Hyperlipidemia Father   . Cancer Father     COLON  . Alcohol abuse      grandfather  . Breast cancer      grandmother  . Hypertension      grandmother  . Asthma      grandmother  . Asthma Mother   . Colon cancer Neg Hx   . Breast cancer Maternal Grandmother     Allergies  Allergen Reactions  . Hydrocodone-Homatropine Itching    Medication list has been reviewed and updated.  Current Outpatient Prescriptions on File Prior to Visit   Medication Sig Dispense Refill  . albuterol (PROVENTIL HFA;VENTOLIN HFA) 108 (90 BASE) MCG/ACT inhaler Inhale 2 puffs into the lungs every 6 (six) hours as needed for wheezing or shortness of breath. 1 Inhaler 2  . ferrous sulfate 325 (65 FE) MG tablet Take 1 tablet (325 mg total) by mouth daily with breakfast. 90 tablet 1  . levonorgestrel-ethinyl estradiol (AVIANE,ALESSE,LESSINA) 0.1-20 MG-MCG tablet Take 1 tablet by mouth daily. 1 Package 5  . meloxicam (MOBIC) 15 MG tablet Take 1 tablet (15 mg total) by mouth daily. On day 18-28 of menstrual cycle 30 tablet 4  . polyethylene glycol powder (GLYCOLAX/MIRALAX) powder Take 17 g by mouth daily. 255 g 11  . methylphenidate (DAYTRANA) 20 MG/9HR Place 1 patch onto the skin daily. wear patch for 9 hours only each day    . pantoprazole (PROTONIX) 40 MG tablet Take 1 tablet (40 mg total) by mouth daily. 30 min before breakfast (Patient not taking: Reported on 07/04/2014) 30 tablet 1   No current facility-administered medications on file prior to visit.    Review of Systems:  As per HPI- otherwise negative.   Physical Examination: Filed Vitals:   07/04/14 0900  BP: 110/72  Pulse: 74  Temp: 98.7 F (37.1 C)  Resp: 18   Filed Vitals:   07/04/14 0900  Height: 5\' 5"  (1.651 m)  Weight: 188 lb (85.276 kg)   Body mass index is 31.28 kg/(m^2). Ideal Body Weight: Weight in (lb) to have BMI = 25: 149.9  GEN: WDWN, NAD, Non-toxic, A & O x 3, overweight, looks well HEENT: Atraumatic, Normocephalic. Neck supple. No masses, No LAD. Ears and Nose: No external deformity. CV: RRR, No M/G/R. No JVD. No thrill. No extra heart sounds. PULM: CTA B, no wheezes, crackles, rhonchi. No retractions. No resp. distress. No accessory muscle use. ABD: S, ND, +BS. No rebound. No HSM.  She notes mild tenderness left of the umbilicus.  No other areas of tenderness  EXTR: No c/c/e NEURO Normal gait.  PSYCH: Normally interactive. Conversant. Not depressed or anxious  appearing.  Calm demeanor.   Results for orders placed or performed in visit on 07/04/14  Comprehensive metabolic panel  Result Value Ref Range   Sodium 143 135 - 145 mEq/L   Potassium 4.7 3.5 - 5.3 mEq/L   Chloride 108 96 - 112 mEq/L   CO2 26 19 - 32 mEq/L   Glucose, Bld 91 70 - 99 mg/dL   BUN 14 6 - 23 mg/dL   Creat 0.78 0.50 - 1.10 mg/dL   Total Bilirubin 0.4  0.2 - 1.2 mg/dL   Alkaline Phosphatase 22 (L) 39 - 117 U/L   AST 12 0 - 37 U/L   ALT 9 0 - 35 U/L   Total Protein 6.9 6.0 - 8.3 g/dL   Albumin 4.0 3.5 - 5.2 g/dL   Calcium 8.7 8.4 - 10.5 mg/dL  Lipase  Result Value Ref Range   Lipase 20 0 - 75 U/L  POCT CBC  Result Value Ref Range   WBC 4.7 4.6 - 10.2 K/uL   Lymph, poc 1.9 0.6 - 3.4   POC LYMPH PERCENT 41.2 10 - 50 %L   MID (cbc) 0.3 0 - 0.9   POC MID % 7.1 0 - 12 %M   POC Granulocyte 2.4 2 - 6.9   Granulocyte percent 51.7 37 - 80 %G   RBC 4.04 4.04 - 5.48 M/uL   Hemoglobin 11.2 (A) 12.2 - 16.2 g/dL   HCT, POC 36.8 (A) 37.7 - 47.9 %   MCV 91.2 80 - 97 fL   MCH, POC 27.8 27 - 31.2 pg   MCHC 30.4 (A) 31.8 - 35.4 g/dL   RDW, POC 17.6 %   Platelet Count, POC 322 142 - 424 K/uL   MPV 7.2 0 - 99.8 fL  POCT UA - Microscopic Only  Result Value Ref Range   WBC, Ur, HPF, POC 0-4    RBC, urine, microscopic 0-1    Bacteria, U Microscopic neg    Mucus, UA large    Epithelial cells, urine per micros 2-5    Crystals, Ur, HPF, POC neg    Casts, Ur, LPF, POC neg    Yeast, UA neg   POCT urinalysis dipstick  Result Value Ref Range   Color, UA yellow    Clarity, UA hazy    Glucose, UA neg    Bilirubin, UA neg    Ketones, UA neg    Spec Grav, UA >=1.030    Blood, UA neg    pH, UA 5.0    Protein, UA 30    Urobilinogen, UA 0.2    Nitrite, UA neg    Leukocytes, UA Negative   POCT urine pregnancy  Result Value Ref Range   Preg Test, Ur Negative      Assessment and Plan: Epigastric pain - Plan: POCT CBC, POCT UA - Microscopic Only, POCT urinalysis dipstick, POCT  urine pregnancy, Comprehensive metabolic panel, Lipase, ranitidine (ZANTAC) tablet 150 mg, CT Abdomen Pelvis W Contrast, CANCELED: CT Abdomen Pelvis W Contrast  Gastroesophageal reflux disease without esophagitis - Plan: sucralfate (CARAFATE) 1 G tablet, CT Abdomen Pelvis W Contrast  Here today with vague abdominal discomfort of some duration, worse for about the last week.  Due to location of pain I doubt any dangerous pathology such as appendicitis or diverticulitis.  However, as her pain as gone on for a long time need to try and determin cause if possible Had planned a CT today but she is not able to do this due to other commitments.  Low suspicion of acute diverticulitis due to the duration of her sx and normal wbc count.  Will start carafate for likely GERD/ gastritis, continue PPI.  Await other labs and CT scheduled for Monday.  If all unrevealing will refer back to GI for possible endoscopy  She will seek care right away if any change or worsening of her symptoms  Signed Lamar Blinks, MD  Called 4/2 and let her know other labs are normal.

## 2014-07-04 NOTE — Patient Instructions (Addendum)
I think that your main problem may be your stomach- we are going to add carafate to your regimen. Take it prior to meals and bed for 7- 10 days We will get your CT scan on Monday; if anything gets worse in the meantime please let me know! I will also be in touch with your labs  You could also have "diverticulitis"- this is an infection of tiny pockets in your colon.  I think this is less likely but the CT scan will rule it out

## 2014-07-07 ENCOUNTER — Ambulatory Visit (INDEPENDENT_AMBULATORY_CARE_PROVIDER_SITE_OTHER): Payer: BLUE CROSS/BLUE SHIELD

## 2014-07-07 ENCOUNTER — Ambulatory Visit (INDEPENDENT_AMBULATORY_CARE_PROVIDER_SITE_OTHER): Payer: BLUE CROSS/BLUE SHIELD | Admitting: Physician Assistant

## 2014-07-07 VITALS — BP 133/82 | HR 83 | Temp 98.2°F | Resp 17 | Ht 65.5 in | Wt 191.0 lb

## 2014-07-07 DIAGNOSIS — M25562 Pain in left knee: Secondary | ICD-10-CM

## 2014-07-07 DIAGNOSIS — S86112A Strain of other muscle(s) and tendon(s) of posterior muscle group at lower leg level, left leg, initial encounter: Secondary | ICD-10-CM | POA: Diagnosis not present

## 2014-07-07 MED ORDER — HYDROCODONE-ACETAMINOPHEN 5-325 MG PO TABS: 1.0000 | ORAL_TABLET | Freq: Four times a day (QID) | ORAL | Status: DC | PRN
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
## 2014-07-07 NOTE — Patient Instructions (Addendum)
Appointment with Dr Rip Harbour at [facility] Wednesday 07/09/14 arrive at 2:45 pm for 3:15 pm appointment.  Bring insurance and picture ID  Please use the crutches until you see them. Please wear the knee immobilizer until you see them.  Try to ice and elevate often over the next few days.  Please take the norco as needed for pain over the next few days.

## 2014-07-08 ENCOUNTER — Encounter: Payer: Self-pay | Admitting: Family Medicine

## 2014-07-08 ENCOUNTER — Encounter (HOSPITAL_COMMUNITY): Payer: Self-pay

## 2014-07-08 ENCOUNTER — Telehealth: Payer: Self-pay | Admitting: Family Medicine

## 2014-07-08 ENCOUNTER — Ambulatory Visit (HOSPITAL_COMMUNITY)
Admission: RE | Admit: 2014-07-08 | Discharge: 2014-07-08 | Disposition: A | Discharge status: Home / Self Care | Payer: BLUE CROSS/BLUE SHIELD | Source: Ambulatory Visit | Attending: Family Medicine | Admitting: Family Medicine

## 2014-07-08 DIAGNOSIS — R109 Unspecified abdominal pain: Secondary | ICD-10-CM | POA: Insufficient documentation

## 2014-07-08 DIAGNOSIS — K219 Gastro-esophageal reflux disease without esophagitis: Secondary | ICD-10-CM

## 2014-07-08 DIAGNOSIS — R1013 Epigastric pain: Secondary | ICD-10-CM

## 2014-07-08 DIAGNOSIS — K429 Umbilical hernia without obstruction or gangrene: Secondary | ICD-10-CM | POA: Diagnosis not present

## 2014-07-08 DIAGNOSIS — G8929 Other chronic pain: Secondary | ICD-10-CM

## 2014-07-08 DIAGNOSIS — J90 Pleural effusion, not elsewhere classified: Secondary | ICD-10-CM | POA: Diagnosis not present

## 2014-07-08 MED ORDER — IOHEXOL 300 MG/ML  SOLN
100.0000 mL | Freq: Once | INTRAMUSCULAR | Status: AC | PRN
  Administered 2014-07-08: 100 mL via INTRAVENOUS

## 2014-07-08 MED ORDER — IOHEXOL 300 MG/ML  SOLN
50.0000 mL | Freq: Once | INTRAMUSCULAR | Status: AC | PRN
  Administered 2014-07-08: 50 mL via ORAL

## 2014-07-08 NOTE — Telephone Encounter (Signed)
Called her and went over her CT results.  No evidence of diverticulosis/ itis.  She could have an ulcer causing her sx.  She would be ok with seeing a GI doc- I will set this up for her.  She will let me know if any change in the meantime.  Also counseled regarding tiny effusions in lungs- she will seek care if any pulmonary sx develop

## 2014-07-09 ENCOUNTER — Telehealth: Payer: Self-pay | Admitting: Family Medicine

## 2014-07-09 NOTE — Telephone Encounter (Signed)
Patient states that she has run out of her GLYCOLAX/MIRALAX. She states that she was instructed to double her dosage and now she has run out. [facility].  [phone]

## 2014-07-10 MED ORDER — POLYETHYLENE GLYCOL 3350 17 GM/SCOOP PO POWD: 17.0000 g | Freq: Two times a day (BID) | ORAL | Status: DC

## 2014-07-10 NOTE — Telephone Encounter (Signed)
It is otc - she can find whenever generic is on sale at different places and stock up on it for the best price.  In the meantime, i sent a new rx to her pharmacy

## 2014-07-11 ENCOUNTER — Encounter: Payer: Self-pay | Admitting: Gastroenterology

## 2014-07-18 ENCOUNTER — Ambulatory Visit: Payer: BLUE CROSS/BLUE SHIELD | Admitting: Physician Assistant

## 2014-07-21 ENCOUNTER — Encounter: Payer: Self-pay | Admitting: Family Medicine

## 2014-07-21 ENCOUNTER — Ambulatory Visit (INDEPENDENT_AMBULATORY_CARE_PROVIDER_SITE_OTHER): Payer: BLUE CROSS/BLUE SHIELD

## 2014-07-21 ENCOUNTER — Ambulatory Visit (INDEPENDENT_AMBULATORY_CARE_PROVIDER_SITE_OTHER): Payer: BLUE CROSS/BLUE SHIELD | Admitting: Family Medicine

## 2014-07-21 VITALS — BP 112/86 | HR 81 | Temp 98.0°F | Resp 16 | Ht 64.75 in | Wt 181.0 lb

## 2014-07-21 DIAGNOSIS — Z1239 Encounter for other screening for malignant neoplasm of breast: Secondary | ICD-10-CM

## 2014-07-21 DIAGNOSIS — Z13220 Encounter for screening for lipoid disorders: Secondary | ICD-10-CM | POA: Diagnosis not present

## 2014-07-21 DIAGNOSIS — R938 Abnormal findings on diagnostic imaging of other specified body structures: Secondary | ICD-10-CM

## 2014-07-21 DIAGNOSIS — Z8619 Personal history of other infectious and parasitic diseases: Secondary | ICD-10-CM | POA: Diagnosis not present

## 2014-07-21 DIAGNOSIS — N644 Mastodynia: Secondary | ICD-10-CM | POA: Diagnosis not present

## 2014-07-21 DIAGNOSIS — Z0000 Encounter for general adult medical examination without abnormal findings: Secondary | ICD-10-CM | POA: Diagnosis not present

## 2014-07-21 DIAGNOSIS — R9389 Abnormal findings on diagnostic imaging of other specified body structures: Secondary | ICD-10-CM

## 2014-07-21 LAB — POCT URINE PREGNANCY: Preg Test, Ur: NEGATIVE

## 2014-07-21 LAB — LIPID PANEL
CHOL/HDL RATIO: 1.7 ratio
Cholesterol: 168 mg/dL (ref 0–200)
HDL: 99 mg/dL (ref >=46)
LDL CALC: 57 mg/dL (ref 0–99)
Triglycerides: 60 mg/dL (ref <150)
VLDL: 12 mg/dL (ref 0–40)

## 2014-07-21 MED ORDER — VALACYCLOVIR HCL 1 G PO TABS: ORAL_TABLET | ORAL | Status: DC

## 2014-07-21 NOTE — Patient Instructions (Signed)
I will be in touch with the rest of your labs asap I have referred you for a "diagnostic mammogram-" this is done when you have any symptoms such as pain  Your chest x-ray looks just fine- I do not think the trace amount of fluid on your CT scan is of concern

## 2014-07-21 NOTE — Progress Notes (Addendum)
[facility] [address] [place] [address] [phone]  Date:  07/21/2014   Name:  Kellie Curtis   DOB:  [birth year]   MRN:  [id number]  PCP:  Delman Cheadle, MD    Chief Complaint: Annual Exam   History of Present Illness:  Kellie Curtis is a 44 y.o. very pleasant female patient who presents with the following:  Here today for a CPE, but she does not need a pap OBGYN care through Dr. Toney Rakes.  She was noted to have trace pleural effusions at her CT abd pelvis a couple of weeks ago- she would like to do a CXR for this to make sure all is ok She also notes pain in her breasts for a week,  Right more than left.  She also states that Dr. Brigitte Pulse had referred her for a mammogram but she is not sure of the date.  I do not see an appt for this in epic.  She is not sure of the date of her last mammogram and would like to get his done  She is fasting this am She had a period about 2 weeks ago and is s/p BTL.  She started OCP 3 months ago and is doing very well with them- her menses are much lighter and less painful.  She is quite happy with this. Her pelvic pain is much better  She also has noticed some cold sores recently and would like to try some medication for this if possible  Patient Active Problem List   Diagnosis Date Noted  . Perimenopause 06/06/2014  . Thickened endometrium 06/06/2014  . Low grade squamous intraepithelial lesion (LGSIL) on Papanicolaou smear of cervix 05/25/2014  . Hemoptysis 05/10/2013  . Acute bronchitis 01/11/2013  . Chest pain 01/11/2013  . Anemia, unspecified 10/04/2012  . Allergic rhinitis, cause unspecified 03/10/2012  . Irregular menses 05/30/2011  . Irritable bowel syndrome with constipation 05/30/2011  . Preventative health care 05/28/2011  . GERD 05/25/2010  . HIATAL HERNIA WITH REFLUX 05/21/2010  . COLONIC POLYPS, HX OF 05/21/2010  . CONSTIPATION 04/27/2010  . ANXIETY 03/14/2008  . DEPRESSION 03/14/2008  . ADHD 03/14/2008   . SEIZURE DISORDER 02/13/2008  . HYPERLIPIDEMIA 02/07/2008  . BACK PAIN 02/07/2008    Past Medical History  Diagnosis Date  . Learning disorder   . Seizure disorder   . Personal history of colonic polyps 05/12/2010    TUBULAR ADENOMA  . Abdominal pain, left upper quadrant   . Hiatal hernia   . Abdominal pain, left lower quadrant   . Unspecified constipation   . Abdominal pain, left upper quadrant   . Abdominal pain, epigastric   . Wheezing   . Chest pain, unspecified   . Fever, unspecified   . Attention deficit disorder with hyperactivity(314.01)   . Elevated blood pressure reading without diagnosis of hypertension   . Depressive disorder, not elsewhere classified   . Anxiety state, unspecified   . Other convulsions   . Backache, unspecified   . Chest pain, unspecified   . Acute sinusitis, unspecified   . Other and unspecified hyperlipidemia   . IBS (irritable bowel syndrome)   . Depression 10/05/2012  . Anemia     Past Surgical History  Procedure Laterality Date  . Cesarean section      X 2  . Tonsillectomy    . Tubal ligation      History  Substance Use Topics  . Smoking status: Never Smoker   . Smokeless  tobacco: Never Used  . Alcohol Use: 1.2 oz/week    2 Cans of beer per week     Comment: per week    Family History  Problem Relation Age of Onset  . Stroke Father   . Hypertension Father   . Gout Father   . Hyperlipidemia Father   . Colon cancer Father   . Alcohol abuse      grandfather  . Breast cancer      grandmother  . Hypertension      grandmother  . Asthma      grandmother  . Asthma Mother   . Breast cancer Maternal Grandmother     Allergies  Allergen Reactions  . Hydrocodone-Homatropine Itching    Medication list has been reviewed and updated.  Current Outpatient Prescriptions on File Prior to Visit  Medication Sig Dispense Refill  . albuterol (PROVENTIL HFA;VENTOLIN HFA) 108 (90 BASE) MCG/ACT inhaler Inhale 2 puffs into the lungs  every 6 (six) hours as needed for wheezing or shortness of breath. 1 Inhaler 2  . ferrous sulfate 325 (65 FE) MG tablet Take 1 tablet (325 mg total) by mouth daily with breakfast. 90 tablet 1  . levonorgestrel-ethinyl estradiol (AVIANE,ALESSE,LESSINA) 0.1-20 MG-MCG tablet Take 1 tablet by mouth daily. 1 Package 5  . meloxicam (MOBIC) 15 MG tablet Take 1 tablet (15 mg total) by mouth daily. On day 18-28 of menstrual cycle 30 tablet 4  . pantoprazole (PROTONIX) 40 MG tablet Take 1 tablet (40 mg total) by mouth daily. 30 min before breakfast 30 tablet 1  . polyethylene glycol powder (GLYCOLAX/MIRALAX) powder Take 17 g by mouth 2 (two) times daily. 500 g 11  . HYDROcodone-acetaminophen (NORCO) 5-325 MG per tablet Take 1 tablet by mouth every 6 (six) hours as needed. (Patient not taking: Reported on 07/21/2014) 30 tablet 0  . methylphenidate (DAYTRANA) 20 MG/9HR Place 1 patch onto the skin daily. wear patch for 9 hours only each day    . sucralfate (CARAFATE) 1 G tablet Take 1 tablet (1 g total) by mouth 4 (four) times daily -  with meals and at bedtime. 40 tablet 0   No current facility-administered medications on file prior to visit.    Review of Systems:  As per HPI- otherwise negative.   Physical Examination: Filed Vitals:   07/21/14 1007  BP: 112/86  Pulse: 81  Temp: 98 F (36.7 C)  Resp: 16   Filed Vitals:   07/21/14 1007  Height: 5' 4.75" (1.645 m)  Weight: 181 lb (82.101 kg)   Body mass index is 30.34 kg/(m^2). Ideal Body Weight: Weight in (lb) to have BMI = 25: 148.8  GEN: WDWN, NAD, Non-toxic, A & O x 3, overweight, looks well HEENT: Atraumatic, Normocephalic. Neck supple. No masses, No LAD. Bilateral TM wnl, oropharynx normal.  PEERL,EOMI.   Cold sores on her upper lip that appear to be healing  Ears and Nose: No external deformity. CV: RRR, No M/G/R. No JVD. No thrill. No extra heart sounds. PULM: CTA B, no wheezes, crackles, rhonchi. No retractions. No resp. distress.  No accessory muscle use. ABD: S, NT, ND, +BS. No rebound. No HSM. EXTR: No c/c/e NEURO Normal gait.  PSYCH: Normally interactive. Conversant. Not depressed or anxious appearing.  Calm demeanor.  Breast exam is normal to my exam- no masses, discharge or dimpling  Results for orders placed or performed in visit on 07/21/14  POCT urine pregnancy  Result Value Ref Range   Preg Test, Ur Negative  [facility] reading (PRIMARY) by  Dr. Lorelei Pont. CXR:  Negative  CHEST 2 VIEW  COMPARISON: 04/11/2014  FINDINGS: Heart size is normal. The lungs are clear. No focal consolidations. No pleural effusions. No pulmonary edema. Visualized osseous structures have a normal appearance.  IMPRESSION: No active cardiopulmonary disease.  Assessment and Plan: Physical exam  Screening for hyperlipidemia - Plan: Lipid panel  Abnormal CT scan - Plan: DG Chest 2 View  Screening for breast cancer  Breast pain - Plan: POCT urine pregnancy, MM Digital Diagnostic Bilat  H/O cold sores - Plan: valACYclovir (VALTREX) 1000 MG tablet  rx for valtrex to use as needed for cold sores Check lipids- otherwise her labs are UTD Referral for a diagnostic mammogram as she is having sx of pain Reassured that her CXR looks fine- suspect the fluid seen on her CT scan was trace amount of no concern  Also remind that she needs a tetanus shot with her labs   Signed Lamar Blinks, MD  4/25- Addnd: sent message to radiologist who read the CT- answer as below  Hi I was off last week, hence the delay. I reviewed case. Effusions were quite small and I wouldn't think need any further workup if patient is asymptomatic. Thanks

## 2014-07-23 ENCOUNTER — Telehealth: Payer: Self-pay

## 2014-07-24 ENCOUNTER — Other Ambulatory Visit: Payer: Self-pay | Admitting: Family Medicine

## 2014-07-24 NOTE — Telephone Encounter (Signed)
[facility] is needing the order for this patient mammongrm to give the postion of the mass please call roberta at [phone] because I believe it to be something else that needs to be changed that I am not sure of

## 2014-07-24 NOTE — Telephone Encounter (Signed)
[facility] needs other test added but I can do that. They want to know the position of her pain. 3, 6, 9 o'clock?

## 2014-07-25 NOTE — Telephone Encounter (Signed)
Left message on [facility].

## 2014-07-25 NOTE — Telephone Encounter (Signed)
Pt reports pain that moves around her breasts and does not have any specific location of pain

## 2014-07-28 ENCOUNTER — Other Ambulatory Visit: Payer: Self-pay | Admitting: Family Medicine

## 2014-07-28 DIAGNOSIS — N644 Mastodynia: Secondary | ICD-10-CM

## 2014-08-01 ENCOUNTER — Ambulatory Visit: Payer: BLUE CROSS/BLUE SHIELD | Admitting: Physician Assistant

## 2014-08-07 ENCOUNTER — Ambulatory Visit
Admission: RE | Admit: 2014-08-07 | Discharge: 2014-08-07 | Disposition: A | Discharge status: Home / Self Care | Payer: BLUE CROSS/BLUE SHIELD | Source: Ambulatory Visit | Attending: Family Medicine | Admitting: Family Medicine

## 2014-08-07 ENCOUNTER — Encounter (INDEPENDENT_AMBULATORY_CARE_PROVIDER_SITE_OTHER): Payer: Self-pay

## 2014-08-07 DIAGNOSIS — N644 Mastodynia: Secondary | ICD-10-CM

## 2014-09-10 ENCOUNTER — Telehealth: Payer: Self-pay

## 2014-09-10 NOTE — Telephone Encounter (Signed)
Pt needs to talk with dr copland about her cycle and changing her pills again because she is starting to get off track again and more pain    Best number [phone] ok to leave a message

## 2014-09-11 NOTE — Telephone Encounter (Signed)
Called pt, unable to leave message.

## 2014-09-15 NOTE — Telephone Encounter (Signed)
Mailbox is full. Will await pt to call back.

## 2014-10-08 ENCOUNTER — Ambulatory Visit (INDEPENDENT_AMBULATORY_CARE_PROVIDER_SITE_OTHER): Payer: BLUE CROSS/BLUE SHIELD | Admitting: Family Medicine

## 2014-10-08 VITALS — BP 124/82 | HR 73 | Temp 98.9°F | Resp 18 | Ht 64.0 in | Wt 180.6 lb

## 2014-10-08 DIAGNOSIS — D508 Other iron deficiency anemias: Secondary | ICD-10-CM

## 2014-10-08 DIAGNOSIS — R079 Chest pain, unspecified: Secondary | ICD-10-CM

## 2014-10-08 DIAGNOSIS — Z79899 Other long term (current) drug therapy: Secondary | ICD-10-CM | POA: Diagnosis not present

## 2014-10-08 DIAGNOSIS — N921 Excessive and frequent menstruation with irregular cycle: Secondary | ICD-10-CM | POA: Diagnosis not present

## 2014-10-08 MED ORDER — LORAZEPAM 0.5 MG PO TABS: 0.5000 mg | ORAL_TABLET | Freq: Two times a day (BID) | ORAL | Status: DC | PRN

## 2014-10-08 MED ORDER — MELOXICAM 15 MG PO TABS: 15.0000 mg | ORAL_TABLET | Freq: Every day | ORAL | Status: DC

## 2014-10-08 MED ORDER — PANTOPRAZOLE SODIUM 40 MG PO TBEC: 40.0000 mg | DELAYED_RELEASE_TABLET | Freq: Every day | ORAL | Status: DC

## 2014-10-08 NOTE — Progress Notes (Signed)
Subjective:    Patient ID: Kellie Curtis, female    DOB: 1970/06/26, 44 y.o.   MRN: [id number] Chief Complaint  Patient presents with  . Chest Pain    x2 days on and off. Achy pain. Maybe related to stress    HPI Has had intermittent chest pain for the past 2d.  Has had similar pain prior that has been worked up and told her that nothing was wrong with her heart.  States pain has been worse and has been more constant today.  Has not been relieved at all. The pain is a dull type.  No radiation, just uncomfortable.  Occ feels lightheaded but thinks it is probably that she is tired. No n/v, no diaphoresis, no f/c. She saw the pharmacist who told to take pepcid and that if it didn't go away in 15 minutes she should go to the doctor so that's what she did She was told that pain previous could have been from stress.  When she talks about the stressful things in her life, her pain gets worse. At times she feels like she can't catch her breath when she is feeling the chest pain.  She feels like she is wheezing.  This just started today while she was at work so has not tried her inhaler - also her insurance didn't cover it. She has not been able to sleep at all.  Pt has been trying to watch her menstrual cycle closely - she has been having some metromenorrhagia a which got worse yesterday.  She is on OCPs for birth controls.  She is not taking the meloxicam, carafate or pantoprazole.  Is taking the miralax twice a day.  Past Medical History  Diagnosis Date  . Learning disorder   . Seizure disorder   . Personal history of colonic polyps 05/12/2010    TUBULAR ADENOMA  . Abdominal pain, left upper quadrant   . Hiatal hernia   . Abdominal pain, left lower quadrant   . Unspecified constipation   . Abdominal pain, left upper quadrant   . Abdominal pain, epigastric   . Wheezing   . Chest pain, unspecified   . Fever, unspecified   . Attention deficit disorder with hyperactivity(314.01)   .  Elevated blood pressure reading without diagnosis of hypertension   . Depressive disorder, not elsewhere classified   . Anxiety state, unspecified   . Other convulsions   . Backache, unspecified   . Chest pain, unspecified   . Acute sinusitis, unspecified   . Other and unspecified hyperlipidemia   . IBS (irritable bowel syndrome)   . Depression 10/05/2012  . Anemia    Past Surgical History  Procedure Laterality Date  . Cesarean section      X 2  . Tonsillectomy    . Tubal ligation     Current Outpatient Prescriptions on File Prior to Visit  Medication Sig Dispense Refill  . albuterol (PROVENTIL HFA;VENTOLIN HFA) 108 (90 BASE) MCG/ACT inhaler Inhale 2 puffs into the lungs every 6 (six) hours as needed for wheezing or shortness of breath. 1 Inhaler 2  . ferrous sulfate 325 (65 FE) MG tablet Take 1 tablet (325 mg total) by mouth daily with breakfast. 90 tablet 1  . levonorgestrel-ethinyl estradiol (AVIANE,ALESSE,LESSINA) 0.1-20 MG-MCG tablet Take 1 tablet by mouth daily. 1 Package 5  . meloxicam (MOBIC) 15 MG tablet Take 1 tablet (15 mg total) by mouth daily. On day 18-28 of menstrual cycle 30 tablet 4  . methylphenidate (DAYTRANA) 20  MG/9HR Place 1 patch onto the skin daily. wear patch for 9 hours only each day    . pantoprazole (PROTONIX) 40 MG tablet Take 1 tablet (40 mg total) by mouth daily. 30 min before breakfast 30 tablet 1  . polyethylene glycol powder (GLYCOLAX/MIRALAX) powder Take 17 g by mouth 2 (two) times daily. 500 g 11  . sucralfate (CARAFATE) 1 G tablet Take 1 tablet (1 g total) by mouth 4 (four) times daily -  with meals and at bedtime. 40 tablet 0  . valACYclovir (VALTREX) 1000 MG tablet Use at the first sign of a cold sore. Take 2 pills, then take 2 pills 12 hours later 20 tablet 0   No current facility-administered medications on file prior to visit.   Allergies  Allergen Reactions  . Hydrocodone-Homatropine Itching   Family History  Problem Relation Age of Onset   . Stroke Father   . Hypertension Father   . Gout Father   . Hyperlipidemia Father   . Colon cancer Father   . Alcohol abuse      grandfather  . Breast cancer      grandmother  . Hypertension      grandmother  . Asthma      grandmother  . Asthma Mother   . Breast cancer Maternal Grandmother    History   Social History  . Marital Status: Single    Spouse Name: N/A  . Number of Children: 3  . Years of Education: N/A   Occupational History  .  [place] History Main Topics  . Smoking status: Never Smoker   . Smokeless tobacco: Never Used  . Alcohol Use: 1.2 oz/week    2 Cans of beer per week     Comment: per week  . Drug Use: No  . Sexual Activity: Yes    Birth Control/ Protection: Abstinence     Comment: 1ST INTERCOURSE- 15, PARTNERS- GREATER THAN 5   Other Topics Concern  . None   Social History Narrative     Review of Systems  Constitutional: Positive for fatigue. Negative for fever, chills, diaphoresis, activity change, appetite change and unexpected weight change.  Respiratory: Positive for chest tightness, shortness of breath and wheezing. Negative for apnea and cough.   Cardiovascular: Positive for chest pain. Negative for palpitations and leg swelling.  Gastrointestinal: Positive for abdominal pain and constipation. Negative for nausea and vomiting.  Genitourinary: Positive for vaginal bleeding and menstrual problem.  Musculoskeletal: Negative for myalgias, back pain, joint swelling, arthralgias and gait problem.  Skin: Negative for color change and rash.  Neurological: Positive for light-headedness. Negative for dizziness.  Psychiatric/Behavioral: Positive for sleep disturbance and decreased concentration. The patient is nervous/anxious.        Objective:  BP 124/82 mmHg  Pulse 73  Temp(Src) 98.9 F (37.2 C) (Oral)  Resp 18  Ht 5\' 4"  (1.626 m)  Wt 180 lb 9.6 oz (81.92 kg)  BMI 30.98 kg/m2  SpO2 99%  LMP 10/06/2014  Physical Exam    Constitutional: She is oriented to person, place, and time. She appears well-developed and well-nourished. No distress.  HENT:  Head: Normocephalic and atraumatic.  Right Ear: External ear normal.  Left Ear: External ear normal.  Eyes: Conjunctivae are normal. No scleral icterus.  Neck: Normal range of motion. Neck supple. No thyromegaly present.  Cardiovascular: Normal rate, regular rhythm, normal heart sounds and intact distal pulses.   Pulmonary/Chest: Effort normal and breath sounds normal. No respiratory distress.  She exhibits tenderness and bony tenderness. She exhibits no edema, no swelling and no retraction.  Musculoskeletal: She exhibits no edema.  Lymphadenopathy:    She has no cervical adenopathy.  Neurological: She is alert and oriented to person, place, and time.  Skin: Skin is warm and dry. She is not diaphoretic. No erythema.  Psychiatric: Her behavior is normal. Her mood appears anxious.     EKG: NSR, no ischemic changes - unchanged since prior EKG 04/11/2014 Peak flow 350 - goal 470    Assessment & Plan:   1. Chest pain, unspecified chest pain type - PE r/o w/ nml EKG and neg d-dimer in low probability pt - only risk factor is OCPs.  2. Metrorrhagia   3. Menorrhagia with irregular cycle - restart iron qd  4. Polypharmacy   5. Other iron deficiency anemias   6.      Anxiety/stress/insomnia - all seem likely cause of exacerbating her current CP sxs. Has been placed on ppis temporarily prior, never needed any bzd - try ativan qhs to help sleep - hopefully prob will resolve but if it helps but sxs continue, rec RTC so we can start pt on ssri intead. 7.   Reactive airway disease - peak flow slightly reduced though exam normal and sxs non-specific - can try prn albuterol and if it helps and sxs persist, RTC.  Orders Placed This Encounter  Procedures  . CBC with Differential  . Ferritin  . D-dimer, quantitative (not at [facility])  . EKG 12-Lead    Meds ordered this  encounter  Medications  . pantoprazole (PROTONIX) 40 MG tablet    Sig: Take 1 tablet (40 mg total) by mouth daily. 30 min before breakfast    Dispense:  30 tablet    Refill:  1  . meloxicam (MOBIC) 15 MG tablet    Sig: Take 1 tablet (15 mg total) by mouth daily. On day 18-28 of menstrual cycle    Dispense:  30 tablet    Refill:  4  . LORazepam (ATIVAN) 0.5 MG tablet    Sig: Take 1 tablet (0.5 mg total) by mouth 2 (two) times daily as needed for anxiety or sleep.    Dispense:  30 tablet    Refill:  0    Over 40 min spent in face-to-face evaluation of and consultation with patient and coordination of care.  Over 50% of this time was spent counseling this patient.  Delman Cheadle, MD MPH

## 2014-10-08 NOTE — Patient Instructions (Signed)
Start taking 2 tabs of the pepcid you bought every night before bed.  Take a protonix every morning 30 minutes before breakfast.  Start a trial of the lorazepam - it should help you sleep and if stress is making it worse it will help you relax.  If this does help, then come back because we have our answer but not a solution - would need to start you on a different type of maintenance med. Make sure you are taking the iron supplement once a day - I recommend ferrous sulfate 368m (67mof elemental iron).  Take this on an empty stomach 30 minutes before eating or 2 hours after a meal.  Take it with a vitamin C supplement or a small glass of orange juice.  If you still feel short of breath or wheezing - try your inhaler.  Chest Pain (Nonspecific) It is often hard to give a specific diagnosis for the cause of chest pain. There is always a chance that your pain could be related to something serious, such as a heart attack or a blood clot in the lungs. You need to follow up with your health care provider for further evaluation. CAUSES   Heartburn.  Pneumonia or bronchitis.  Anxiety or stress.  Inflammation around your heart (pericarditis) or lung (pleuritis or pleurisy).  A blood clot in the lung.  A collapsed lung (pneumothorax). It can develop suddenly on its own (spontaneous pneumothorax) or from trauma to the chest.  Shingles infection (herpes zoster virus). The chest wall is composed of bones, muscles, and cartilage. Any of these can be the source of the pain.  The bones can be bruised by injury.  The muscles or cartilage can be strained by coughing or overwork.  The cartilage can be affected by inflammation and become sore (costochondritis). DIAGNOSIS  Lab tests or other studies may be needed to find the cause of your pain. Your health care provider may have you take a test called an ambulatory electrocardiogram (ECG). An ECG records your heartbeat patterns over a 24-hour period. You  may also have other tests, such as:  Transthoracic echocardiogram (TTE). During echocardiography, sound waves are used to evaluate how blood flows through your heart.  Transesophageal echocardiogram (TEE).  Cardiac monitoring. This allows your health care provider to monitor your heart rate and rhythm in real time.  Holter monitor. This is a portable device that records your heartbeat and can help diagnose heart arrhythmias. It allows your health care provider to track your heart activity for several days, if needed.  Stress tests by exercise or by giving medicine that makes the heart beat faster. TREATMENT   Treatment depends on what may be causing your chest pain. Treatment may include:  Acid blockers for heartburn.  Anti-inflammatory medicine.  Pain medicine for inflammatory conditions.  Antibiotics if an infection is present.  You may be advised to change lifestyle habits. This includes stopping smoking and avoiding alcohol, caffeine, and chocolate.  You may be advised to keep your head raised (elevated) when sleeping. This reduces the chance of acid going backward from your stomach into your esophagus. Most of the time, nonspecific chest pain will improve within 2-3 days with rest and mild pain medicine.  HOME CARE INSTRUCTIONS   If antibiotics were prescribed, take them as directed. Finish them even if you start to feel better.  For the next few days, avoid physical activities that bring on chest pain. Continue physical activities as directed.  Do not use any  tobacco products, including cigarettes, chewing tobacco, or electronic cigarettes.  Avoid drinking alcohol.  Only take medicine as directed by your health care provider.  Follow your health care provider's suggestions for further testing if your chest pain does not go away.  Keep any follow-up appointments you made. If you do not go to an appointment, you could develop lasting (chronic) problems with pain. If there  is any problem keeping an appointment, call to reschedule. SEEK MEDICAL CARE IF:   Your chest pain does not go away, even after treatment.  You have a rash with blisters on your chest.  You have a fever. SEEK IMMEDIATE MEDICAL CARE IF:   You have increased chest pain or pain that spreads to your arm, neck, jaw, back, or abdomen.  You have shortness of breath.  You have an increasing cough, or you cough up blood.  You have severe back or abdominal pain.  You feel nauseous or vomit.  You have severe weakness.  You faint.  You have chills. This is an emergency. Do not wait to see if the pain will go away. Get medical help at once. Call your local emergency services (911 in [place] Do not drive yourself to the hospital. MAKE SURE YOU:   Understand these instructions.  Will watch your condition.  Will get help right away if you are not doing well or get worse. Document Released: 12/29/2004 Document Revised: 03/26/2013 Document Reviewed: 10/25/2007 [facility] Patient Information 2015 [place], [place]. This information is not intended to replace advice given to you by your health care provider. Make sure you discuss any questions you have with your health care provider.  Food Choices for Gastroesophageal Reflux Disease When you have gastroesophageal reflux disease (GERD), the foods you eat and your eating habits are very important. Choosing the right foods can help ease the discomfort of GERD. WHAT GENERAL GUIDELINES DO I NEED TO FOLLOW?  Choose fruits, vegetables, whole grains, low-fat dairy products, and low-fat meat, fish, and poultry.  Limit fats such as oils, salad dressings, butter, nuts, and avocado.  Keep a food diary to identify foods that cause symptoms.  Avoid foods that cause reflux. These may be different for different people.  Eat frequent small meals instead of three large meals each day.  Eat your meals slowly, in a relaxed setting.  Limit fried foods.  Cook  foods using methods other than frying.  Avoid drinking alcohol.  Avoid drinking large amounts of liquids with your meals.  Avoid bending over or lying down until 2-3 hours after eating. WHAT FOODS ARE NOT RECOMMENDED? The following are some foods and drinks that may worsen your symptoms: Vegetables Tomatoes. Tomato juice. Tomato and spaghetti sauce. Chili peppers. Onion and garlic. Horseradish. Fruits Oranges, grapefruit, and lemon (fruit and juice). Meats High-fat meats, fish, and poultry. This includes hot dogs, ribs, ham, sausage, salami, and bacon. Dairy Whole milk and chocolate milk. Sour cream. Cream. Butter. Ice cream. Cream cheese.  Beverages Coffee and tea, with or without caffeine. Carbonated beverages or energy drinks. Condiments Hot sauce. Barbecue sauce.  Sweets/Desserts Chocolate and cocoa. Donuts. Peppermint and spearmint. Fats and Oils High-fat foods, including [place] fries and potato chips. Other Vinegar. Strong spices, such as black pepper, white pepper, red pepper, cayenne, curry powder, cloves, ginger, and chili powder. The items listed above may not be a complete list of foods and beverages to avoid. Contact your dietitian for more information. Document Released: 03/21/2005 Document Revised: 03/26/2013 Document Reviewed: 01/23/2013 [employer] Patient Information 2015  [employer]. This information is not intended to replace advice given to you by your health care provider. Make sure you discuss any questions you have with your health care provider. Stress and Stress Management Stress is a normal reaction to life events. It is what you feel when life demands more than you are used to or more than you can handle. Some stress can be useful. For example, the stress reaction can help you catch the last bus of the day, study for a test, or meet a deadline at work. But stress that occurs too often or for too long can cause problems. It can affect your emotional health  and interfere with relationships and normal daily activities. Too much stress can weaken your immune system and increase your risk for physical illness. If you already have a medical problem, stress can make it worse. CAUSES  All sorts of life events may cause stress. An event that causes stress for one person may not be stressful for another person. Major life events commonly cause stress. These may be positive or negative. Examples include losing your job, moving into a new home, getting married, having a baby, or losing a loved one. Less obvious life events may also cause stress, especially if they occur day after day or in combination. Examples include working long hours, driving in traffic, caring for children, being in debt, or being in a difficult relationship. SIGNS AND SYMPTOMS Stress may cause emotional symptoms including, the following:  Anxiety. This is feeling worried, afraid, on edge, overwhelmed, or out of control.  Anger. This is feeling irritated or impatient.  Depression. This is feeling sad, down, helpless, or guilty.  Difficulty focusing, remembering, or making decisions. Stress may cause physical symptoms, including the following:   Aches and pains. These may affect your head, neck, back, stomach, or other areas of your body.  Tight muscles or clenched jaw.  Low energy or trouble sleeping. Stress may cause unhealthy behaviors, including the following:   Eating to feel better (overeating) or skipping meals.  Sleeping too little, too much, or both.  Working too much or putting off tasks (procrastination).  Smoking, drinking alcohol, or using drugs to feel better. DIAGNOSIS  Stress is diagnosed through an assessment by your health care provider. Your health care provider will ask questions about your symptoms and any stressful life events.Your health care provider will also ask about your medical history and may order blood tests or other tests. Certain medical  conditions and medicine can cause physical symptoms similar to stress. Mental illness can cause emotional symptoms and unhealthy behaviors similar to stress. Your health care provider may refer you to a mental health professional for further evaluation.  TREATMENT  Stress management is the recommended treatment for stress.The goals of stress management are reducing stressful life events and coping with stress in healthy ways.  Techniques for reducing stressful life events include the following:  Stress identification. Self-monitor for stress and identify what causes stress for you. These skills may help you to avoid some stressful events.  Time management. Set your priorities, keep a calendar of events, and learn to say "no." These tools can help you avoid making too many commitments. Techniques for coping with stress include the following:  Rethinking the problem. Try to think realistically about stressful events rather than ignoring them or overreacting. Try to find the positives in a stressful situation rather than focusing on the negatives.  Exercise. Physical exercise can release both physical and emotional tension.  The key is to find a form of exercise you enjoy and do it regularly.  Relaxation techniques. These relax the body and mind. Examples include yoga, meditation, tai chi, biofeedback, deep breathing, progressive muscle relaxation, listening to music, being out in nature, journaling, and other hobbies. Again, the key is to find one or more that you enjoy and can do regularly.  Healthy lifestyle. Eat a balanced diet, get plenty of sleep, and do not smoke. Avoid using alcohol or drugs to relax.  Strong support network. Spend time with family, friends, or other people you enjoy being around.Express your feelings and talk things over with someone you trust. Counseling or talktherapy with a mental health professional may be helpful if you are having difficulty managing stress on your  own. Medicine is typically not recommended for the treatment of stress.Talk to your health care provider if you think you need medicine for symptoms of stress. HOME CARE INSTRUCTIONS  Keep all follow-up visits as directed by your health care provider.  Take all medicines as directed by your health care provider. SEEK MEDICAL CARE IF:  Your symptoms get worse or you start having new symptoms.  You feel overwhelmed by your problems and can no longer manage them on your own. SEEK IMMEDIATE MEDICAL CARE IF:  You feel like hurting yourself or someone else. Document Released: 09/14/2000 Document Revised: 08/05/2013 Document Reviewed: 11/13/2012 [facility] Patient Information 2015 [place], [place]. This information is not intended to replace advice given to you by your health care provider. Make sure you discuss any questions you have with your health care provider.

## 2014-10-09 LAB — CBC WITH DIFFERENTIAL/PLATELET
Basophils Absolute: 0 10*3/uL (ref 0.0–0.1)
Basophils Relative: 1 % (ref 0–1)
EOS ABS: 0.1 10*3/uL (ref 0.0–0.7)
Eosinophils Relative: 2 % (ref 0–5)
HEMATOCRIT: 34.1 % — AB (ref 36.0–46.0)
Hemoglobin: 11.4 g/dL — ABNORMAL LOW (ref 12.0–15.0)
Lymphocytes Relative: 45 % (ref 12–46)
Lymphs Abs: 1.9 10*3/uL (ref 0.7–4.0)
MCH: 29.9 pg (ref 26.0–34.0)
MCHC: 33.4 g/dL (ref 30.0–36.0)
MCV: 89.5 fL (ref 78.0–100.0)
MONO ABS: 0.3 10*3/uL (ref 0.1–1.0)
MONOS PCT: 7 % (ref 3–12)
MPV: 10.2 fL (ref 8.6–12.4)
NEUTROS ABS: 1.9 10*3/uL (ref 1.7–7.7)
Neutrophils Relative %: 45 % (ref 43–77)
Platelets: 350 10*3/uL (ref 150–400)
RBC: 3.81 MIL/uL — AB (ref 3.87–5.11)
RDW: 13.9 % (ref 11.5–15.5)
WBC: 4.3 10*3/uL (ref 4.0–10.5)

## 2014-10-09 LAB — D-DIMER, QUANTITATIVE (NOT AT ARMC): D DIMER QUANT: 0.45 ug{FEU}/mL (ref 0.00–0.48)

## 2014-10-09 LAB — FERRITIN: FERRITIN: 24 ng/mL (ref 10–291)

## 2014-10-10 ENCOUNTER — Telehealth: Payer: Self-pay | Admitting: *Deleted

## 2014-10-10 NOTE — Telephone Encounter (Signed)
Pt called wanting lab results.  Results were given.  Pt was also made aware that she had an Rx of Mobic at [facility] on [address]; which the pt did not know.  She mention she was still in pain with taking the Ativan.  Pt was advised Ativan was for anxiety and sleep.  Pt understood.

## 2014-10-11 ENCOUNTER — Encounter: Payer: Self-pay | Admitting: Family Medicine

## 2014-10-17 ENCOUNTER — Other Ambulatory Visit: Payer: Self-pay | Admitting: Family Medicine

## 2014-10-21 ENCOUNTER — Encounter: Payer: Self-pay | Admitting: Family Medicine

## 2015-02-10 ENCOUNTER — Emergency Department (HOSPITAL_COMMUNITY)
Admission: EM | Admit: 2015-02-10 | Discharge: 2015-02-10 | Disposition: A | Discharge status: Home / Self Care | Payer: BLUE CROSS/BLUE SHIELD | Attending: Emergency Medicine | Admitting: Emergency Medicine

## 2015-02-10 ENCOUNTER — Encounter (HOSPITAL_COMMUNITY): Payer: Self-pay | Admitting: Emergency Medicine

## 2015-02-10 DIAGNOSIS — Z8639 Personal history of other endocrine, nutritional and metabolic disease: Secondary | ICD-10-CM | POA: Diagnosis not present

## 2015-02-10 DIAGNOSIS — F419 Anxiety disorder, unspecified: Secondary | ICD-10-CM | POA: Insufficient documentation

## 2015-02-10 DIAGNOSIS — Z8669 Personal history of other diseases of the nervous system and sense organs: Secondary | ICD-10-CM | POA: Diagnosis not present

## 2015-02-10 DIAGNOSIS — D649 Anemia, unspecified: Secondary | ICD-10-CM | POA: Insufficient documentation

## 2015-02-10 DIAGNOSIS — Z79899 Other long term (current) drug therapy: Secondary | ICD-10-CM | POA: Insufficient documentation

## 2015-02-10 DIAGNOSIS — R1032 Left lower quadrant pain: Secondary | ICD-10-CM

## 2015-02-10 DIAGNOSIS — Z8719 Personal history of other diseases of the digestive system: Secondary | ICD-10-CM | POA: Insufficient documentation

## 2015-02-10 DIAGNOSIS — Z86010 Personal history of colonic polyps: Secondary | ICD-10-CM | POA: Diagnosis not present

## 2015-02-10 DIAGNOSIS — B9689 Other specified bacterial agents as the cause of diseases classified elsewhere: Secondary | ICD-10-CM

## 2015-02-10 DIAGNOSIS — N760 Acute vaginitis: Secondary | ICD-10-CM | POA: Diagnosis not present

## 2015-02-10 DIAGNOSIS — Z8709 Personal history of other diseases of the respiratory system: Secondary | ICD-10-CM | POA: Insufficient documentation

## 2015-02-10 DIAGNOSIS — F909 Attention-deficit hyperactivity disorder, unspecified type: Secondary | ICD-10-CM | POA: Insufficient documentation

## 2015-02-10 DIAGNOSIS — Z8739 Personal history of other diseases of the musculoskeletal system and connective tissue: Secondary | ICD-10-CM | POA: Insufficient documentation

## 2015-02-10 HISTORY — DX: Dyslexia and alexia: R48.0

## 2015-02-10 LAB — BASIC METABOLIC PANEL
ANION GAP: 7 (ref 5–15)
BUN: 14 mg/dL (ref 6–20)
CALCIUM: 9.6 mg/dL (ref 8.9–10.3)
CHLORIDE: 104 mmol/L (ref 101–111)
CO2: 29 mmol/L (ref 22–32)
Creatinine, Ser: 0.84 mg/dL (ref 0.44–1.00)
GFR calc non Af Amer: >60 mL/min (ref >60)
Glucose, Bld: 95 mg/dL (ref 65–99)
POTASSIUM: 4.5 mmol/L (ref 3.5–5.1)
Sodium: 140 mmol/L (ref 135–145)

## 2015-02-10 LAB — CBC WITH DIFFERENTIAL/PLATELET
BASOS ABS: 0 10*3/uL (ref 0.0–0.1)
Basophils Relative: 0 %
Eosinophils Absolute: 0 10*3/uL (ref 0.0–0.7)
Eosinophils Relative: 1 %
HEMATOCRIT: 38.9 % (ref 36.0–46.0)
HEMOGLOBIN: 12.9 g/dL (ref 12.0–15.0)
Lymphocytes Relative: 40 %
Lymphs Abs: 2 10*3/uL (ref 0.7–4.0)
MCH: 30.6 pg (ref 26.0–34.0)
MCHC: 33.2 g/dL (ref 30.0–36.0)
MCV: 92.4 fL (ref 78.0–100.0)
Monocytes Absolute: 0.3 10*3/uL (ref 0.1–1.0)
Monocytes Relative: 6 %
NEUTROS ABS: 2.7 10*3/uL (ref 1.7–7.7)
NEUTROS PCT: 53 %
Platelets: 359 10*3/uL (ref 150–400)
RBC: 4.21 MIL/uL (ref 3.87–5.11)
RDW: 12.9 % (ref 11.5–15.5)
WBC: 5.1 10*3/uL (ref 4.0–10.5)

## 2015-02-10 LAB — URINE MICROSCOPIC-ADD ON

## 2015-02-10 LAB — URINALYSIS, ROUTINE W REFLEX MICROSCOPIC
BILIRUBIN URINE: NEGATIVE
GLUCOSE, UA: NEGATIVE mg/dL
KETONES UR: NEGATIVE mg/dL
LEUKOCYTES UA: NEGATIVE
NITRITE: NEGATIVE
Protein, ur: NEGATIVE mg/dL
Specific Gravity, Urine: 1.026 (ref 1.005–1.030)
Urobilinogen, UA: 1 mg/dL (ref 0.0–1.0)
pH: 6.5 (ref 5.0–8.0)

## 2015-02-10 LAB — WET PREP, GENITAL
Trich, Wet Prep: NONE SEEN
Yeast Wet Prep HPF POC: NONE SEEN

## 2015-02-10 LAB — POC URINE PREG, ED: Preg Test, Ur: NEGATIVE

## 2015-02-10 MED ORDER — ACETAMINOPHEN 500 MG PO TABS
1000.0000 mg | ORAL_TABLET | Freq: Once | ORAL | Status: AC
  Administered 2015-02-10: 1000 mg via ORAL
  Filled 2015-02-10: qty 2

## 2015-02-10 MED ORDER — CEFTRIAXONE SODIUM 250 MG IJ SOLR
250.0000 mg | Freq: Once | INTRAMUSCULAR | Status: AC
  Administered 2015-02-10: 250 mg via INTRAMUSCULAR
  Filled 2015-02-10: qty 250

## 2015-02-10 MED ORDER — AZITHROMYCIN 250 MG PO TABS
1000.0000 mg | ORAL_TABLET | Freq: Once | ORAL | Status: AC
  Administered 2015-02-10: 1000 mg via ORAL
  Filled 2015-02-10: qty 4

## 2015-02-10 MED ORDER — KETOROLAC TROMETHAMINE 60 MG/2ML IM SOLN
60.0000 mg | Freq: Once | INTRAMUSCULAR | Status: AC
  Administered 2015-02-10: 60 mg via INTRAMUSCULAR
  Filled 2015-02-10: qty 2

## 2015-02-10 MED ORDER — METRONIDAZOLE 500 MG PO TABS: 500.0000 mg | ORAL_TABLET | Freq: Two times a day (BID) | ORAL | Status: DC

## 2015-02-10 NOTE — ED Provider Notes (Signed)
CSN: [id number]     Arrival date & time 02/10/15  0175 History   First MD Initiated Contact with Patient 02/10/15 [phone]     Chief Complaint  Patient presents with  . Abdominal Pain    lower     (Consider location/radiation/quality/duration/timing/severity/associated sxs/prior Treatment) Patient is a 44 y.o. female presenting with abdominal pain. The history is provided by the patient.  Abdominal Pain Pain location:  LLQ Pain quality: cramping, sharp and shooting   Pain radiates to:  Does not radiate Pain severity:  Moderate Onset quality:  Gradual Duration:  4 days Timing:  Constant Progression:  Unchanged Chronicity:  New Relieved by:  Nothing Worsened by:  Nothing tried Ineffective treatments:  None tried Associated symptoms: no chest pain, no chills, no dysuria, no fever, no nausea, no shortness of breath and no vomiting    44 yo F with a chief complaint of left lower quadrant abdominal pain. This been going on for at least a week but worsening yesterday. Patient has had pain like this similarly that was related to "her female parts". Patient used to see a GYN but as lost her insurance and so has not seen anyone for this in some time. Felt that the pain started with the onset of her menses. Denies significant bleeding currently denies vaginal discharge. Denies constipation denies worsening with bowel movement. Pain is crampy coming and going. Has a history of irritable bowel syndrome, but feels like this is different in character. Denies fevers or chills denies vomiting denies nausea denies diarrhea. Last BM 3 days ago  Past Medical History  Diagnosis Date  . Learning disorder   . Seizure disorder ([place])   . Personal history of colonic polyps 05/12/2010    TUBULAR ADENOMA  . Abdominal pain, left upper quadrant   . Hiatal hernia   . Abdominal pain, left lower quadrant   . Unspecified constipation   . Abdominal pain, left upper quadrant   . Abdominal pain, epigastric   .  Wheezing   . Chest pain, unspecified   . Fever, unspecified   . Attention deficit disorder with hyperactivity(314.01)   . Elevated blood pressure reading without diagnosis of hypertension   . Depressive disorder, not elsewhere classified   . Anxiety state, unspecified   . Other convulsions   . Backache, unspecified   . Chest pain, unspecified   . Acute sinusitis, unspecified   . Other and unspecified hyperlipidemia   . IBS (irritable bowel syndrome)   . Depression 10/05/2012  . Anemia   . Dyslexia    Past Surgical History  Procedure Laterality Date  . Cesarean section      X 2  . Tonsillectomy    . Tubal ligation     Family History  Problem Relation Age of Onset  . Stroke Father   . Hypertension Father   . Gout Father   . Hyperlipidemia Father   . Colon cancer Father   . Alcohol abuse      grandfather  . Breast cancer      grandmother  . Hypertension      grandmother  . Asthma      grandmother  . Asthma Mother   . Breast cancer Maternal Grandmother    Social History  Substance Use Topics  . Smoking status: Never Smoker   . Smokeless tobacco: Never Used  . Alcohol Use: 1.2 oz/week    2 Cans of beer per week     Comment: per week   OB  History    Gravida Para Term Preterm AB TAB SAB Ectopic Multiple Living   3 3        3      Review of Systems  Constitutional: Negative for fever and chills.  HENT: Negative for congestion and rhinorrhea.   Eyes: Negative for redness and visual disturbance.  Respiratory: Negative for shortness of breath and wheezing.   Cardiovascular: Negative for chest pain and palpitations.  Gastrointestinal: Positive for abdominal pain. Negative for nausea and vomiting.  Genitourinary: Negative for dysuria and urgency.  Musculoskeletal: Negative for myalgias and arthralgias.  Skin: Negative for pallor and wound.  Neurological: Negative for dizziness and headaches.      Allergies  Hydrocodone-homatropine  Home Medications   Prior  to Admission medications   Medication Sig Start Date End Date Taking? Authorizing Provider  methylphenidate (METADATE CD) 40 MG CR capsule Take 40 mg by mouth daily.   Yes Historical Provider, MD  valACYclovir (VALTREX) 1000 MG tablet Use at the first sign of a cold sore. Take 2 pills, then take 2 pills 12 hours later 07/21/14  Yes Gay Filler Copland, MD  albuterol (PROVENTIL HFA;VENTOLIN HFA) 108 (90 BASE) MCG/ACT inhaler Inhale 2 puffs into the lungs every 6 (six) hours as needed for wheezing or shortness of breath. Patient not taking: Reported on 02/10/2015 04/11/14   Darreld Mclean, MD  ferrous sulfate 325 (65 FE) MG tablet Take 1 tablet (325 mg total) by mouth daily with breakfast. Patient not taking: Reported on 02/10/2015 06/02/14   Shawnee Knapp, MD  LORazepam (ATIVAN) 0.5 MG tablet Take 1 tablet (0.5 mg total) by mouth 2 (two) times daily as needed for anxiety or sleep. Patient not taking: Reported on 02/10/2015 10/08/14   Shawnee Knapp, MD  meloxicam (MOBIC) 15 MG tablet Take 1 tablet (15 mg total) by mouth daily. On day 18-28 of menstrual cycle Patient not taking: Reported on 02/10/2015 10/08/14   Shawnee Knapp, MD  metroNIDAZOLE (FLAGYL) 500 MG tablet Take 1 tablet (500 mg total) by mouth 2 (two) times daily. 02/10/15   Deno Etienne, DO  ORSYTHIA 0.1-20 MG-MCG tablet TAKE 1 TABLET BY MOUTH DAILY. Patient not taking: Reported on 02/10/2015 10/17/14   Shawnee Knapp, MD  pantoprazole (PROTONIX) 40 MG tablet Take 1 tablet (40 mg total) by mouth daily. 30 min before breakfast Patient not taking: Reported on 02/10/2015 10/08/14   Shawnee Knapp, MD  polyethylene glycol powder (GLYCOLAX/MIRALAX) powder Take 17 g by mouth 2 (two) times daily. Patient not taking: Reported on 02/10/2015 07/10/14   Shawnee Knapp, MD  sucralfate (CARAFATE) 1 G tablet Take 1 tablet (1 g total) by mouth 4 (four) times daily -  with meals and at bedtime. Patient not taking: Reported on 02/10/2015 07/04/14   Gay Filler Copland, MD   BP 123/88 mmHg  Pulse 73   Temp(Src) 97.9 F (36.6 C) (Oral)  Resp 16  Ht 5\' 4"  (1.626 m)  Wt 170 lb (77.111 kg)  BMI 29.17 kg/m2  SpO2 100%  LMP 02/05/2015 (Approximate) Physical Exam  Constitutional: She is oriented to person, place, and time. She appears well-developed and well-nourished. No distress.  HENT:  Head: Normocephalic and atraumatic.  Eyes: EOM are normal. Pupils are equal, round, and reactive to light.  Neck: Normal range of motion. Neck supple.  Cardiovascular: Normal rate and regular rhythm.  Exam reveals no gallop and no friction rub.   No murmur heard. Pulmonary/Chest: Effort normal. She has no  wheezes. She has no rales.  Abdominal: Soft. She exhibits no distension. There is tenderness (worst LLQ). There is no rebound and no guarding.  Genitourinary: Uterus is deviated. Cervix exhibits motion tenderness and discharge. Right adnexum displays no mass, no tenderness and no fullness. Left adnexum displays no mass, no tenderness and no fullness.  Musculoskeletal: She exhibits no edema or tenderness.  Neurological: She is alert and oriented to person, place, and time.  Skin: Skin is warm and dry. She is not diaphoretic.  Psychiatric: She has a normal mood and affect. Her behavior is normal.  Nursing note and vitals reviewed.   ED Course  Procedures (including critical care time) Labs Review Labs Reviewed  WET PREP, GENITAL - Abnormal; Notable for the following:    Clue Cells Wet Prep HPF POC FEW (*)    WBC, Wet Prep HPF POC FEW (*)    All other components within normal limits  URINALYSIS, ROUTINE W REFLEX MICROSCOPIC (NOT AT [facility]) - Abnormal; Notable for the following:    APPearance CLOUDY (*)    Hgb urine dipstick MODERATE (*)    All other components within normal limits  URINE MICROSCOPIC-ADD ON - Abnormal; Notable for the following:    Squamous Epithelial / LPF MANY (*)    All other components within normal limits  CBC WITH DIFFERENTIAL/PLATELET  BASIC METABOLIC PANEL  POC URINE  PREG, ED  GC/CHLAMYDIA PROBE AMP ([place]) NOT AT [facility]    Imaging Review No results found. I have personally reviewed and evaluated these images and lab results as part of my medical decision-making.   EKG Interpretation None      MDM   Final diagnoses:  Left lower quadrant pain  BV (bacterial vaginosis)    44 yo F with a chief complaint of left lower quadrant abdominal pain. Pain is low in nature, cervical motion tenderness on pelvic exam. Concern for PID. No noted adnexal tenderness, feel imaging unhelpful at this time.  Basic labs unremarkable. Will treat as PID.  Also with BV on wet prep. [facility]  2:44 PM:  I have discussed the diagnosis/risks/treatment options with the patient and believe the pt to be eligible for discharge home to follow-up with GYN. We also discussed returning to the ED immediately if new or worsening sx occur. We discussed the sx which are most concerning (e.g., sudden worsening pain, fever, inability to tolerate by mouth) that necessitate immediate return. Medications administered to the patient during their visit and any new prescriptions provided to the patient are listed below.  Medications given during this visit Medications  ketorolac (TORADOL) injection 60 mg (60 mg Intramuscular Given 02/10/15 1059)  acetaminophen (TYLENOL) tablet 1,000 mg (1,000 mg Oral Given 02/10/15 1058)  cefTRIAXone (ROCEPHIN) injection 250 mg (250 mg Intramuscular Given 02/10/15 1229)  azithromycin (ZITHROMAX) tablet 1,000 mg (1,000 mg Oral Given 02/10/15 1229)    Discharge Medication List as of 02/10/2015 11:57 AM    START taking these medications   Details  metroNIDAZOLE (FLAGYL) 500 MG tablet Take 1 tablet (500 mg total) by mouth 2 (two) times daily., Starting 02/10/2015, Until Discontinued, Print        The patient appears reasonably screen and/or stabilized for discharge and I doubt any other medical condition or other [facility] requiring further  screening, evaluation, or treatment in the ED at this time prior to discharge.      Deno Etienne, DO 02/10/15 1445

## 2015-02-10 NOTE — Discharge Instructions (Signed)

## 2015-02-10 NOTE — ED Notes (Signed)
Patient c/o lower abd pain, patient states this has been going on for awhile, some LLQ pain that started yesterday as well. Denies N/V/D. Patient reports IBS, last BM 02/07/2015.

## 2015-02-11 LAB — GC/CHLAMYDIA PROBE AMP (~~LOC~~) NOT AT ARMC
CHLAMYDIA, DNA PROBE: NEGATIVE
Neisseria Gonorrhea: NEGATIVE

## 2015-03-18 ENCOUNTER — Other Ambulatory Visit: Payer: Self-pay | Admitting: Family Medicine

## 2015-03-20 NOTE — Telephone Encounter (Signed)
Kellie Curtis   Is patient still taking birthcontrol?  Does she need refill?

## 2015-05-08 ENCOUNTER — Encounter: Payer: BLUE CROSS/BLUE SHIELD | Admitting: Internal Medicine

## 2015-05-08 NOTE — Progress Notes (Signed)
Subjective:    Patient ID: Kellie Curtis, female    DOB: 1971/03/12, 45 y.o.   MRN: [id number]  HPI Error - no show  Patient Active Problem List   Diagnosis Date Noted  . Perimenopause 06/06/2014  . Thickened endometrium 06/06/2014  . Low grade squamous intraepithelial lesion (LGSIL) on Papanicolaou smear of cervix 05/25/2014  . Hemoptysis 05/10/2013  . Acute bronchitis 01/11/2013  . Chest pain 01/11/2013  . Anemia, unspecified 10/04/2012  . Allergic rhinitis, cause unspecified 03/10/2012  . Irregular menses 05/30/2011  . Irritable bowel syndrome with constipation 05/30/2011  . Preventative health care 05/28/2011  . GERD 05/25/2010  . HIATAL HERNIA WITH REFLUX 05/21/2010  . COLONIC POLYPS, HX OF 05/21/2010  . CONSTIPATION 04/27/2010  . ANXIETY 03/14/2008  . DEPRESSION 03/14/2008  . ADHD 03/14/2008  . SEIZURE DISORDER 02/13/2008  . HYPERLIPIDEMIA 02/07/2008  . BACK PAIN 02/07/2008    Current Outpatient Prescriptions on File Prior to Visit  Medication Sig Dispense Refill  . albuterol (PROVENTIL HFA;VENTOLIN HFA) 108 (90 BASE) MCG/ACT inhaler Inhale 2 puffs into the lungs every 6 (six) hours as needed for wheezing or shortness of breath. (Patient not taking: Reported on 02/10/2015) 1 Inhaler 2  . ferrous sulfate 325 (65 FE) MG tablet Take 1 tablet (325 mg total) by mouth daily with breakfast. (Patient not taking: Reported on 02/10/2015) 90 tablet 1  . levonorgestrel-ethinyl estradiol (ORSYTHIA) 0.1-20 MG-MCG tablet Take 1 tablet by mouth daily. 28 tablet 0  . LORazepam (ATIVAN) 0.5 MG tablet Take 1 tablet (0.5 mg total) by mouth 2 (two) times daily as needed for anxiety or sleep. (Patient not taking: Reported on 02/10/2015) 30 tablet 0  . meloxicam (MOBIC) 15 MG tablet Take 1 tablet (15 mg total) by mouth daily. On day 18-28 of menstrual cycle (Patient not taking: Reported on 02/10/2015) 30 tablet 4  . methylphenidate (METADATE CD) 40 MG CR capsule Take 40 mg by mouth daily.    .  metroNIDAZOLE (FLAGYL) 500 MG tablet Take 1 tablet (500 mg total) by mouth 2 (two) times daily. 14 tablet 0  . pantoprazole (PROTONIX) 40 MG tablet Take 1 tablet (40 mg total) by mouth daily. 30 min before breakfast (Patient not taking: Reported on 02/10/2015) 30 tablet 1  . polyethylene glycol powder (GLYCOLAX/MIRALAX) powder Take 17 g by mouth 2 (two) times daily. (Patient not taking: Reported on 02/10/2015) 500 g 11  . sucralfate (CARAFATE) 1 G tablet Take 1 tablet (1 g total) by mouth 4 (four) times daily -  with meals and at bedtime. (Patient not taking: Reported on 02/10/2015) 40 tablet 0  . valACYclovir (VALTREX) 1000 MG tablet Use at the first sign of a cold sore. Take 2 pills, then take 2 pills 12 hours later 20 tablet 0   No current facility-administered medications on file prior to visit.    Past Medical History  Diagnosis Date  . Learning disorder   . Seizure disorder ([place])   . Personal history of colonic polyps 05/12/2010    TUBULAR ADENOMA  . Abdominal pain, left upper quadrant   . Hiatal hernia   . Abdominal pain, left lower quadrant   . Unspecified constipation   . Abdominal pain, left upper quadrant   . Abdominal pain, epigastric   . Wheezing   . Chest pain, unspecified   . Fever, unspecified   . Attention deficit disorder with hyperactivity(314.01)   . Elevated blood pressure reading without diagnosis of hypertension   . Depressive disorder, not  elsewhere classified   . Anxiety state, unspecified   . Other convulsions   . Backache, unspecified   . Chest pain, unspecified   . Acute sinusitis, unspecified   . Other and unspecified hyperlipidemia   . IBS (irritable bowel syndrome)   . Depression 10/05/2012  . Anemia   . Dyslexia     Past Surgical History  Procedure Laterality Date  . Cesarean section      X 2  . Tonsillectomy    . Tubal ligation      Social History   Social History  . Marital Status: Single    Spouse Name: N/A  . Number of Children: 3  .  Years of Education: N/A   Occupational History  .  [place] History Main Topics  . Smoking status: Never Smoker   . Smokeless tobacco: Never Used  . Alcohol Use: 1.2 oz/week    2 Cans of beer per week     Comment: per week  . Drug Use: No  . Sexual Activity: Yes    Birth Control/ Protection: Pill     Comment: 1ST INTERCOURSE- 15, PARTNERS- GREATER THAN 5   Other Topics Concern  . Not on file   Social History Narrative    Family History  Problem Relation Age of Onset  . Stroke Father   . Hypertension Father   . Gout Father   . Hyperlipidemia Father   . Colon cancer Father   . Alcohol abuse      grandfather  . Breast cancer      grandmother  . Hypertension      grandmother  . Asthma      grandmother  . Asthma Mother   . Breast cancer Maternal Grandmother     Review of Systems     Objective:  There were no vitals filed for this visit. There were no vitals filed for this visit. There is no weight on file to calculate BMI.   Physical Exam        Assessment & Plan:    This encounter was created in error - please disregard.

## 2015-05-25 ENCOUNTER — Other Ambulatory Visit: Payer: Self-pay | Admitting: Family Medicine

## 2015-05-26 NOTE — Telephone Encounter (Signed)
Spoke with patient calling to make an appointment.  Refilled one more time

## 2015-06-29 ENCOUNTER — Emergency Department (HOSPITAL_COMMUNITY)
Admission: EM | Admit: 2015-06-29 | Discharge: 2015-06-29 | Disposition: A | Discharge status: Home / Self Care | Payer: BLUE CROSS/BLUE SHIELD | Attending: Emergency Medicine | Admitting: Emergency Medicine

## 2015-06-29 ENCOUNTER — Emergency Department (HOSPITAL_COMMUNITY): Payer: BLUE CROSS/BLUE SHIELD

## 2015-06-29 ENCOUNTER — Encounter (HOSPITAL_COMMUNITY): Payer: Self-pay | Admitting: *Deleted

## 2015-06-29 DIAGNOSIS — Z79899 Other long term (current) drug therapy: Secondary | ICD-10-CM | POA: Insufficient documentation

## 2015-06-29 DIAGNOSIS — F411 Generalized anxiety disorder: Secondary | ICD-10-CM | POA: Diagnosis not present

## 2015-06-29 DIAGNOSIS — R079 Chest pain, unspecified: Secondary | ICD-10-CM | POA: Diagnosis present

## 2015-06-29 DIAGNOSIS — R05 Cough: Secondary | ICD-10-CM | POA: Insufficient documentation

## 2015-06-29 DIAGNOSIS — Z86010 Personal history of colonic polyps: Secondary | ICD-10-CM | POA: Insufficient documentation

## 2015-06-29 DIAGNOSIS — F909 Attention-deficit hyperactivity disorder, unspecified type: Secondary | ICD-10-CM | POA: Diagnosis not present

## 2015-06-29 DIAGNOSIS — K5900 Constipation, unspecified: Secondary | ICD-10-CM | POA: Insufficient documentation

## 2015-06-29 DIAGNOSIS — Z8639 Personal history of other endocrine, nutritional and metabolic disease: Secondary | ICD-10-CM | POA: Diagnosis not present

## 2015-06-29 DIAGNOSIS — F329 Major depressive disorder, single episode, unspecified: Secondary | ICD-10-CM | POA: Diagnosis not present

## 2015-06-29 DIAGNOSIS — D649 Anemia, unspecified: Secondary | ICD-10-CM | POA: Insufficient documentation

## 2015-06-29 DIAGNOSIS — R0789 Other chest pain: Secondary | ICD-10-CM | POA: Insufficient documentation

## 2015-06-29 LAB — BASIC METABOLIC PANEL
ANION GAP: 8 (ref 5–15)
BUN: 10 mg/dL (ref 6–20)
CHLORIDE: 104 mmol/L (ref 101–111)
CO2: 25 mmol/L (ref 22–32)
Calcium: 9.5 mg/dL (ref 8.9–10.3)
Creatinine, Ser: 0.97 mg/dL (ref 0.44–1.00)
GFR calc non Af Amer: >60 mL/min (ref >60)
Glucose, Bld: 90 mg/dL (ref 65–99)
Potassium: 4.4 mmol/L (ref 3.5–5.1)
Sodium: 137 mmol/L (ref 135–145)

## 2015-06-29 LAB — CBC
HCT: 38.9 % (ref 36.0–46.0)
HEMOGLOBIN: 12.9 g/dL (ref 12.0–15.0)
MCH: 30.3 pg (ref 26.0–34.0)
MCHC: 33.2 g/dL (ref 30.0–36.0)
MCV: 91.3 fL (ref 78.0–100.0)
Platelets: 346 10*3/uL (ref 150–400)
RBC: 4.26 MIL/uL (ref 3.87–5.11)
RDW: 13.3 % (ref 11.5–15.5)
WBC: 3.9 10*3/uL — AB (ref 4.0–10.5)

## 2015-06-29 LAB — I-STAT TROPONIN, ED
TROPONIN I, POC: 0 ng/mL (ref 0.00–0.08)
TROPONIN I, POC: 0 ng/mL (ref 0.00–0.08)

## 2015-06-29 MED ORDER — OMEPRAZOLE 20 MG PO CPDR: DELAYED_RELEASE_CAPSULE | ORAL | Status: DC

## 2015-06-29 MED ORDER — GI COCKTAIL ~~LOC~~
30.0000 mL | Freq: Once | ORAL | Status: AC
  Administered 2015-06-29: 30 mL via ORAL
  Filled 2015-06-29: qty 30

## 2015-06-29 MED ORDER — ALBUTEROL SULFATE HFA 108 (90 BASE) MCG/ACT IN AERS
2.0000 | INHALATION_SPRAY | RESPIRATORY_TRACT | Status: DC | PRN
  Administered 2015-06-29: 2 via RESPIRATORY_TRACT
  Filled 2015-06-29: qty 6.7

## 2015-06-29 MED ORDER — NAPROXEN 500 MG PO TABS: 500.0000 mg | ORAL_TABLET | Freq: Two times a day (BID) | ORAL | Status: DC

## 2015-06-29 MED ORDER — SUCRALFATE 1 G PO TABS: 1.0000 g | ORAL_TABLET | Freq: Three times a day (TID) | ORAL | Status: DC

## 2015-06-29 NOTE — ED Notes (Signed)
Josh PA at the bedside

## 2015-06-29 NOTE — ED Provider Notes (Signed)
CSN: [id number]     Arrival date & time 06/29/15  1213 History   First MD Initiated Contact with Patient 06/29/15 1742     Chief Complaint  Patient presents with  . Chest Pain    (Consider location/radiation/quality/duration/timing/severity/associated sxs/prior Treatment) HPI Comments: Patient with h/o GERD, not currently taking medications for this -- presents with complaint of chest pain which has been ongoing for 2 days. It is described as an aching pain across her entire chest but more focally in the left chest today. No associated diaphoresis, exertional pain, vomiting. Symptoms are not changed by food. Pain is made worse with palpation. No shortness of breath or lower extremity symptoms. Patient has had an occasional cough that is productive of yellow sputum at times. No hemoptysis. Patient denies risk factors for pulmonary embolism including: unilateral leg swelling, history of DVT/PE/other blood clots, use of exogenous hormones, recent immobilizations, recent surgery, recent travel (>4hr segment), malignancy, hemoptysis. Patient does not have a history of hypertension, diabetes, smoking, family history (although she states her knowledge of family history is limited). States that one time her cholesterol was high but only treatment was diet modification and cholesterol levels have been normal since then. The onset of this condition was acute. The course is constant. Aggravating factors: palapation. Alleviating factors: none. She does wonder if symptoms could be stress-related as she has been going through a separation.      Patient is a 45 y.o. female presenting with chest pain. The history is provided by the patient.  Chest Pain Associated symptoms: no abdominal pain, no back pain, no cough, no diaphoresis, no fever, no nausea, no palpitations, no shortness of breath and not vomiting     Past Medical History  Diagnosis Date  . Learning disorder   . Seizure disorder ([place])   . Personal  history of colonic polyps 05/12/2010    TUBULAR ADENOMA  . Abdominal pain, left upper quadrant   . Hiatal hernia   . Abdominal pain, left lower quadrant   . Unspecified constipation   . Abdominal pain, left upper quadrant   . Abdominal pain, epigastric   . Wheezing   . Chest pain, unspecified   . Fever, unspecified   . Attention deficit disorder with hyperactivity(314.01)   . Elevated blood pressure reading without diagnosis of hypertension   . Depressive disorder, not elsewhere classified   . Anxiety state, unspecified   . Other convulsions   . Backache, unspecified   . Chest pain, unspecified   . Acute sinusitis, unspecified   . Other and unspecified hyperlipidemia   . IBS (irritable bowel syndrome)   . Depression 10/05/2012  . Anemia   . Dyslexia    Past Surgical History  Procedure Laterality Date  . Cesarean section      X 2  . Tonsillectomy    . Tubal ligation     Family History  Problem Relation Age of Onset  . Stroke Father   . Hypertension Father   . Gout Father   . Hyperlipidemia Father   . Colon cancer Father   . Alcohol abuse      grandfather  . Breast cancer      grandmother  . Hypertension      grandmother  . Asthma      grandmother  . Asthma Mother   . Breast cancer Maternal Grandmother    Social History  Substance Use Topics  . Smoking status: Never Smoker   . Smokeless tobacco: Never Used  .  Alcohol Use: 1.2 oz/week    2 Cans of beer per week     Comment: per week   OB History    Gravida Para Term Preterm AB TAB SAB Ectopic Multiple Living   3 3        3      Review of Systems  Constitutional: Negative for fever and diaphoresis.  Eyes: Negative for redness.  Respiratory: Negative for cough and shortness of breath.   Cardiovascular: Positive for chest pain. Negative for palpitations and leg swelling.  Gastrointestinal: Negative for nausea, vomiting and abdominal pain.  Genitourinary: Negative for dysuria.  Musculoskeletal: Negative for  back pain and neck pain.  Skin: Negative for rash.  Neurological: Negative for syncope and light-headedness.  Psychiatric/Behavioral: The patient is not nervous/anxious.     Allergies  Hydrocodone-homatropine  Home Medications   Prior to Admission medications   Medication Sig Start Date End Date Taking? Authorizing Provider  albuterol (PROVENTIL HFA;VENTOLIN HFA) 108 (90 BASE) MCG/ACT inhaler Inhale 2 puffs into the lungs every 6 (six) hours as needed for wheezing or shortness of breath. Patient not taking: Reported on 02/10/2015 04/11/14   Darreld Mclean, MD  ferrous sulfate 325 (65 FE) MG tablet Take 1 tablet (325 mg total) by mouth daily with breakfast. Patient not taking: Reported on 02/10/2015 06/02/14   Shawnee Knapp, MD  LORazepam (ATIVAN) 0.5 MG tablet Take 1 tablet (0.5 mg total) by mouth 2 (two) times daily as needed for anxiety or sleep. Patient not taking: Reported on 02/10/2015 10/08/14   Shawnee Knapp, MD  meloxicam (MOBIC) 15 MG tablet Take 1 tablet (15 mg total) by mouth daily. On day 18-28 of menstrual cycle Patient not taking: Reported on 02/10/2015 10/08/14   Shawnee Knapp, MD  methylphenidate (METADATE CD) 40 MG CR capsule Take 40 mg by mouth daily.    Historical Provider, MD  metroNIDAZOLE (FLAGYL) 500 MG tablet Take 1 tablet (500 mg total) by mouth 2 (two) times daily. 02/10/15   Deno Etienne, DO  ORSYTHIA 0.1-20 MG-MCG tablet TAKE 1 TABLET BY MOUTH DAILY. (PATIENT NEEDS OFFICE VISIT) 05/26/15   Mancel Bale, PA-C  pantoprazole (PROTONIX) 40 MG tablet Take 1 tablet (40 mg total) by mouth daily. 30 min before breakfast Patient not taking: Reported on 02/10/2015 10/08/14   Shawnee Knapp, MD  polyethylene glycol powder (GLYCOLAX/MIRALAX) powder Take 17 g by mouth 2 (two) times daily. Patient not taking: Reported on 02/10/2015 07/10/14   Shawnee Knapp, MD  sucralfate (CARAFATE) 1 G tablet Take 1 tablet (1 g total) by mouth 4 (four) times daily -  with meals and at bedtime. Patient not taking: Reported  on 02/10/2015 07/04/14   Darreld Mclean, MD  valACYclovir (VALTREX) 1000 MG tablet Use at the first sign of a cold sore. Take 2 pills, then take 2 pills 12 hours later 07/21/14   Gay Filler Copland, MD   BP 114/66 mmHg  Pulse 77  Temp(Src) 97.6 F (36.4 C) (Oral)  Resp 14  SpO2 100%  LMP 06/12/2015   Physical Exam  Constitutional: She appears well-developed and well-nourished.  HENT:  Head: Normocephalic and atraumatic.  Mouth/Throat: Mucous membranes are normal. Mucous membranes are not dry.  Eyes: Conjunctivae are normal.  Neck: Trachea normal and normal range of motion. Neck supple. Normal carotid pulses and no JVD present. No muscular tenderness present. Carotid bruit is not present. No tracheal deviation present.  Cardiovascular: Normal rate, regular rhythm, S1 normal, S2 normal,  normal heart sounds and intact distal pulses.  Exam reveals no decreased pulses.   No murmur heard. Pulmonary/Chest: Effort normal. No respiratory distress. She has no wheezes. She exhibits tenderness (Across L sternal border, palpation reproduces pain).  Abdominal: Soft. Normal aorta and bowel sounds are normal. There is no tenderness. There is no rebound and no guarding.  Musculoskeletal: Normal range of motion.  Neurological: She is alert.  Skin: Skin is warm and dry. She is not diaphoretic. No cyanosis. No pallor.  Psychiatric: She has a normal mood and affect.  Nursing note and vitals reviewed.   ED Course  Procedures (including critical care time) Labs Review Labs Reviewed  CBC - Abnormal; Notable for the following:    WBC 3.9 (*)    All other components within normal limits  BASIC METABOLIC PANEL  I-STAT TROPOININ, ED  Randolm Idol, ED    Imaging Review Dg Chest 2 View  06/29/2015  CLINICAL DATA:  45 year old female with chest pain and cough EXAM: CHEST  2 VIEW COMPARISON:  Prior chest x-ray 07/21/2014 FINDINGS: The lungs are clear and negative for focal airspace consolidation,  pulmonary edema or suspicious pulmonary nodule. No pleural effusion or pneumothorax. Cardiac and mediastinal contours are within normal limits. No acute fracture or lytic or blastic osseous lesions. The visualized upper abdominal bowel gas pattern is unremarkable. IMPRESSION: Normal chest x-ray. Electronically Signed   By: Jacqulynn Cadet M.D.   On: 06/29/2015 13:13   I have personally reviewed and evaluated these images and lab results as part of my medical decision-making.   EKG Interpretation None       6:00 PM Patient seen and examined. Will check 2nd marker. Suspect this is GI related or chest wall pain. Will also need PCP referral.    ED ECG REPORT   Date: 06/29/2015  Rate: 85  Rhythm: normal sinus rhythm  QRS Axis: normal  Intervals: normal  ST/T Wave abnormalities: normal  Conduction Disutrbances:none  Narrative Interpretation:   Old EKG Reviewed: unchanged  I have personally reviewed the EKG tracing and agree with the computerized printout as noted.   Vital signs reviewed and are as follows: BP 114/66 mmHg  Pulse 77  Temp(Src) 97.6 F (36.4 C) (Oral)  Resp 14  SpO2 100%  LMP 06/12/2015  6:32 PM 2nd marker is negative.   Pt updated. Will treat several things. For cough, will give inhaler. For h/o GERD will restart PPI and carafate. For chest wall pain, scheduled naproxen with food.   PCP referral given, encouraged follow-up.   Patient was counseled to return with severe chest pain, especially if the pain is crushing or pressure-like and spreads to the arms, back, neck, or jaw, or if they have sweating, nausea, or shortness of breath with the pain. They were encouraged to call 911 with these symptoms.   They were also told to return if their chest pain gets worse and does not go away with rest, they have an attack of chest pain lasting longer than usual despite rest and treatment with the medications their caregiver has prescribed, if they wake from sleep with  chest pain or shortness of breath, if they feel dizzy or faint, if they have chest pain not typical of their usual pain, or if they have any other emergent concerns regarding their health.  The patient verbalized understanding and agreed.    MDM   Final diagnoses:  Chest pain, unspecified chest pain type   Pt with atypical CP, reproducible. Normal EKG,  trop x2 neg. HEART score = 1. CXR clear. PERC neg. Feel low risk for PE and ACS. Will treat obvious causes including bronchitis, GERD, and chest wall pain. Encouraged PCP f/u. Return instructions as above.    Carlisle Cater, PA-C 06/29/15 [place], MD 06/29/15 [phone]

## 2015-06-29 NOTE — Discharge Instructions (Signed)
Please read and follow all provided instructions.  Your diagnoses today include:  1. Chest pain, unspecified chest pain type     Tests performed today include:  An EKG of your heart - normal  A chest x-ray - no pneumonia  Cardiac enzymes - a blood test for heart muscle damage, no sign of heart damage  Blood counts and electrolytes  Vital signs. See below for your results today.   Medications prescribed:   Albuterol inhaler - medication that opens up your airway  Use inhaler as follows: 1-2 puffs with spacer every 4 hours as needed for wheezing, cough, or shortness of breath.    Omeprazole (Prilosec) - stomach acid reducer  This medication can be found over-the-counter   Carafate - for stomach upset and to protect your stomach   Naproxen - anti-inflammatory pain medication  Do not exceed 500mg  naproxen every 12 hours, take with food  You have been prescribed an anti-inflammatory medication or NSAID. Take with food. Take smallest effective dose for the shortest duration needed for your pain. Stop taking if you experience stomach pain or vomiting.   Take any prescribed medications only as directed.  Follow-up instructions: Please follow-up with your primary care provider as soon as you can for further evaluation of your symptoms.   Return instructions:  SEEK IMMEDIATE MEDICAL ATTENTION IF:  You have severe chest pain, especially if the pain is crushing or pressure-like and spreads to the arms, back, neck, or jaw, or if you have sweating, nausea (feeling sick to your stomach), or shortness of breath. THIS IS AN EMERGENCY. Don't wait to see if the pain will go away. Get medical help at once. Call 911 or 0 (operator). DO NOT drive yourself to the hospital.   Your chest pain gets worse and does not go away with rest.   You have an attack of chest pain lasting longer than usual, despite rest and treatment with the medications your caregiver has prescribed.   You wake from  sleep with chest pain or shortness of breath.  You feel dizzy or faint.  You have chest pain not typical of your usual pain for which you originally saw your caregiver.   You have any other emergent concerns regarding your health.  Additional Information: Chest pain comes from many different causes. Your caregiver has diagnosed you as having chest pain that is not specific for one problem, but does not require admission.  You are at low risk for an acute heart condition or other serious illness.   Your vital signs today were: BP 118/82 mmHg   Pulse 70   Temp(Src) 97.6 F (36.4 C) (Oral)   Resp 16   SpO2 98%   LMP 06/12/2015 If your blood pressure (BP) was elevated above 135/85 this visit, please have this repeated by your doctor within one month. --------------

## 2015-06-29 NOTE — ED Notes (Signed)
Pt reports mid to left side chest pain for several days with sob. No acute distress noted, airway intact, ekg done.

## 2015-10-27 ENCOUNTER — Ambulatory Visit (INDEPENDENT_AMBULATORY_CARE_PROVIDER_SITE_OTHER): Payer: BLUE CROSS/BLUE SHIELD | Admitting: Internal Medicine

## 2015-10-27 ENCOUNTER — Encounter: Payer: Self-pay | Admitting: Internal Medicine

## 2015-10-27 VITALS — BP 120/68 | HR 86 | Temp 99.0°F | Resp 20 | Wt 180.0 lb

## 2015-10-27 DIAGNOSIS — J309 Allergic rhinitis, unspecified: Secondary | ICD-10-CM

## 2015-10-27 DIAGNOSIS — Z0001 Encounter for general adult medical examination with abnormal findings: Secondary | ICD-10-CM

## 2015-10-27 DIAGNOSIS — F32A Depression, unspecified: Secondary | ICD-10-CM

## 2015-10-27 DIAGNOSIS — R6889 Other general symptoms and signs: Secondary | ICD-10-CM

## 2015-10-27 DIAGNOSIS — J019 Acute sinusitis, unspecified: Secondary | ICD-10-CM | POA: Diagnosis not present

## 2015-10-27 DIAGNOSIS — F329 Major depressive disorder, single episode, unspecified: Secondary | ICD-10-CM

## 2015-10-27 MED ORDER — HYDROCODONE-HOMATROPINE 5-1.5 MG/5ML PO SYRP: 5.0000 mL | ORAL_SOLUTION | Freq: Four times a day (QID) | ORAL | 0 refills | Status: DC | PRN

## 2015-10-27 MED ORDER — PROMETHAZINE-CODEINE 6.25-10 MG/5ML PO SYRP: 5.0000 mL | ORAL_SOLUTION | Freq: Four times a day (QID) | ORAL | 0 refills | Status: AC | PRN

## 2015-10-27 MED ORDER — LEVOFLOXACIN 500 MG PO TABS: 500.0000 mg | ORAL_TABLET | Freq: Every day | ORAL | 0 refills | Status: AC

## 2015-10-27 NOTE — Progress Notes (Signed)
Subjective:    Patient ID: Kellie Curtis, female    DOB: Jul 10, 1970, 45 y.o.   MRN: [id number]  HPI   Here with 2-3 days acute onset fever, facial pain, pressure, headache, general weakness and malaise, and greenish d/c, with mild ST and cough, but pt denies chest pain, wheezing, increased sob or doe, orthopnea, PND, increased LE swelling, palpitations, dizziness or syncope. Does have several wks ongoing nasal allergy symptoms with clearish congestion, itch and sneezing, without fever, pain, ST, cough, swelling or wheezing.  Denies worsening depressive symptoms, suicidal ideation, or panic; has ongoing anxiety Past Medical History:  Diagnosis Date  . Abdominal pain, epigastric   . Abdominal pain, left lower quadrant   . Abdominal pain, left upper quadrant   . Abdominal pain, left upper quadrant   . Acute sinusitis, unspecified   . Anemia   . Anxiety state, unspecified   . Attention deficit disorder with hyperactivity(314.01)   . Backache, unspecified   . Chest pain, unspecified   . Chest pain, unspecified   . Depression 10/05/2012  . Depressive disorder, not elsewhere classified   . Dyslexia   . Elevated blood pressure reading without diagnosis of hypertension   . Fever, unspecified   . Hiatal hernia   . IBS (irritable bowel syndrome)   . Learning disorder   . Other and unspecified hyperlipidemia   . Other convulsions   . Personal history of colonic polyps 05/12/2010   TUBULAR ADENOMA  . Seizure disorder ([place])   . Unspecified constipation   . Wheezing    Past Surgical History:  Procedure Laterality Date  . CESAREAN SECTION     X 2  . TONSILLECTOMY    . TUBAL LIGATION      reports that she has never smoked. She has never used smokeless tobacco. She reports that she drinks about 1.2 oz of alcohol per week . She reports that she does not use drugs. family history includes Asthma in her mother; Breast cancer in her maternal grandmother; Colon cancer in her father; Gout in her  father; Hyperlipidemia in her father; Hypertension in her father; Stroke in her father. Allergies  Allergen Reactions  . Hydrocodone-Homatropine Itching   Current Outpatient Prescriptions on File Prior to Visit  Medication Sig Dispense Refill  . methylphenidate (METADATE CD) 40 MG CR capsule Take 40 mg by mouth daily.    . metroNIDAZOLE (FLAGYL) 500 MG tablet Take 1 tablet (500 mg total) by mouth 2 (two) times daily. (Patient not taking: Reported on 10/27/2015) 14 tablet 0  . naproxen (NAPROSYN) 500 MG tablet Take 1 tablet (500 mg total) by mouth 2 (two) times daily. (Patient not taking: Reported on 10/27/2015) 20 tablet 0  . omeprazole (PRILOSEC) 20 MG capsule Take one capsule PO twice a day for 3 days, then one capsule PO once a day (Patient not taking: Reported on 10/27/2015) 30 capsule 0  . ORSYTHIA 0.1-20 MG-MCG tablet TAKE 1 TABLET BY MOUTH DAILY. (PATIENT NEEDS OFFICE VISIT) (Patient not taking: Reported on 10/27/2015) 28 tablet 0  . sucralfate (CARAFATE) 1 g tablet Take 1 tablet (1 g total) by mouth 4 (four) times daily -  with meals and at bedtime. (Patient not taking: Reported on 10/27/2015) 60 tablet 1  . valACYclovir (VALTREX) 1000 MG tablet Use at the first sign of a cold sore. Take 2 pills, then take 2 pills 12 hours later (Patient not taking: Reported on 10/27/2015) 20 tablet 0  . [DISCONTINUED] pantoprazole (PROTONIX) 40 MG tablet Take  1 tablet (40 mg total) by mouth daily. 30 min before breakfast (Patient not taking: Reported on 02/10/2015) 30 tablet 1   No current facility-administered medications on file prior to visit.    Review of Systems  Constitutional: Negative for unusual diaphoresis or night sweats HENT: Negative for ear swelling or discharge Eyes: Negative for worsening visual haziness  Respiratory: Negative for choking and stridor.   Gastrointestinal: Negative for distension or worsening eructation Genitourinary: Negative for retention or change in urine volume.    Musculoskeletal: Negative for other MSK pain or swelling Skin: Negative for color change and worsening wound Neurological: Negative for tremors and numbness other than noted  Psychiatric/Behavioral: Negative for decreased concentration or agitation other than above       Objective:   Physical Exam BP 120/68   Pulse 86   Temp 99 F (37.2 C) (Oral)   Resp 20   Wt 180 lb (81.6 kg)   SpO2 90%   BMI 30.90 kg/m  VS noted,  Constitutional: Pt appears in no apparent distress HENT: Head: NCAT.  Right Ear: External ear normal.  Left Ear: External ear normal.  Bilat tm's with mild erythema.  Max sinus areas mild tender.  Pharynx with mild erythema, no exudate Eyes: . Pupils are equal, round, and reactive to light. Conjunctivae and EOM are normal Neck: Normal range of motion. Neck supple.  Cardiovascular: Normal rate and regular rhythm.   Pulmonary/Chest: Effort normal and breath sounds without rales or wheezing.  Neurological: Pt is alert. Not confused , motor grossly intact Skin: Skin is warm. No rash, no LE edema Psychiatric: Pt behavior is normal. No agitation. not depressed affect, mild nervous    Assessment & Plan:

## 2015-10-27 NOTE — Progress Notes (Signed)
Pre visit review using our clinic review tool, if applicable. No additional management support is needed unless otherwise documented below in the visit note. 

## 2015-10-27 NOTE — Patient Instructions (Signed)
Please take all new medication as prescribed - the antibiotic, and cough medicine  Please continue all other medications as before, and refills have been done if requested.  Please have the pharmacy call with any other refills you may need.  Please keep your appointments with your specialists as you may have planned  Please return in 3 months, or sooner if needed, with Lab testing done 3-5 days before

## 2015-11-01 NOTE — Assessment & Plan Note (Signed)
Mild to mod, for otc meds zyrtec, nasacort prn,  to f/u any worsening symptoms or concerns

## 2015-11-01 NOTE — Assessment & Plan Note (Signed)
stable overall by history and exam, recent data reviewed with pt, and pt to continue medical treatment as before,  to f/u any worsening symptoms or concerns Lab Results  Component Value Date   WBC 3.9 (L) 06/29/2015   HGB 12.9 06/29/2015   HCT 38.9 06/29/2015   PLT 346 06/29/2015   GLUCOSE 90 06/29/2015   CHOL 168 07/21/2014   TRIG 60 07/21/2014   HDL 99 07/21/2014   LDLDIRECT 79.6 05/30/2011   LDLCALC 57 07/21/2014   ALT 9 07/04/2014   AST 12 07/04/2014   NA 137 06/29/2015   K 4.4 06/29/2015   CL 104 06/29/2015   CREATININE 0.97 06/29/2015   BUN 10 06/29/2015   CO2 25 06/29/2015   TSH 1.030 05/22/2014   INR 1.0 02/11/2008

## 2015-11-01 NOTE — Assessment & Plan Note (Signed)
Mild to mod, for antibx course,  to f/u any worsening symptoms or concerns 

## 2015-11-02 ENCOUNTER — Encounter: Payer: Self-pay | Admitting: Gastroenterology

## 2015-11-16 ENCOUNTER — Ambulatory Visit (AMBULATORY_SURGERY_CENTER): Payer: Self-pay

## 2015-11-16 ENCOUNTER — Encounter: Payer: Self-pay | Admitting: Gastroenterology

## 2015-11-16 VITALS — Ht 64.0 in | Wt 177.0 lb

## 2015-11-16 DIAGNOSIS — Z860100 Personal history of colon polyps, unspecified: Secondary | ICD-10-CM

## 2015-11-16 MED ORDER — SUPREP BOWEL PREP KIT 17.5-3.13-1.6 GM/177ML PO SOLN: 1.0000 | Freq: Once | ORAL | 0 refills | Status: AC

## 2015-11-16 NOTE — Progress Notes (Signed)
No allergies to eggs or soy No past problems with anesthesia No diet meds No home oxygen  Has email and internet; refused emmi

## 2015-11-23 ENCOUNTER — Telehealth: Payer: Self-pay | Admitting: Gastroenterology

## 2015-11-24 NOTE — Telephone Encounter (Signed)
Left message that I would leave a Suprep sample up front with patient's name on it to be picked up.

## 2015-11-27 ENCOUNTER — Encounter: Payer: Self-pay | Admitting: Gastroenterology

## 2015-11-27 ENCOUNTER — Ambulatory Visit (AMBULATORY_SURGERY_CENTER): Payer: BLUE CROSS/BLUE SHIELD | Admitting: Gastroenterology

## 2015-11-27 VITALS — BP 129/82 | HR 74 | Temp 98.2°F | Resp 20 | Ht 64.0 in | Wt 177.0 lb

## 2015-11-27 DIAGNOSIS — Z86010 Personal history of colonic polyps: Secondary | ICD-10-CM | POA: Diagnosis not present

## 2015-11-27 DIAGNOSIS — K635 Polyp of colon: Secondary | ICD-10-CM

## 2015-11-27 DIAGNOSIS — D123 Benign neoplasm of transverse colon: Secondary | ICD-10-CM | POA: Diagnosis not present

## 2015-11-27 MED ORDER — SODIUM CHLORIDE 0.9 % IV SOLN: 500.0000 mL | INTRAVENOUS | Status: DC

## 2015-11-27 NOTE — Progress Notes (Signed)
To pacu vss patent aw reprot to rn 

## 2015-11-27 NOTE — Op Note (Signed)
[place] Patient Name: Kellie Curtis Procedure Date: 11/27/2015 3:04 PM MRN: [id number] Endoscopist: Mauri Pole , MD Age: 45 Referring MD:  Date of Birth: [birth year] Gender: Female Account #: [id number] Procedure:                Colonoscopy Indications:              Surveillance: Personal history of adenomatous                            polyps on last colonoscopy 5 years ago Medicines:                Monitored Anesthesia Care Procedure:                Pre-Anesthesia Assessment:                           - Prior to the procedure, a History and Physical                            was performed, and patient medications and                            allergies were reviewed. The patient's tolerance of                            previous anesthesia was also reviewed. The risks                            and benefits of the procedure and the sedation                            options and risks were discussed with the patient.                            All questions were answered, and informed consent                            was obtained. Prior Anticoagulants: The patient has                            taken no previous anticoagulant or antiplatelet                            agents. ASA Grade Assessment: II - A patient with                            mild systemic disease. After reviewing the risks                            and benefits, the patient was deemed in                            satisfactory condition to undergo the procedure.  After obtaining informed consent, the colonoscope                            was passed under direct vision. Throughout the                            procedure, the patient's blood pressure, pulse, and                            oxygen saturations were monitored continuously. The                            Model CF-HQ190L [id number]) scope was introduced                            through the anus and  advanced to the the terminal                            ileum, with identification of the appendiceal                            orifice and IC valve. The colonoscopy was performed                            without difficulty. The patient tolerated the                            procedure well. The quality of the bowel                            preparation was good. The ileocecal valve,                            appendiceal orifice, and rectum were photographed. Scope In: 3:10:29 PM Scope Out: 3:30:06 PM Scope Withdrawal Time: 0 hours 12 minutes 46 seconds  Total Procedure Duration: 0 hours 19 minutes 37 seconds  Findings:                 The perianal and digital rectal examinations were                            normal.                           A 3 mm polyp was found in the transverse colon. The                            polyp was sessile. The polyp was removed with a                            cold biopsy forceps. Resection and retrieval were                            complete.  A 6 mm polyp was found in the transverse colon. The                            polyp was sessile. The polyp was removed with a                            cold snare. Resection and retrieval were complete.                           Non-bleeding internal hemorrhoids were found during                            retroflexion. The hemorrhoids were medium-sized.                           The exam was otherwise without abnormality. Complications:            No immediate complications. Estimated Blood Loss:     Estimated blood loss: none. Impression:               - One 3 mm polyp in the transverse colon, removed                            with a cold biopsy forceps. Resected and retrieved.                           - One 6 mm polyp in the transverse colon, removed                            with a cold snare. Resected and retrieved.                           - Non-bleeding internal  hemorrhoids.                           - The examination was otherwise normal. Recommendation:           - Patient has a contact number available for                            emergencies. The signs and symptoms of potential                            delayed complications were discussed with the                            patient. Return to normal activities tomorrow.                            Written discharge instructions were provided to the                            patient.                           -  Resume previous diet.                           - Continue present medications.                           - Await pathology results.                           - Repeat colonoscopy in 5 years for surveillance                            based on pathology results. Mauri Pole, MD 11/27/2015 3:37:09 PM This report has been signed electronically.

## 2015-11-27 NOTE — Progress Notes (Signed)
Called to room to assist during endoscopic procedure.  Patient ID and intended procedure confirmed with present staff. Received instructions for my participation in the procedure from the performing physician.  

## 2015-11-27 NOTE — Patient Instructions (Addendum)
YOU HAD AN ENDOSCOPIC PROCEDURE TODAY AT [facility]:   Refer to the procedure report that was given to you for any specific questions about what was found during the examination.  If the procedure report does not answer your questions, please call your gastroenterologist to clarify.  If you requested that your care partner not be given the details of your procedure findings, then the procedure report has been included in a sealed envelope for you to review at your convenience later.  YOU SHOULD EXPECT: Some feelings of bloating in the abdomen. Passage of more gas than usual.  Walking can help get rid of the air that was put into your GI tract during the procedure and reduce the bloating. If you had a lower endoscopy (such as a colonoscopy or flexible sigmoidoscopy) you may notice spotting of blood in your stool or on the toilet paper. If you underwent a bowel prep for your procedure, you may not have a normal bowel movement for a few days.  Please Note:  You might notice some irritation and congestion in your nose or some drainage.  This is from the oxygen used during your procedure.  There is no need for concern and it should clear up in a day or so.  SYMPTOMS TO REPORT IMMEDIATELY:   Following lower endoscopy (colonoscopy or flexible sigmoidoscopy):  Excessive amounts of blood in the stool  Significant tenderness or worsening of abdominal pains  Swelling of the abdomen that is new, acute  Fever of 100F or higher  For urgent or emergent issues, a gastroenterologist can be reached at any hour by calling [phone].   DIET:  We do recommend a small meal at first, but then you may proceed to your regular diet.  Drink plenty of fluids but you should avoid alcoholic beverages for 24 hours.  ACTIVITY:  You should plan to take it easy for the rest of today and you should NOT DRIVE or use heavy machinery until tomorrow (because of the sedation medicines used during the test).     FOLLOW UP: Our staff will call the number listed on your records the next business day following your procedure to check on you and address any questions or concerns that you may have regarding the information given to you following your procedure. If we do not reach you, we will leave a message.  However, if you are feeling well and you are not experiencing any problems, there is no need to return our call.  We will assume that you have returned to your regular daily activities without incident.  If any biopsies were taken you will be contacted by phone or by letter within the next 1-3 weeks.  Please call us at [phone] if you have not heard about the biopsies in 3 weeks.    SIGNATURES/CONFIDENTIALITY: You and/or your care partner have signed paperwork which will be entered into your electronic medical record.  These signatures attest to the fact that that the information above on your After Visit Summary has been reviewed and is understood.  Full responsibility of the confidentiality of this discharge information lies with you and/or your care-partner.  Recticare wipes for irritation. Please read polyp and hemorrhoid handouts provided. Work note given for Friday, August 25th, 2017.

## 2015-11-30 ENCOUNTER — Telehealth: Payer: Self-pay | Admitting: *Deleted

## 2015-11-30 NOTE — Telephone Encounter (Signed)
Message left

## 2015-12-06 ENCOUNTER — Encounter: Payer: Self-pay | Admitting: Gastroenterology

## 2015-12-23 ENCOUNTER — Encounter: Payer: Self-pay | Admitting: Internal Medicine

## 2015-12-23 ENCOUNTER — Ambulatory Visit (INDEPENDENT_AMBULATORY_CARE_PROVIDER_SITE_OTHER): Payer: BLUE CROSS/BLUE SHIELD | Admitting: Internal Medicine

## 2015-12-23 ENCOUNTER — Other Ambulatory Visit (INDEPENDENT_AMBULATORY_CARE_PROVIDER_SITE_OTHER): Payer: BLUE CROSS/BLUE SHIELD

## 2015-12-23 VITALS — BP 136/72 | HR 74 | Temp 98.3°F | Resp 20 | Wt 175.0 lb

## 2015-12-23 DIAGNOSIS — Z0000 Encounter for general adult medical examination without abnormal findings: Secondary | ICD-10-CM

## 2015-12-23 LAB — URINALYSIS, ROUTINE W REFLEX MICROSCOPIC
BILIRUBIN URINE: NEGATIVE
HGB URINE DIPSTICK: NEGATIVE
Ketones, ur: NEGATIVE
Leukocytes, UA: NEGATIVE
NITRITE: NEGATIVE
RBC / HPF: NONE SEEN (ref 0–?)
Specific Gravity, Urine: >=1.03 — AB (ref 1.000–1.030)
TOTAL PROTEIN, URINE-UPE24: NEGATIVE
URINE GLUCOSE: NEGATIVE
UROBILINOGEN UA: 0.2 (ref 0.0–1.0)
pH: 5.5 (ref 5.0–8.0)

## 2015-12-23 LAB — CBC WITH DIFFERENTIAL/PLATELET
BASOS ABS: 0 10*3/uL (ref 0.0–0.1)
Basophils Relative: 0.2 % (ref 0.0–3.0)
EOS ABS: 0.1 10*3/uL (ref 0.0–0.7)
Eosinophils Relative: 2.7 % (ref 0.0–5.0)
HCT: 33.9 % — ABNORMAL LOW (ref 36.0–46.0)
Hemoglobin: 11.3 g/dL — ABNORMAL LOW (ref 12.0–15.0)
LYMPHS ABS: 2.2 10*3/uL (ref 0.7–4.0)
Lymphocytes Relative: 41 % (ref 12.0–46.0)
MCHC: 33.4 g/dL (ref 30.0–36.0)
MCV: 85.8 fl (ref 78.0–100.0)
MONO ABS: 0.4 10*3/uL (ref 0.1–1.0)
MONOS PCT: 8 % (ref 3.0–12.0)
NEUTROS PCT: 48.1 % (ref 43.0–77.0)
Neutro Abs: 2.6 10*3/uL (ref 1.4–7.7)
Platelets: 363 10*3/uL (ref 150.0–400.0)
RBC: 3.95 Mil/uL (ref 3.87–5.11)
RDW: 15.4 % (ref 11.5–15.5)
WBC: 5.3 10*3/uL (ref 4.0–10.5)

## 2015-12-23 LAB — BASIC METABOLIC PANEL
BUN: 11 mg/dL (ref 6–23)
CALCIUM: 9.2 mg/dL (ref 8.4–10.5)
CO2: 31 meq/L (ref 19–32)
Chloride: 102 mEq/L (ref 96–112)
Creatinine, Ser: 0.83 mg/dL (ref 0.40–1.20)
GFR: 94.76 mL/min (ref >60.00)
GLUCOSE: 91 mg/dL (ref 70–99)
Potassium: 3.7 mEq/L (ref 3.5–5.1)
SODIUM: 137 meq/L (ref 135–145)

## 2015-12-23 LAB — HEPATIC FUNCTION PANEL
ALBUMIN: 4.3 g/dL (ref 3.5–5.2)
ALK PHOS: 30 U/L — AB (ref 39–117)
ALT: 12 U/L (ref 0–35)
AST: 15 U/L (ref 0–37)
Bilirubin, Direct: 0 mg/dL (ref 0.0–0.3)
TOTAL PROTEIN: 7.7 g/dL (ref 6.0–8.3)
Total Bilirubin: 0.4 mg/dL (ref 0.2–1.2)

## 2015-12-23 LAB — LIPID PANEL
CHOLESTEROL: 203 mg/dL — AB (ref 0–200)
HDL: 116.8 mg/dL (ref >39.00)
LDL Cholesterol: 74 mg/dL (ref 0–99)
NonHDL: 86.44
Total CHOL/HDL Ratio: 2
Triglycerides: 63 mg/dL (ref 0.0–149.0)
VLDL: 12.6 mg/dL (ref 0.0–40.0)

## 2015-12-23 LAB — TSH: TSH: 1.16 u[IU]/mL (ref 0.35–4.50)

## 2015-12-23 MED ORDER — METHYLPHENIDATE HCL ER (CD) 40 MG PO CPCR: 40.0000 mg | ORAL_CAPSULE | ORAL | 0 refills | Status: DC

## 2015-12-23 NOTE — Progress Notes (Signed)
Subjective:    Patient ID: Kellie Curtis, female    DOB: 1970-09-30, 45 y.o.   MRN: [id number]  HPI  Here for wellness and f/u;  Overall doing ok;  Pt denies Chest pain, worsening SOB, DOE, wheezing, orthopnea, PND, worsening LE edema, palpitations, dizziness or syncope.  Pt denies neurological change such as new headache, facial or extremity weakness.  Pt denies polydipsia, polyuria, or low sugar symptoms. Pt states overall good compliance with treatment and medications, good tolerability, and has been trying to follow appropriate diet.  Pt denies worsening depressive symptoms, suicidal ideation or panic. No fever, night sweats, wt loss, loss of appetite, or other constitutional symptoms.  Pt states good ability with ADL's, has low fall risk, home safety reviewed and adequate, no other significant changes in hearing or vision, and only occasionally active with exercise.  Due for TAH due to endometriosis per pt. Also s/p colonoscopy with polyps, non malignant.  Declines tetanus and flu shots.. Past Medical History:  Diagnosis Date  . Abdominal pain, epigastric   . Abdominal pain, left lower quadrant   . Abdominal pain, left upper quadrant   . Abdominal pain, left upper quadrant   . Acute sinusitis, unspecified   . Anemia   . Anxiety state, unspecified   . Attention deficit disorder with hyperactivity(314.01)   . Backache, unspecified   . Chest pain, unspecified   . Chest pain, unspecified   . Depression 10/05/2012  . Depressive disorder, not elsewhere classified   . Dyslexia   . Elevated blood pressure reading without diagnosis of hypertension   . Fever, unspecified   . Hiatal hernia   . IBS (irritable bowel syndrome)   . Learning disorder   . Other and unspecified hyperlipidemia   . Other convulsions   . Personal history of colonic polyps 05/12/2010   TUBULAR ADENOMA  . Seizure disorder ([place])   . Unspecified constipation   . Wheezing    Past Surgical History:  Procedure Laterality  Date  . CESAREAN SECTION     X 2  . TONSILLECTOMY    . TUBAL LIGATION      reports that she has never smoked. She has never used smokeless tobacco. She reports that she drinks about 1.2 oz of alcohol per week . She reports that she does not use drugs. family history includes Asthma in her mother; Breast cancer in her maternal grandmother; Colon cancer in her father; Gout in her father; Hyperlipidemia in her father; Hypertension in her father; Stroke in her father. Allergies  Allergen Reactions  . Tape   . Hydrocodone-Homatropine Itching   Current Outpatient Prescriptions on File Prior to Visit  Medication Sig Dispense Refill  . naproxen (NAPROSYN) 500 MG tablet Take 1 tablet (500 mg total) by mouth 2 (two) times daily. 20 tablet 0  . [DISCONTINUED] pantoprazole (PROTONIX) 40 MG tablet Take 1 tablet (40 mg total) by mouth daily. 30 min before breakfast (Patient not taking: Reported on 02/10/2015) 30 tablet 1   Current Facility-Administered Medications on File Prior to Visit  Medication Dose Route Frequency Provider Last Rate Last Dose  . 0.9 %  sodium chloride infusion  500 mL Intravenous Continuous Mauri Pole, MD       Review of Systems Constitutional: Negative for increased diaphoresis, or other activity, appetite or siginficant weight change other than noted HENT: Negative for worsening hearing loss, ear pain, facial swelling, mouth sores and neck stiffness.   Eyes: Negative for other worsening pain, redness or visual  disturbance.  Respiratory: Negative for choking or stridor Cardiovascular: Negative for other chest pain and palpitations.  Gastrointestinal: Negative for worsening diarrhea, blood in stool, or abdominal distention Genitourinary: Negative for hematuria, flank pain or change in urine volume.  Musculoskeletal: Negative for myalgias or other joint complaints.  Skin: Negative for other color change and wound or drainage.  Neurological: Negative for syncope and  numbness. other than noted Hematological: Negative for adenopathy. or other swelling Psychiatric/Behavioral: Negative for hallucinations, SI, self-injury, decreased concentration or other worsening agitation.      Objective:   Physical Exam BP 136/72   Pulse 74   Temp 98.3 F (36.8 C) (Oral)   Resp 20   Wt 175 lb (79.4 kg)   LMP 11/06/2015 (Approximate)   SpO2 98%   BMI 30.04 kg/m  VS noted,  Constitutional: Pt is oriented to person, place, and time. Appears well-developed and well-nourished, in no significant distress Head: Normocephalic and atraumatic  Eyes: Conjunctivae and EOM are normal. Pupils are equal, round, and reactive to light Right Ear: External ear normal.  Left Ear: External ear normal Nose: Nose normal.  Mouth/Throat: Oropharynx is clear and moist  Neck: Normal range of motion. Neck supple. No JVD present. No tracheal deviation present or significant neck LA or mass Cardiovascular: Normal rate, regular rhythm, normal heart sounds and intact distal pulses.   Pulmonary/Chest: Effort normal and breath sounds without rales or wheezing  Abdominal: Soft. Bowel sounds are normal. NT. No HSM  Musculoskeletal: Normal range of motion. Exhibits no edema Lymphadenopathy: Has no cervical adenopathy.  Neurological: Pt is alert and oriented to person, place, and time. Pt has normal reflexes. No cranial nerve deficit. Motor grossly intact Skin: Skin is warm and dry. No rash noted or new ulcers Psychiatric:  Has normal mood and affect. Behavior is normal.     Assessment & Plan:

## 2015-12-23 NOTE — Patient Instructions (Signed)
Please continue all other medications as before, and refills have been done if requested - the methylphenidate ER 40 mg  Please have the pharmacy call with any other refills you may need.  Please continue your efforts at being more active, low cholesterol diet, and weight control.  You are otherwise up to date with prevention measures today.  Please keep your appointments with your specialists as you may have planned  Please go to the LAB in the Basement (turn left off the elevator) for the tests to be done today  You will be contacted by phone if any changes need to be made immediately.  Otherwise, you will receive a letter about your results with an explanation, but please check with MyChart first.  Please remember to sign up for MyChart if you have not done so, as this will be important to you in the future with finding out test results, communicating by private email, and scheduling acute appointments online when needed.  Please return in 1 year for your yearly visit, or sooner if needed, with Lab testing done 3-5 days before

## 2015-12-23 NOTE — Progress Notes (Signed)
Pre visit review using our clinic review tool, if applicable. No additional management support is needed unless otherwise documented below in the visit note. 

## 2015-12-26 NOTE — Assessment & Plan Note (Signed)

## 2016-01-15 NOTE — H&P (Signed)
Patient name  Moon, [place] DICTATION# [id number] CSN# [id number]  Darlyn Chamber, MD 01/15/2016 8:42 AM

## 2016-01-16 NOTE — H&P (Signed)
NAMEREYNE, SUMPTION NO.:  [id number]  MEDICAL RECORD NO.:  [id number]  LOCATION:                                 FACILITY:  PHYSICIAN:  Darlyn Chamber, M.D.        DATE OF BIRTH:  DATE OF ADMISSION: DATE OF DISCHARGE:                             HISTORY & PHYSICAL   The date of her surgery is October 30th.  This is going to be done at [facility].  [place]:  The patient is a 45 year old, gravida 3, para 53 female presents for laparoscopic-assisted vaginal hysterectomy with removal of both fallopian tubes.  Her cycles have become progressively heavier with increasing dysmenorrhea.  She has had a previous bilateral tubal ligation.  Saline infusion ultrasound revealed evidence of adenomyosis.  We discussed various options for management, she decided to proceed with the above-noted surgery.  ALLERGIES:  She is allergic to hydrocodone and latex.  MEDICATIONS:  She is on cough medicine and methylphenidate  PAST MEDICAL HISTORY:  She has had usual childhood diseases without significant sequelae.  Does have a history of irritable bowel syndrome.  PAST SURGICAL HISTORY:  Previous operative procedures; she has had 2 cesarean sections, 1 vaginal delivery, and previous bilateral tubal ligation.  SOCIAL HISTORY:  She has no tobacco or alcohol use.  FAMILY HISTORY:  Noncontributory.  REVIEW OF SYSTEMS:  Noncontributory.  PHYSICAL EXAMINATION:  VITAL SIGNS:  The patient is afebrile, stable vital signs. HEENT:  The patient is normocephalic.  Pupils equal, round, and reactive to light and accommodation.  Extraocular movements were intact.  Sclerae and conjunctivae were clear.  Oropharynx was clear. NECK:  Without thyromegaly. BREASTS:  Not examined. LUNGS:  Clear. CARDIOVASCULAR:  Regular rhythm and rate.  No murmurs or gallops.  No carotid or abdominal bruits. ABDOMEN:  Benign.  Well-healed low-transverse  incision. PELVIC:  Normal external genitalia.  Vaginal mucosa is clear.  Her cervix is unremarkable.  Uterus upper limits of normal size.  Adnexa unremarkable. EXTREMITIES:  Trace edema. NEUROLOGIC:  Grossly within normal limits.  IMPRESSION:  Menorrhagia and dysmenorrhea secondary to Adenomyosis.  PLAN:  Options have been discussed.  She decided to proceed with surgical management as noted.  The risks have been explained including the risk of infection.  Risk of hemorrhage that could require transfusion with the risk of AIDS or hepatitis.  Risk of injury to adjacent organs, bladder, bowel ureters that could require further exploratory surgery.  Risk of deep venous thrombosis and pulmonary embolus.  The patient expressed understanding of indications and risks.     Darlyn Chamber, M.D.     JSM/MEDQ  D:  01/15/2016  T:  01/16/2016  Job:  [id number]

## 2016-01-20 ENCOUNTER — Telehealth: Payer: Self-pay | Admitting: Internal Medicine

## 2016-01-20 ENCOUNTER — Ambulatory Visit: Payer: BLUE CROSS/BLUE SHIELD | Admitting: Medical

## 2016-01-20 ENCOUNTER — Emergency Department (HOSPITAL_COMMUNITY)
Admission: EM | Admit: 2016-01-20 | Discharge: 2016-01-20 | Disposition: A | Discharge status: Home / Self Care | Payer: BLUE CROSS/BLUE SHIELD | Attending: Emergency Medicine | Admitting: Emergency Medicine

## 2016-01-20 ENCOUNTER — Encounter (HOSPITAL_COMMUNITY): Payer: Self-pay | Admitting: *Deleted

## 2016-01-20 DIAGNOSIS — R109 Unspecified abdominal pain: Secondary | ICD-10-CM | POA: Diagnosis not present

## 2016-01-20 DIAGNOSIS — F909 Attention-deficit hyperactivity disorder, unspecified type: Secondary | ICD-10-CM | POA: Insufficient documentation

## 2016-01-20 LAB — URINALYSIS, ROUTINE W REFLEX MICROSCOPIC
BILIRUBIN URINE: NEGATIVE
GLUCOSE, UA: NEGATIVE mg/dL
HGB URINE DIPSTICK: NEGATIVE
Ketones, ur: NEGATIVE mg/dL
Leukocytes, UA: NEGATIVE
Nitrite: NEGATIVE
PROTEIN: NEGATIVE mg/dL
Specific Gravity, Urine: 1.022 (ref 1.005–1.030)
pH: 5.5 (ref 5.0–8.0)

## 2016-01-20 LAB — COMPREHENSIVE METABOLIC PANEL
ALK PHOS: 28 U/L — AB (ref 38–126)
ALT: 12 U/L — AB (ref 14–54)
AST: 15 U/L (ref 15–41)
Albumin: 4.1 g/dL (ref 3.5–5.0)
Anion gap: 8 (ref 5–15)
BUN: 12 mg/dL (ref 6–20)
CALCIUM: 9.8 mg/dL (ref 8.9–10.3)
CHLORIDE: 106 mmol/L (ref 101–111)
CO2: 26 mmol/L (ref 22–32)
CREATININE: 0.83 mg/dL (ref 0.44–1.00)
GFR calc Af Amer: >60 mL/min (ref >60)
GFR calc non Af Amer: >60 mL/min (ref >60)
GLUCOSE: 93 mg/dL (ref 65–99)
Potassium: 3.8 mmol/L (ref 3.5–5.1)
SODIUM: 140 mmol/L (ref 135–145)
Total Bilirubin: 0.5 mg/dL (ref 0.3–1.2)
Total Protein: 7 g/dL (ref 6.5–8.1)

## 2016-01-20 LAB — CBC
HCT: 34.1 % — ABNORMAL LOW (ref 36.0–46.0)
Hemoglobin: 10.9 g/dL — ABNORMAL LOW (ref 12.0–15.0)
MCH: 28.1 pg (ref 26.0–34.0)
MCHC: 32 g/dL (ref 30.0–36.0)
MCV: 87.9 fL (ref 78.0–100.0)
PLATELETS: 355 10*3/uL (ref 150–400)
RBC: 3.88 MIL/uL (ref 3.87–5.11)
RDW: 14.4 % (ref 11.5–15.5)
WBC: 4.6 10*3/uL (ref 4.0–10.5)

## 2016-01-20 LAB — LIPASE, BLOOD: LIPASE: 36 U/L (ref 11–51)

## 2016-01-20 LAB — HCG, QUANTITATIVE, PREGNANCY: hCG, Beta Chain, Quant, S: <1 m[IU]/mL (ref <5)

## 2016-01-20 MED ORDER — DICYCLOMINE HCL 10 MG PO CAPS
20.0000 mg | ORAL_CAPSULE | Freq: Once | ORAL | Status: AC
  Administered 2016-01-20: 20 mg via ORAL
  Filled 2016-01-20: qty 2

## 2016-01-20 MED ORDER — ONDANSETRON 4 MG PO TBDP: 4.0000 mg | ORAL_TABLET | Freq: Three times a day (TID) | ORAL | 0 refills | Status: DC | PRN

## 2016-01-20 MED ORDER — KETOROLAC TROMETHAMINE 60 MG/2ML IM SOLN
60.0000 mg | Freq: Once | INTRAMUSCULAR | Status: DC
  Filled 2016-01-20: qty 2

## 2016-01-20 MED ORDER — DICYCLOMINE HCL 20 MG PO TABS: 20.0000 mg | ORAL_TABLET | Freq: Two times a day (BID) | ORAL | 0 refills | Status: DC

## 2016-01-20 MED ORDER — ONDANSETRON 4 MG PO TBDP
4.0000 mg | ORAL_TABLET | Freq: Once | ORAL | Status: AC
  Administered 2016-01-20: 4 mg via ORAL
  Filled 2016-01-20: qty 1

## 2016-01-20 NOTE — Telephone Encounter (Signed)
Patient Name: Kellie Curtis  DOB: [birth year]    Initial Comment After eating deli chicken from [employer] she started having stomach pain. Still having nausea    Nurse Assessment      Guidelines    Guideline Title Affirmed Question Affirmed Notes       Final Disposition User   FINAL ATTEMPT MADE - no message left [place], RN, [employer]

## 2016-01-20 NOTE — Discharge Instructions (Signed)
Take the prescribed medication as directed. Recommend fluids at home to stay hydrated. Start with gentle diet and progress back to normal as tolerated. Follow-up with your primary care doctor. Return to the ED for new or worsening symptoms.

## 2016-01-20 NOTE — ED Notes (Signed)
Pt informed that she has been placed into a room and that staff would be here to get her shortly.

## 2016-01-20 NOTE — ED Provider Notes (Signed)
[facility] DEPT Provider Note   CSN: [id number] Arrival date & time: 01/20/16  1355     History   Chief Complaint Chief Complaint  Patient presents with  . Abdominal Pain    HPI Kellie Curtis is a 45 y.o. female.  The history is provided by the patient and medical records.    44 year old female with history of anemia, anxiety, depression, IBS, seizure disorder, presenting to the ED for abdominal pain. Patient states last night she picked up some from [place] on her way home. States she was eating them at home and when she got to the last 2, she states they tasted "spoiled". States she did not finish eating them. States she went to bed but woke up in the middle of the night with some abdominal cramping and "gassy feeling". States around noon today she developed some nausea and has had several episodes of nonbloody, nonbilious emesis. She's not had any fever or chills. Reports some "cramping" abdominal pain, however reports she is currently on her cycle as well, not sure if this is contributing. Did have a BM today which was normal.  Able to pass flatus.  Does report history of IBS. Saw a GI doctor for this in the past, no recent issues until now.  Past Medical History:  Diagnosis Date  . Abdominal pain, epigastric   . Abdominal pain, left lower quadrant   . Abdominal pain, left upper quadrant   . Abdominal pain, left upper quadrant   . Acute sinusitis, unspecified   . Anemia   . Anxiety state, unspecified   . Attention deficit disorder with hyperactivity(314.01)   . Backache, unspecified   . Chest pain, unspecified   . Chest pain, unspecified   . Depression 10/05/2012  . Depressive disorder, not elsewhere classified   . Dyslexia   . Elevated blood pressure reading without diagnosis of hypertension   . Fever, unspecified   . Hiatal hernia   . IBS (irritable bowel syndrome)   . Learning disorder   . Other and unspecified hyperlipidemia   . Other convulsions   . Personal  history of colonic polyps 05/12/2010   TUBULAR ADENOMA  . Seizure disorder ([place])   . Unspecified constipation   . Wheezing     Patient Active Problem List   Diagnosis Date Noted  . Perimenopause 06/06/2014  . Thickened endometrium 06/06/2014  . Low grade squamous intraepithelial lesion (LGSIL) on Papanicolaou smear of cervix 05/25/2014  . Hemoptysis 05/10/2013  . Acute bronchitis 01/11/2013  . Chest pain 01/11/2013  . Anemia, unspecified 10/04/2012  . Allergic rhinitis 03/10/2012  . Irregular menses 05/30/2011  . Irritable bowel syndrome with constipation 05/30/2011  . Preventative health care 05/28/2011  . GERD 05/25/2010  . HIATAL HERNIA WITH REFLUX 05/21/2010  . COLONIC POLYPS, HX OF 05/21/2010  . CONSTIPATION 04/27/2010  . ANXIETY 03/14/2008  . Depression 03/14/2008  . ADHD 03/14/2008  . SEIZURE DISORDER 02/13/2008  . HYPERLIPIDEMIA 02/07/2008  . Acute sinus infection 02/07/2008  . BACK PAIN 02/07/2008    Past Surgical History:  Procedure Laterality Date  . CESAREAN SECTION     X 2  . TONSILLECTOMY    . TUBAL LIGATION      OB History    Gravida Para Term Preterm AB Living   3 3       3    SAB TAB Ectopic Multiple Live Births                   Home Medications  Prior to Admission medications   Medication Sig Start Date End Date Taking? Authorizing Provider  methylphenidate (METADATE CD) 40 MG CR capsule Take 1 capsule (40 mg total) by mouth every morning. To fill Feb 21, 2016 Patient taking differently: Take 40 mg by mouth daily as needed. To fill Feb 21, 2016 12/23/15  Yes Biagio Borg, MD  naproxen (NAPROSYN) 500 MG tablet Take 1 tablet (500 mg total) by mouth 2 (two) times daily. Patient not taking: Reported on 01/20/2016 06/29/15   Carlisle Cater, PA-C    Family History Family History  Problem Relation Age of Onset  . Stroke Father   . Hypertension Father   . Gout Father   . Hyperlipidemia Father   . Colon cancer Father   . Asthma Mother   .  Alcohol abuse      grandfather  . Breast cancer      grandmother  . Hypertension      grandmother  . Asthma      grandmother  . Breast cancer Maternal Grandmother   . Esophageal cancer Neg Hx   . Stomach cancer Neg Hx   . Rectal cancer Neg Hx     Social History Social History  Substance Use Topics  . Smoking status: Never Smoker  . Smokeless tobacco: Never Used  . Alcohol use 1.2 oz/week    2 Cans of beer per week     Comment: per week     Allergies   Adhesive [tape] and Hydrocodone-homatropine   Review of Systems Review of Systems  Gastrointestinal: Positive for abdominal pain, nausea and vomiting.  All other systems reviewed and are negative.    Physical Exam Updated Vital Signs BP 124/73 (BP Location: Left Arm)   Pulse 66   Temp 97.5 F (36.4 C) (Oral)   Resp 18   LMP 01/20/2016   SpO2 100%   Physical Exam  Constitutional: She is oriented to person, place, and time. She appears well-developed and well-nourished.  HENT:  Head: Normocephalic and atraumatic.  Mouth/Throat: Oropharynx is clear and moist.  Mucous membranes moist  Eyes: Conjunctivae and EOM are normal. Pupils are equal, round, and reactive to light.  Neck: Normal range of motion.  Cardiovascular: Normal rate, regular rhythm and normal heart sounds.   Pulmonary/Chest: Effort normal and breath sounds normal.  Abdominal: Soft. Bowel sounds are normal. There is no tenderness. There is no rigidity and no guarding.  Abdomen soft, nondistended, no focal tenderness or peritonitis Normal bowel sounds  Musculoskeletal: Normal range of motion.  Neurological: She is alert and oriented to person, place, and time.  Skin: Skin is warm and dry.  Psychiatric: She has a normal mood and affect.  Nursing note and vitals reviewed.    ED Treatments / Results  Labs (all labs ordered are listed, but only abnormal results are displayed) Labs Reviewed  COMPREHENSIVE METABOLIC PANEL - Abnormal; Notable for  the following:       Result Value   ALT 12 (*)    Alkaline Phosphatase 28 (*)    All other components within normal limits  CBC - Abnormal; Notable for the following:    Hemoglobin 10.9 (*)    HCT 34.1 (*)    All other components within normal limits  URINALYSIS, ROUTINE W REFLEX MICROSCOPIC (NOT AT [facility]) - Abnormal; Notable for the following:    APPearance HAZY (*)    All other components within normal limits  LIPASE, BLOOD  HCG, QUANTITATIVE, PREGNANCY    EKG  EKG Interpretation None       Radiology No results found.  Procedures Procedures (including critical care time)  Medications Ordered in ED Medications  ketorolac (TORADOL) injection 60 mg (60 mg Intramuscular Refused 01/20/16 1840)  dicyclomine (BENTYL) capsule 20 mg (20 mg Oral Given 01/20/16 1839)  ondansetron (ZOFRAN-ODT) disintegrating tablet 4 mg (4 mg Oral Given 01/20/16 1839)     Initial Impression / Assessment and Plan / ED Course  I have reviewed the triage vital signs and the nursing notes.  Pertinent labs & imaging results that were available during my care of the patient were reviewed by me and considered in my medical decision making (see chart for details).  Clinical Course   45 year old female who with abdominal pain, nausea, and vomiting after eating chicken last night which she reports tasted "spoiled". She is afebrile and nontoxic. Her abdomen is soft and benign. Mucous membranes are moist, does not appear clinically dehydrated. She does endorse some cramping pain, currently on her menstrual as well. Labwork is reassuring. Patient treated symptomatically here with Bentyl and Zofran. No narcotics given as she drove herself to the ED today.  Offered toradol, patient refused.  7:28 PM After meds here, improvement of symptoms.  Tolerated oral fluids without difficulty.  No emesis while here in ED.  VSS.  Abdomen remains soft, benign.  Patient appears stable for discharge with symptomatic care.  Follow-up with PCP. Encouraged gentle diet for the next few days, progress back to normal as tolerated. Fluids to stay hydrated.  Discussed plan with patient, she acknowledged understanding and agreed with plan of care.  Return precautions given for new or worsening symptoms.  Final Clinical Impressions(s) / ED Diagnoses   Final diagnoses:  Abdominal pain, unspecified abdominal location    New Prescriptions New Prescriptions   DICYCLOMINE (BENTYL) 20 MG TABLET    Take 1 tablet (20 mg total) by mouth 2 (two) times daily.   ONDANSETRON (ZOFRAN ODT) 4 MG DISINTEGRATING TABLET    Take 1 tablet (4 mg total) by mouth every 8 (eight) hours as needed for nausea.     Larene Pickett, PA-C 01/20/16 2008    Duffy Bruce, MD 01/21/16 1500

## 2016-01-20 NOTE — ED Notes (Signed)
Patient verbalized understanding of discharge instructions and denies any further needs or questions at this time. VS stable. Patient ambulatory with steady gait.  

## 2016-01-20 NOTE — ED Triage Notes (Signed)
Pt reports abdominal pain, n/v since yesterday. Pt states that she has attempted to eat but it made pain worse. Pt believes that it was something that she ate yesterday.

## 2016-01-26 ENCOUNTER — Encounter (HOSPITAL_BASED_OUTPATIENT_CLINIC_OR_DEPARTMENT_OTHER): Payer: Self-pay | Admitting: *Deleted

## 2016-01-29 NOTE — Progress Notes (Signed)
UNABLE TO REACH AFTER MULTIPLE ATTEMPTS SINCE Tuesday 01-26-2016.  SPOKE [place] , OR SCHEDULER FOR DR MCCOMB , AWARE UNABLE TO REACH PT , HAD ALREADY VERIFIED AND HAD CORRECT NUMBER.  MARY AT OFFICE STATED SHE HAD BEEN UNABLE TO REACH PT.  PT DID HAVE CURRENT LAB WORK DONE IN EPIC (CBC, CMET, SERUM PREG.) AND CBC WAS DONE AND FAXED FROM OFFICE YESTERDAY .  PT WILL NEED T&S ON ARRIVAL AND HISTORY DONE IN EPIC.

## 2016-02-01 ENCOUNTER — Ambulatory Visit (HOSPITAL_BASED_OUTPATIENT_CLINIC_OR_DEPARTMENT_OTHER): Payer: BLUE CROSS/BLUE SHIELD | Admitting: Anesthesiology

## 2016-02-01 ENCOUNTER — Observation Stay (HOSPITAL_BASED_OUTPATIENT_CLINIC_OR_DEPARTMENT_OTHER)
Admission: RE | Admit: 2016-02-01 | Discharge: 2016-02-02 | Disposition: A | Discharge status: Home / Self Care | Payer: BLUE CROSS/BLUE SHIELD | Source: Ambulatory Visit | Attending: Obstetrics and Gynecology | Admitting: Obstetrics and Gynecology

## 2016-02-01 ENCOUNTER — Encounter (HOSPITAL_BASED_OUTPATIENT_CLINIC_OR_DEPARTMENT_OTHER): Payer: Self-pay

## 2016-02-01 ENCOUNTER — Encounter (HOSPITAL_COMMUNITY)
Admission: RE | Disposition: A | Discharge status: Home / Self Care | Payer: Self-pay | Source: Ambulatory Visit | Attending: Obstetrics and Gynecology

## 2016-02-01 DIAGNOSIS — K219 Gastro-esophageal reflux disease without esophagitis: Secondary | ICD-10-CM | POA: Insufficient documentation

## 2016-02-01 DIAGNOSIS — Z6831 Body mass index (BMI) 31.0-31.9, adult: Secondary | ICD-10-CM | POA: Diagnosis not present

## 2016-02-01 DIAGNOSIS — F329 Major depressive disorder, single episode, unspecified: Secondary | ICD-10-CM | POA: Insufficient documentation

## 2016-02-01 DIAGNOSIS — K589 Irritable bowel syndrome without diarrhea: Secondary | ICD-10-CM | POA: Insufficient documentation

## 2016-02-01 DIAGNOSIS — N800 Endometriosis of uterus: Secondary | ICD-10-CM | POA: Diagnosis present

## 2016-02-01 DIAGNOSIS — D649 Anemia, unspecified: Secondary | ICD-10-CM | POA: Diagnosis not present

## 2016-02-01 DIAGNOSIS — F419 Anxiety disorder, unspecified: Secondary | ICD-10-CM | POA: Insufficient documentation

## 2016-02-01 DIAGNOSIS — Z91040 Latex allergy status: Secondary | ICD-10-CM | POA: Diagnosis not present

## 2016-02-01 DIAGNOSIS — J45909 Unspecified asthma, uncomplicated: Secondary | ICD-10-CM | POA: Insufficient documentation

## 2016-02-01 DIAGNOSIS — N946 Dysmenorrhea, unspecified: Secondary | ICD-10-CM | POA: Insufficient documentation

## 2016-02-01 DIAGNOSIS — D251 Intramural leiomyoma of uterus: Secondary | ICD-10-CM | POA: Insufficient documentation

## 2016-02-01 DIAGNOSIS — E669 Obesity, unspecified: Secondary | ICD-10-CM | POA: Insufficient documentation

## 2016-02-01 DIAGNOSIS — G709 Myoneural disorder, unspecified: Secondary | ICD-10-CM | POA: Insufficient documentation

## 2016-02-01 DIAGNOSIS — Z885 Allergy status to narcotic agent status: Secondary | ICD-10-CM | POA: Insufficient documentation

## 2016-02-01 DIAGNOSIS — N938 Other specified abnormal uterine and vaginal bleeding: Secondary | ICD-10-CM | POA: Insufficient documentation

## 2016-02-01 DIAGNOSIS — Z90710 Acquired absence of both cervix and uterus: Secondary | ICD-10-CM | POA: Diagnosis present

## 2016-02-01 HISTORY — DX: Personal history of adenomatous and serrated colon polyps: Z86.0101

## 2016-02-01 HISTORY — DX: Excessive and frequent menstruation with regular cycle: N92.0

## 2016-02-01 HISTORY — DX: Unspecified asthma, uncomplicated: J45.909

## 2016-02-01 HISTORY — DX: Allergic rhinitis, unspecified: J30.9

## 2016-02-01 HISTORY — PX: LAPAROSCOPIC VAGINAL HYSTERECTOMY WITH SALPINGECTOMY: SHX6680

## 2016-02-01 HISTORY — DX: Adenomyosis of the uterus: N80.03

## 2016-02-01 HISTORY — DX: Dysmenorrhea, unspecified: N94.6

## 2016-02-01 HISTORY — DX: Attention-deficit hyperactivity disorder, unspecified type: F90.9

## 2016-02-01 HISTORY — DX: Hyperlipidemia, unspecified: E78.5

## 2016-02-01 HISTORY — DX: Gastro-esophageal reflux disease without esophagitis: K21.9

## 2016-02-01 HISTORY — DX: Personal history of colonic polyps: Z86.010

## 2016-02-01 HISTORY — DX: Endometriosis of uterus: N80.0

## 2016-02-01 HISTORY — DX: Endometriosis of the uterus, unspecified: N80.00

## 2016-02-01 LAB — CBC
HEMATOCRIT: 34.4 % — AB (ref 36.0–46.0)
HEMOGLOBIN: 10.9 g/dL — AB (ref 12.0–15.0)
MCH: 27.9 pg (ref 26.0–34.0)
MCHC: 31.7 g/dL (ref 30.0–36.0)
MCV: 88.2 fL (ref 78.0–100.0)
Platelets: 343 10*3/uL (ref 150–400)
RBC: 3.9 MIL/uL (ref 3.87–5.11)
RDW: 14.1 % (ref 11.5–15.5)
WBC: 4.6 10*3/uL (ref 4.0–10.5)

## 2016-02-01 LAB — HCG, SERUM, QUALITATIVE: PREG SERUM: NEGATIVE

## 2016-02-01 LAB — TYPE AND SCREEN
ABO/RH(D): A POS
ANTIBODY SCREEN: NEGATIVE

## 2016-02-01 LAB — ABO/RH: ABO/RH(D): A POS

## 2016-02-01 SURGERY — HYSTERECTOMY, VAGINAL, LAPAROSCOPY-ASSISTED, WITH SALPINGECTOMY
Anesthesia: General | Site: Abdomen | Laterality: Bilateral

## 2016-02-01 MED ORDER — DIPHENHYDRAMINE HCL 12.5 MG/5ML PO ELIX: 12.5000 mg | ORAL_SOLUTION | Freq: Four times a day (QID) | ORAL | Status: DC | PRN

## 2016-02-01 MED ORDER — ROCURONIUM BROMIDE 50 MG/5ML IV SOSY
PREFILLED_SYRINGE | INTRAVENOUS | Status: AC
  Filled 2016-02-01: qty 5

## 2016-02-01 MED ORDER — NALOXONE HCL 0.4 MG/ML IJ SOLN
0.4000 mg | INTRAMUSCULAR | Status: DC | PRN
  Filled 2016-02-01: qty 1

## 2016-02-01 MED ORDER — MENTHOL 3 MG MT LOZG
1.0000 | LOZENGE | OROMUCOSAL | Status: DC | PRN
  Filled 2016-02-01: qty 9

## 2016-02-01 MED ORDER — ONDANSETRON HCL 4 MG/2ML IJ SOLN
4.0000 mg | Freq: Four times a day (QID) | INTRAMUSCULAR | Status: DC | PRN
  Filled 2016-02-01: qty 2

## 2016-02-01 MED ORDER — CEFAZOLIN SODIUM-DEXTROSE 2-4 GM/100ML-% IV SOLN
2.0000 g | INTRAVENOUS | Status: AC
  Administered 2016-02-01: 2 g via INTRAVENOUS
  Filled 2016-02-01: qty 100

## 2016-02-01 MED ORDER — METOCLOPRAMIDE HCL 5 MG/ML IJ SOLN
INTRAMUSCULAR | Status: AC
  Filled 2016-02-01: qty 2

## 2016-02-01 MED ORDER — PROPOFOL 10 MG/ML IV BOLUS
INTRAVENOUS | Status: AC
Start: 2016-02-01 — End: 2016-02-01
  Filled 2016-02-01: qty 40

## 2016-02-01 MED ORDER — ACETAMINOPHEN 325 MG PO TABS
650.0000 mg | ORAL_TABLET | ORAL | Status: DC | PRN
  Filled 2016-02-01: qty 2

## 2016-02-01 MED ORDER — ACETAMINOPHEN 325 MG PO TABS
ORAL_TABLET | ORAL | Status: DC | PRN
  Administered 2016-02-01: 1000 mg via ORAL

## 2016-02-01 MED ORDER — LIDOCAINE HCL 4 % MT SOLN
OROMUCOSAL | Status: DC | PRN
  Administered 2016-02-01: 2 mL via TOPICAL

## 2016-02-01 MED ORDER — ACETAMINOPHEN 500 MG PO TABS
ORAL_TABLET | ORAL | Status: AC
  Filled 2016-02-01: qty 2

## 2016-02-01 MED ORDER — LIDOCAINE 2% (20 MG/ML) 5 ML SYRINGE
INTRAMUSCULAR | Status: AC
  Filled 2016-02-01: qty 5

## 2016-02-01 MED ORDER — EPHEDRINE 5 MG/ML INJ
INTRAVENOUS | Status: AC
  Filled 2016-02-01: qty 10

## 2016-02-01 MED ORDER — ONDANSETRON HCL 4 MG PO TABS
4.0000 mg | ORAL_TABLET | Freq: Four times a day (QID) | ORAL | Status: DC | PRN
  Filled 2016-02-01: qty 1

## 2016-02-01 MED ORDER — FENTANYL CITRATE (PF) 100 MCG/2ML IJ SOLN
INTRAMUSCULAR | Status: DC | PRN
  Administered 2016-02-01 (×2): 50 ug via INTRAVENOUS
  Administered 2016-02-01: 100 ug via INTRAVENOUS
  Administered 2016-02-01 (×2): 50 ug via INTRAVENOUS

## 2016-02-01 MED ORDER — DEXAMETHASONE SODIUM PHOSPHATE 10 MG/ML IJ SOLN
INTRAMUSCULAR | Status: AC
  Filled 2016-02-01: qty 1

## 2016-02-01 MED ORDER — FENTANYL CITRATE (PF) 100 MCG/2ML IJ SOLN
INTRAMUSCULAR | Status: AC
  Filled 2016-02-01: qty 2

## 2016-02-01 MED ORDER — SUGAMMADEX SODIUM 200 MG/2ML IV SOLN
INTRAVENOUS | Status: DC | PRN
  Administered 2016-02-01: 200 mg via INTRAVENOUS

## 2016-02-01 MED ORDER — CEFAZOLIN SODIUM-DEXTROSE 2-4 GM/100ML-% IV SOLN
INTRAVENOUS | Status: AC
  Filled 2016-02-01: qty 100

## 2016-02-01 MED ORDER — KETOROLAC TROMETHAMINE 30 MG/ML IJ SOLN
INTRAMUSCULAR | Status: DC | PRN
  Administered 2016-02-01: 30 mg via INTRAVENOUS

## 2016-02-01 MED ORDER — LIDOCAINE HCL (CARDIAC) 20 MG/ML IV SOLN
INTRAVENOUS | Status: DC | PRN
  Administered 2016-02-01: 60 mg via INTRAVENOUS

## 2016-02-01 MED ORDER — PROMETHAZINE HCL 25 MG/ML IJ SOLN
6.2500 mg | INTRAMUSCULAR | Status: DC | PRN
  Filled 2016-02-01: qty 1

## 2016-02-01 MED ORDER — SUCCINYLCHOLINE CHLORIDE 20 MG/ML IJ SOLN
INTRAMUSCULAR | Status: AC
  Filled 2016-02-01: qty 1

## 2016-02-01 MED ORDER — ARTIFICIAL TEARS OP OINT
TOPICAL_OINTMENT | OPHTHALMIC | Status: AC
  Filled 2016-02-01: qty 3.5

## 2016-02-01 MED ORDER — ONDANSETRON HCL 4 MG/2ML IJ SOLN: 4.0000 mg | Freq: Four times a day (QID) | INTRAMUSCULAR | Status: DC | PRN

## 2016-02-01 MED ORDER — ONDANSETRON HCL 4 MG/2ML IJ SOLN
INTRAMUSCULAR | Status: DC | PRN
  Administered 2016-02-01: 4 mg via INTRAVENOUS

## 2016-02-01 MED ORDER — DIPHENHYDRAMINE HCL 50 MG/ML IJ SOLN
12.5000 mg | Freq: Four times a day (QID) | INTRAMUSCULAR | Status: DC | PRN
  Filled 2016-02-01: qty 0.25

## 2016-02-01 MED ORDER — ONDANSETRON HCL 4 MG/2ML IJ SOLN
4.0000 mg | Freq: Four times a day (QID) | INTRAMUSCULAR | Status: DC | PRN
  Administered 2016-02-01: 4 mg via INTRAVENOUS
  Filled 2016-02-01 (×2): qty 2

## 2016-02-01 MED ORDER — BUPIVACAINE HCL (PF) 0.25 % IJ SOLN
INTRAMUSCULAR | Status: DC | PRN
  Administered 2016-02-01: 7 mL

## 2016-02-01 MED ORDER — ROCURONIUM BROMIDE 50 MG/5ML IV SOSY
PREFILLED_SYRINGE | INTRAVENOUS | Status: DC | PRN
  Administered 2016-02-01: 50 mg via INTRAVENOUS

## 2016-02-01 MED ORDER — FENTANYL CITRATE (PF) 100 MCG/2ML IJ SOLN
25.0000 ug | INTRAMUSCULAR | Status: DC | PRN
  Administered 2016-02-01: 50 ug via INTRAVENOUS
  Filled 2016-02-01: qty 1

## 2016-02-01 MED ORDER — EPHEDRINE SULFATE 50 MG/ML IJ SOLN
INTRAMUSCULAR | Status: DC | PRN
  Administered 2016-02-01 (×3): 10 mg via INTRAVENOUS

## 2016-02-01 MED ORDER — SODIUM CHLORIDE 0.9% FLUSH
9.0000 mL | INTRAVENOUS | Status: DC | PRN
  Filled 2016-02-01: qty 10

## 2016-02-01 MED ORDER — LACTATED RINGERS IV SOLN
INTRAVENOUS | Status: DC
  Administered 2016-02-01 (×2): via INTRAVENOUS
  Filled 2016-02-01: qty 1000

## 2016-02-01 MED ORDER — ONDANSETRON HCL 4 MG/2ML IJ SOLN
INTRAMUSCULAR | Status: AC
  Filled 2016-02-01: qty 2

## 2016-02-01 MED ORDER — METOCLOPRAMIDE HCL 5 MG/ML IJ SOLN
INTRAMUSCULAR | Status: DC | PRN
  Administered 2016-02-01: 10 mg via INTRAVENOUS

## 2016-02-01 MED ORDER — MIDAZOLAM HCL 2 MG/2ML IJ SOLN
INTRAMUSCULAR | Status: AC
  Filled 2016-02-01: qty 2

## 2016-02-01 MED ORDER — DIPHENHYDRAMINE HCL 50 MG/ML IJ SOLN: 12.5000 mg | Freq: Four times a day (QID) | INTRAMUSCULAR | Status: DC | PRN

## 2016-02-01 MED ORDER — HYDROMORPHONE HCL 2 MG PO TABS
2.0000 mg | ORAL_TABLET | ORAL | Status: DC | PRN
  Administered 2016-02-01: 2 mg via ORAL
  Filled 2016-02-01 (×2): qty 1

## 2016-02-01 MED ORDER — DIPHENHYDRAMINE HCL 12.5 MG/5ML PO ELIX
12.5000 mg | ORAL_SOLUTION | Freq: Four times a day (QID) | ORAL | Status: DC | PRN
  Filled 2016-02-01: qty 5

## 2016-02-01 MED ORDER — SODIUM CHLORIDE 0.9% FLUSH: 9.0000 mL | INTRAVENOUS | Status: DC | PRN

## 2016-02-01 MED ORDER — LACTATED RINGERS IR SOLN
Status: DC | PRN
  Administered 2016-02-01: 3000 mL

## 2016-02-01 MED ORDER — DEXAMETHASONE SODIUM PHOSPHATE 4 MG/ML IJ SOLN
INTRAMUSCULAR | Status: DC | PRN
  Administered 2016-02-01: 10 mg via INTRAVENOUS

## 2016-02-01 MED ORDER — HYDROMORPHONE 1 MG/ML IV SOLN
INTRAVENOUS | Status: DC
  Administered 2016-02-01: 0.6 mg via INTRAVENOUS
  Administered 2016-02-01: 18:00:00 via INTRAVENOUS
  Administered 2016-02-02: 0 mg via INTRAVENOUS
  Administered 2016-02-02: 0.3 mg via INTRAVENOUS
  Filled 2016-02-01: qty 25

## 2016-02-01 MED ORDER — PROPOFOL 10 MG/ML IV BOLUS
INTRAVENOUS | Status: DC | PRN
  Administered 2016-02-01: 150 mg via INTRAVENOUS

## 2016-02-01 MED ORDER — HYDROMORPHONE 1 MG/ML IV SOLN
INTRAVENOUS | Status: DC
  Filled 2016-02-01: qty 25

## 2016-02-01 MED ORDER — MIDAZOLAM HCL 5 MG/5ML IJ SOLN
INTRAMUSCULAR | Status: DC | PRN
  Administered 2016-02-01: 2 mg via INTRAVENOUS

## 2016-02-01 MED ORDER — FENTANYL CITRATE (PF) 100 MCG/2ML IJ SOLN
INTRAMUSCULAR | Status: AC
  Filled 2016-02-01: qty 4

## 2016-02-01 MED ORDER — NALOXONE HCL 0.4 MG/ML IJ SOLN: 0.4000 mg | INTRAMUSCULAR | Status: DC | PRN

## 2016-02-01 MED ORDER — SUGAMMADEX SODIUM 200 MG/2ML IV SOLN
INTRAVENOUS | Status: AC
  Filled 2016-02-01: qty 2

## 2016-02-01 MED ORDER — KETOROLAC TROMETHAMINE 30 MG/ML IJ SOLN
INTRAMUSCULAR | Status: AC
  Filled 2016-02-01: qty 1

## 2016-02-01 SURGICAL SUPPLY — 87 items
APPLICATOR COTTON TIP 6IN STRL (MISCELLANEOUS) ×3 IMPLANT
BAG SPEC RTRVL LRG 6X4 10 (ENDOMECHANICALS)
BANDAGE ADH SHEER 1  50/CT (GAUZE/BANDAGES/DRESSINGS) ×1 IMPLANT
BANDAGE ADHESIVE 1X3 (GAUZE/BANDAGES/DRESSINGS) IMPLANT
BLADE CLIPPER SURG (BLADE) ×3 IMPLANT
BLADE SURG 10 STRL SS (BLADE) ×3 IMPLANT
BLADE SURG 11 STRL SS (BLADE) ×3 IMPLANT
CANISTER SUCTION 2500CC (MISCELLANEOUS) ×6 IMPLANT
CATH ROBINSON RED A/P 16FR (CATHETERS) ×1 IMPLANT
CATH SILICONE 16FRX5CC (CATHETERS) ×4 IMPLANT
CLOSURE WOUND 1/4X4 (GAUZE/BANDAGES/DRESSINGS)
COVER BACK TABLE 60X90IN (DRAPES) ×6 IMPLANT
COVER MAYO STAND STRL (DRAPES) ×6 IMPLANT
DRAPE LG THREE QUARTER DISP (DRAPES) ×3 IMPLANT
DRAPE UNDERBUTTOCKS STRL (DRAPE) ×3 IMPLANT
ELECT REM PT RETURN 9FT ADLT (ELECTROSURGICAL) ×3
ELECTRODE REM PT RTRN 9FT ADLT (ELECTROSURGICAL) ×1 IMPLANT
FILTER SMOKE EVAC LAPAROSHD (FILTER) IMPLANT
GLOVE BIO SURGEON STRL SZ7 (GLOVE) ×4 IMPLANT
GLOVE BIOGEL PI IND STRL 6.5 (GLOVE) ×1 IMPLANT
GLOVE BIOGEL PI IND STRL 7.0 (GLOVE) IMPLANT
GLOVE BIOGEL PI IND STRL 7.5 (GLOVE) IMPLANT
GLOVE BIOGEL PI INDICATOR 6.5 (GLOVE) ×6
GLOVE BIOGEL PI INDICATOR 7.0 (GLOVE) ×4
GLOVE BIOGEL PI INDICATOR 7.5 (GLOVE) ×4
GLOVE SURG SS PI 7.0 STRL IVOR (GLOVE) ×8 IMPLANT
GOWN STRL REUS W/ TWL LRG LVL3 (GOWN DISPOSABLE) ×1 IMPLANT
GOWN STRL REUS W/ TWL XL LVL3 (GOWN DISPOSABLE) ×1 IMPLANT
GOWN STRL REUS W/TWL LRG LVL3 (GOWN DISPOSABLE) ×6
GOWN STRL REUS W/TWL XL LVL3 (GOWN DISPOSABLE) ×6
HOLDER FOLEY CATH W/STRAP (MISCELLANEOUS) ×3 IMPLANT
KIT ROOM TURNOVER WOR (KITS) ×3 IMPLANT
LIQUID BAND (GAUZE/BANDAGES/DRESSINGS) ×3 IMPLANT
NDL INSUFFLATION 14GA 120MM (NEEDLE) IMPLANT
NDL INSUFFLATION 14GA 150MM (NEEDLE) IMPLANT
NEEDLE HYPO 22GX1.5 SAFETY (NEEDLE) ×3 IMPLANT
NEEDLE INSUFFLATION 14GA 120MM (NEEDLE) IMPLANT
NEEDLE INSUFFLATION 14GA 150MM (NEEDLE) IMPLANT
NS IRRIG 500ML POUR BTL (IV SOLUTION) ×3 IMPLANT
PACK BASIN DAY SURGERY FS (CUSTOM PROCEDURE TRAY) ×3 IMPLANT
PAD ION LEFT ARM DISP (MISCELLANEOUS) ×2 IMPLANT
PAD OB MATERNITY 4.3X12.25 (PERSONAL CARE ITEMS) ×3 IMPLANT
PAD PREP 24X48 CUFFED NSTRL (MISCELLANEOUS) ×3 IMPLANT
PADDING ION DISPOSABLE (MISCELLANEOUS) ×3 IMPLANT
PENCIL BUTTON HOLSTER BLD 10FT (ELECTRODE) ×3 IMPLANT
POUCH SPECIMEN RETRIEVAL 10MM (ENDOMECHANICALS) IMPLANT
SCISSORS LAP 5X35 DISP (ENDOMECHANICALS) IMPLANT
SCISSORS LAP 5X45 EPIX DISP (ENDOMECHANICALS) IMPLANT
SEALER TISSUE G2 CVD JAW 45CM (ENDOMECHANICALS) ×3 IMPLANT
SET IRRIG TUBING LAPAROSCOPIC (IRRIGATION / IRRIGATOR) ×3 IMPLANT
SET IRRIG Y TYPE TUR BLADDER L (SET/KITS/TRAYS/PACK) IMPLANT
SHEET LAVH (DRAPES) ×3 IMPLANT
SOLUTION ANTI FOG 6CC (MISCELLANEOUS) ×3 IMPLANT
SOLUTION ELECTROLUBE (MISCELLANEOUS) IMPLANT
SPONGE GAUZE 2X2 8PLY STER LF (GAUZE/BANDAGES/DRESSINGS)
SPONGE GAUZE 2X2 8PLY STRL LF (GAUZE/BANDAGES/DRESSINGS) IMPLANT
SPONGE LAP 4X18 X RAY DECT (DISPOSABLE) ×3 IMPLANT
STRIP CLOSURE SKIN 1/4X4 (GAUZE/BANDAGES/DRESSINGS) IMPLANT
SUT MNCRL AB 4-0 PS2 18 (SUTURE) ×3 IMPLANT
SUT MON AB 2-0 CT1 36 (SUTURE) ×3 IMPLANT
SUT VIC AB 0 CT1 18XCR BRD8 (SUTURE) ×2 IMPLANT
SUT VIC AB 0 CT1 36 (SUTURE) ×6 IMPLANT
SUT VIC AB 0 CT1 8-18 (SUTURE) ×6
SUT VIC AB 2-0 CT1 (SUTURE) IMPLANT
SUT VIC AB 2-0 SH 27 (SUTURE)
SUT VIC AB 2-0 SH 27XBRD (SUTURE) IMPLANT
SUT VIC AB 3-0 SH 27 (SUTURE)
SUT VIC AB 3-0 SH 27X BRD (SUTURE) IMPLANT
SUT VICRYL 0 UR6 27IN ABS (SUTURE) ×2 IMPLANT
SUT VICRYL 1 TIES 12X18 (SUTURE) ×3 IMPLANT
SUT VICRYL 4-0 PS2 18IN ABS (SUTURE) ×3 IMPLANT
SYR BULB IRRIGATION 50ML (SYRINGE) ×3 IMPLANT
SYR CONTROL 10ML LL (SYRINGE) ×3 IMPLANT
SYRINGE 10CC LL (SYRINGE) ×3 IMPLANT
TOWEL OR 17X24 6PK STRL BLUE (TOWEL DISPOSABLE) ×6 IMPLANT
TRAY DSU PREP LF (CUSTOM PROCEDURE TRAY) ×3 IMPLANT
TRAY FOLEY CATH SILVER 14FR (SET/KITS/TRAYS/PACK) ×1 IMPLANT
TROCAR BALLN 12MMX100 BLUNT (TROCAR) ×2 IMPLANT
TROCAR OPTI TIP 5M 100M (ENDOMECHANICALS) ×3 IMPLANT
TROCAR XCEL 12X100 BLDLESS (ENDOMECHANICALS) IMPLANT
TROCAR XCEL DIL TIP R 11M (ENDOMECHANICALS) IMPLANT
TUBE CONNECTING 12'X1/4 (SUCTIONS) ×2
TUBE CONNECTING 12X1/4 (SUCTIONS) ×4 IMPLANT
TUBING INSUFFLATION 10FT LAP (TUBING) ×3 IMPLANT
WARMER LAPAROSCOPE (MISCELLANEOUS) ×3 IMPLANT
WATER STERILE IRR 500ML POUR (IV SOLUTION) ×3 IMPLANT
YANKAUER SUCT BULB TIP NO VENT (SUCTIONS) ×3 IMPLANT

## 2016-02-01 NOTE — Transfer of Care (Signed)
Last Vitals:  Vitals:   02/01/16 0928 02/01/16 0930  BP: 122/72 122/72  Pulse:  98  Resp: 15 16  Temp: 36.9 C     Last Pain:  Vitals:   02/01/16 0930  TempSrc:   PainSc: 0-No pain         Immediate Anesthesia Transfer of Care Note  Patient: Kellie Curtis  Procedure(s) Performed: Procedure(s) (LRB): LAPAROSCOPIC ASSISTED VAGINAL HYSTERECTOMY WITH SALPINGECTOMY (Bilateral)  Patient Location: PACU  Anesthesia Type: General  Level of Consciousness: awake, alert  and oriented  Airway & Oxygen Therapy: Patient Spontanous Breathing and Patient connected to nasal cannula oxygen  Post-op Assessment: Report given to PACU RN and Post -op Vital signs reviewed and stable  Post vital signs: Reviewed and stable  Complications: No apparent anesthesia complications

## 2016-02-01 NOTE — Procedures (Signed)
Patient name Kellie Curtis, Kellie Curtis DICTATION# [id number] CSN# [id number]  Darlyn Chamber, MD 02/01/2016 9:20 AM

## 2016-02-01 NOTE — Progress Notes (Signed)
History and physical exam unchangedPatient ID: Kellie Curtis, female   DOB: April 17, 1970, 45 y.o.   MRN: [id number]

## 2016-02-01 NOTE — Progress Notes (Signed)
Patient ID: Kellie Curtis, female   DOB: September 05, 1970, 45 y.o.   MRN: [id number] Af vss abd soft Inc clear Good uo  Start pca

## 2016-02-01 NOTE — Progress Notes (Signed)
Skin wiped with alcohol prep after EKG leads removed.no redness or irritation noted. Cell phone and charger, glasses, and bag of clothes to room with pt.

## 2016-02-01 NOTE — Anesthesia Postprocedure Evaluation (Signed)
Anesthesia Post Note  Patient: Kellie Curtis  Procedure(s) Performed: Procedure(s) (LRB): LAPAROSCOPIC ASSISTED VAGINAL HYSTERECTOMY WITH SALPINGECTOMY (Bilateral)  Patient location during evaluation: PACU Anesthesia Type: General Level of consciousness: awake and alert Pain management: pain level controlled Vital Signs Assessment: post-procedure vital signs reviewed and stable Respiratory status: spontaneous breathing, nonlabored ventilation, respiratory function stable and patient connected to nasal cannula oxygen Cardiovascular status: blood pressure returned to baseline and stable Postop Assessment: no signs of nausea or vomiting Anesthetic complications: no    Last Vitals:  Vitals:   02/01/16 1100 02/01/16 1226  BP: 119/77 123/83  Pulse: 83 90  Resp: 14 16  Temp: 36.4 C 36.4 C    Last Pain:  Vitals:   02/01/16 1226  TempSrc: Oral  PainSc:                  Irma Delancey J

## 2016-02-01 NOTE — Anesthesia Preprocedure Evaluation (Signed)
Anesthesia Evaluation  Patient identified by MRN, date of birth, ID band Patient awake    Reviewed: Allergy & Precautions, NPO status , Patient's Chart, lab work & pertinent test results  Airway Mallampati: II  TM Distance: >3 FB Neck ROM: Full    Dental no notable dental hx.    Pulmonary asthma ,    Pulmonary exam normal breath sounds clear to auscultation       Cardiovascular negative cardio ROS Normal cardiovascular exam Rhythm:Regular Rate:Normal     Neuro/Psych Seizures -,  PSYCHIATRIC DISORDERS Anxiety Depression  Neuromuscular disease    GI/Hepatic Neg liver ROS, GERD  ,  Endo/Other  negative endocrine ROS  Renal/GU negative Renal ROS  negative genitourinary   Musculoskeletal negative musculoskeletal ROS (+)   Abdominal (+) + obese,   Peds negative pediatric ROS (+)  Hematology  (+) anemia ,   Anesthesia Other Findings   Reproductive/Obstetrics negative OB ROS                             Anesthesia Physical Anesthesia Plan  ASA: II  Anesthesia Plan: General   Post-op Pain Management:    Induction: Intravenous  Airway Management Planned: Oral ETT  Additional Equipment:   Intra-op Plan:   Post-operative Plan: Extubation in OR  Informed Consent: I have reviewed the patients History and Physical, chart, labs and discussed the procedure including the risks, benefits and alternatives for the proposed anesthesia with the patient or authorized representative who has indicated his/her understanding and acceptance.   Dental advisory given  Plan Discussed with: CRNA  Anesthesia Plan Comments:         Anesthesia Quick Evaluation

## 2016-02-01 NOTE — Anesthesia Procedure Notes (Addendum)
Procedure Name: Intubation Date/Time: 02/01/2016 7:36 AM Performed by: Franne Grip Pre-anesthesia Checklist: Patient identified, Emergency Drugs available, Suction available and Patient being monitored Patient Re-evaluated:Patient Re-evaluated prior to inductionOxygen Delivery Method: Circle system utilized Preoxygenation: Pre-oxygenation with 100% oxygen Intubation Type: IV induction Ventilation: Mask ventilation without difficulty Laryngoscope Size: Mac and 3 Tube type: Oral Tube size: 7.0 mm Number of attempts: 1 Airway Equipment and Method: Stylet and LTA kit utilized Placement Confirmation: ETT inserted through vocal cords under direct vision,  positive ETCO2 and breath sounds checked- equal and bilateral Secured at: 21 cm Tube secured with: Tape Dental Injury: Teeth and Oropharynx as per pre-operative assessment

## 2016-02-02 ENCOUNTER — Encounter (HOSPITAL_BASED_OUTPATIENT_CLINIC_OR_DEPARTMENT_OTHER): Payer: Self-pay | Admitting: Obstetrics and Gynecology

## 2016-02-02 DIAGNOSIS — N800 Endometriosis of uterus: Secondary | ICD-10-CM | POA: Diagnosis not present

## 2016-02-02 LAB — CBC
HEMATOCRIT: 29.7 % — AB (ref 36.0–46.0)
HEMOGLOBIN: 9.5 g/dL — AB (ref 12.0–15.0)
MCH: 28.4 pg (ref 26.0–34.0)
MCHC: 32 g/dL (ref 30.0–36.0)
MCV: 88.9 fL (ref 78.0–100.0)
Platelets: 289 10*3/uL (ref 150–400)
RBC: 3.34 MIL/uL — ABNORMAL LOW (ref 3.87–5.11)
RDW: 14.4 % (ref 11.5–15.5)
WBC: 8.4 10*3/uL (ref 4.0–10.5)

## 2016-02-02 MED ORDER — HYDROMORPHONE HCL 2 MG PO TABS: 2.0000 mg | ORAL_TABLET | Freq: Four times a day (QID) | ORAL | 0 refills | Status: DC | PRN

## 2016-02-02 NOTE — Discharge Summary (Signed)
Patient name Kellie Curtis, Kellie Curtis DICTATION# [id number] CSN# [id number]  Darlyn Chamber, MD 02/02/2016 7:12 AM

## 2016-02-02 NOTE — Op Note (Signed)
NAMEMCCAULEY, MERGEN NO.:  [id number]  MEDICAL RECORD NO.:  [id number]  LOCATION:  1606                         FACILITY:  [facility]  PHYSICIAN:  Darlyn Chamber, M.D.   DATE OF BIRTH:  09-Dec-1970  DATE OF PROCEDURE:  02/01/2016 DATE OF DISCHARGE:                              OPERATIVE REPORT   PREOPERATIVE DIAGNOSIS:  Abnormal uterine bleeding secondary to adenomyosis.  POSTOPERATIVE DIAGNOSIS:  Abnormal uterine bleeding secondary to adenomyosis.  OPERATIVE PROCEDURE:  Laparoscopic-assisted vaginal hysterectomy with removal of both fallopian tubes.  SURGEON:  Darlyn Chamber, M.D.  ASSISTANT:  Marylynn Pearson, MD.  ANESTHESIA:  General endotracheal.  ESTIMATED BLOOD LOSS:  300 mL.  PACKS:  None.  DRAINS:  Included urethral Foley.  INTRAOPERATIVE BLOOD REPLACEMENT:  None.  COMPLICATIONS:  None.  INDICATION:  Dictated in history and physical.  PROCEDURE IN DETAIL:  The patient was taken to the OR and placed in supine position.  After satisfactory level of general anesthesia obtained, the patient was placed in dorsal lithotomy supine position using the Allen stirrups.  The abdomen, perineum and vagina were prepped out with Betadine.  Full bladder was emptied by in-and-out catheterization.  A Hulka tenaculum was put in place and secured.  The patient draped in sterile field.  A subumbilical incision was made with knife.  Incision was extended through the subcutaneous tissue.  Fascia was identified, entered sharply and incision was made in the fascia and extended laterally.  Muscles were separated.  The peritoneum was entered with blunt finger pressure.  Open laparoscopic trocar was put in place and secured.  Abdomen was insufflated with carbon dioxide.  A 5-mm trocar was put in place in suprapubic area.  Visualization revealed no evidence of injury to adjacent organs.  Upper abdomen and liver were normal.  Both lateral gutters were clear.  Appendix was  noted to be normal.  She did have some adhesions from the right fallopian tube to the uterus as well as to the ovary.  Left side was clear.  She did have some bladder adhesions.  At this point in time, the right fallopian tube was identified using the EnSeal, the mesenteric attachments were cauterized and incised and the tube was removed through the subumbilical port.  Then, we began separating the utero-ovarian pedicle.  The ovary was elevated.  Utero-ovarian pedicle was cauterized and incised using the EnSeal.  The right round ligament was cauterized and incised using the EnSeal.  There were some thickening and scarring on this side that were taken down.  We then went to the left side.  The left fallopian tube was elevated.  Mesenteric attachments were cauterized and incised and a fallopian tube was removed through the subumbilical port.  We then identified the utero-ovarian pedicle.  It was cauterized and incised. Left round ligament was cauterized and incised.  We had good hemostasis and separation of the uterus.  We then went to the bladder, there were some adhesions that were taken down using the EnSeal.  At this point in time, the decision was to go vaginally.  The abdomen was desufflated with carbon dioxide.  Laparoscope was removed.  The patient's legs were repositioned.  The Hulka tenaculum was then removed.  Weighted speculum was placed in the vaginal vault.  Cervix was grasped with single-tooth tenaculum.  Cul-de-sac was entered sharply. Both uterosacral ligaments were clamped, cut and suture ligated with 0 Vicryl.  Reflection of the vaginal mucosa anteriorly was incised using the Bovie.  Bladder was sharply dissected superiorly.  Paracervical tissue was clamped, cut and suture ligated with 0 Vicryl.  Using the clamp, cut and tie technique with suture ligatures of 0 Vicryl, we separated the parametrium from the side of the uterus.  Vesicouterine space was identified and  entered sharply and retractor was put in place. At this point in time, the uterus was flipped.  Remaining pedicles were clamped, cut and the uterus was passed off the operative field.  Held pedicles were secured with free ties of 0 Vicryl and suture ligatures of 0 Vicryl.  At this point in time, we went to the posterior cuff, it was run with running locking suture of 0 Vicryl.  We then began reapproximated the vaginal mucosa with interrupted figure-of-eights of 0 Vicryl.  Both uterosacral ligaments that were held were tied together. Remaining open part of the posterior cuff was closed with figure-of- eight of 0 Vicryl.  Foley was placed to straight drain.  We treated with adequate amount of clear urine.  Weighted speculum was then removed. The patient's legs were repositioned.  We went back on to top.  The abdomen was desufflated with carbon dioxide.  Visualization revealed fair hemostasis.  We brought in the bipolar.  We cauterized the areas that were some oozing.  We then thoroughly irrigated the pelvis.  We then reinflated the abdomen and there was no active bleeding or signs of oozing.  The abdomen was desufflated with carbon dioxide.  All trocars were removed.  Subumbilical fascia was closed with two figure-of-eights of 0 Vicryl.  Skin was closed with interrupted subcuticular 4-0 Vicryl. Suprapubic incision was closed with Dermabond.  Sponge, instrument and needle counts were reported as correct by circulating nurse x2.  The patient tolerated the procedure well.  Once extubated, transferred to the recovery room in good condition.     Darlyn Chamber, M.D.     JSM/MEDQ  D:  02/01/2016  T:  02/02/2016  Job:  [id number]

## 2016-02-02 NOTE — Progress Notes (Signed)
Pt was given discharge instructions and prescription. She had no questions about her orders. Pt was taken out by RN to main lobby. [place]

## 2016-02-02 NOTE — Progress Notes (Signed)
1 Day Post-Op Procedure(s) (LRB): LAPAROSCOPIC ASSISTED VAGINAL HYSTERECTOMY WITH SALPINGECTOMY (Bilateral)  Subjective: Patient reports tolerating PO.    Objective: I have reviewed patient's vital signs and labs.  General: alert GI: soft, non-tender; bowel sounds normal; no masses,  no organomegaly Vaginal Bleeding: minimal  Assessment: s/p Procedure(s): LAPAROSCOPIC ASSISTED VAGINAL HYSTERECTOMY WITH SALPINGECTOMY (Bilateral): stable  Plan: Discharge home  LOS: 0 days    Renny Remer S 02/02/2016, 7:08 AM

## 2016-02-03 NOTE — Discharge Summary (Signed)
NAMEBERNADENE, Kellie Curtis NO.:  [id number]  MEDICAL RECORD NO.:  [id number]  LOCATION:  [id number]                         FACILITY:  [place]:  Darlyn Chamber, M.D.   DATE OF BIRTH:  April 27, 1970  DATE OF ADMISSION:  02/01/2016 DATE OF DISCHARGE:  02/02/2016                              DISCHARGE SUMMARY   ADMITTING DIAGNOSES:  Abnormal uterine bleeding associated with pelvic pain, probably secondary to adenomyosis.  DISCHARGE DIAGNOSIS:  Abnormal uterine bleeding associated with pelvic pain, probably secondary to adenomyosis.  OPERATIVE PROCEDURE:  Laparoscopic-assisted vaginal hysterectomy with removal of both fallopian tubes.  For history and physical, please see dictated note.  COURSE IN THE HOSPITAL:  The patient was brought in and underwent laparoscopic-assisted vaginal hysterectomy with removal of both fallopian tubes.  Postop, her hemoglobin was 9.5.  She did well.  On her first postop day, she was tolerating a regular diet.  Her Foley had just been discontinued.  Abdomen was soft.  Bowel sounds were active.  All incisions were clear.  Bleeding was minimal.  In terms of complications, none were encountered during stay in the hospital.  The patient is discharged home in stable condition.  DISPOSITION:  The patient is to avoid heavy lifting, vaginal entrance, or driving a car.  She was instructed to call should there be fever, nausea, vomiting, excessive bleeding, or excessive pain.  Also instructed on signs and symptoms of deep venous thrombosis and pulmonary embolus.  She will be sent home with Dilaudid as needed for pain.     Darlyn Chamber, M.D.     JSM/MEDQ  D:  02/02/2016  T:  02/03/2016  Job:  [id number]

## 2016-02-23 ENCOUNTER — Ambulatory Visit (INDEPENDENT_AMBULATORY_CARE_PROVIDER_SITE_OTHER): Payer: BLUE CROSS/BLUE SHIELD | Admitting: Family Medicine

## 2016-02-23 ENCOUNTER — Encounter: Payer: Self-pay | Admitting: Family Medicine

## 2016-02-23 VITALS — BP 120/80 | HR 78 | Temp 97.8°F | Resp 12 | Ht 64.0 in | Wt 180.4 lb

## 2016-02-23 DIAGNOSIS — M542 Cervicalgia: Secondary | ICD-10-CM

## 2016-02-23 DIAGNOSIS — H60501 Unspecified acute noninfective otitis externa, right ear: Secondary | ICD-10-CM

## 2016-02-23 DIAGNOSIS — J069 Acute upper respiratory infection, unspecified: Secondary | ICD-10-CM

## 2016-02-23 DIAGNOSIS — K0510 Chronic gingivitis, plaque induced: Secondary | ICD-10-CM

## 2016-02-23 DIAGNOSIS — J309 Allergic rhinitis, unspecified: Secondary | ICD-10-CM

## 2016-02-23 MED ORDER — FLUTICASONE PROPIONATE 50 MCG/ACT NA SUSP: 1.0000 | Freq: Two times a day (BID) | NASAL | 2 refills | Status: DC

## 2016-02-23 MED ORDER — CIPROFLOXACIN-DEXAMETHASONE 0.3-0.1 % OT SUSP: 4.0000 [drp] | Freq: Two times a day (BID) | OTIC | 0 refills | Status: AC

## 2016-02-23 NOTE — Progress Notes (Signed)
HPI:  ACUTE VISIT:  Chief Complaint  Patient presents with  . right ear/face pain    [name] is a 45 y.o. female, who is here today complaining of 2-3 days of respiratory symptoms. Started with right earache and mandibular pain, noted that gum was red. Today right mandibular soreness is better, no Hx of trauma. + Mild, intermittent right hearing loss, fullness sensation. No ear drainage.  Productive cough with "discolored" sputum, light yellow.  + Subjective fever yesterday, chill, and myalgias (mainly upper back and neck). + Nasal congestion, rhinorrhea, sore throat, and post nasal drainage.  Denies chest pain, dyspnea, or wheezing.   No Hx of recent travel. No sick contact. No known insect bite. + Hx of allergies: Rhinitis and asthma.  OTC medications for this problem: Ibuprofen, last taken last night. Symptoms otherwise stable.    Review of Systems  Constitutional: Positive for chills and fatigue. Negative for activity change, appetite change and fever.  HENT: Positive for congestion, ear pain, postnasal drip, sinus pressure and sore throat. Negative for facial swelling, mouth sores, nosebleeds, sneezing, trouble swallowing and voice change.   Eyes: Negative for discharge, redness and itching.  Respiratory: Positive for cough. Negative for shortness of breath and wheezing.   Gastrointestinal: Negative for diarrhea, nausea and vomiting.  Musculoskeletal: Positive for myalgias and neck pain. Negative for gait problem.  Skin: Negative for rash.  Allergic/Immunologic: Positive for environmental allergies.  Neurological: Negative for syncope, weakness and headaches.  Hematological: Negative for adenopathy. Does not bruise/bleed easily.  Psychiatric/Behavioral: Negative for confusion. The patient is nervous/anxious.       Current Outpatient Prescriptions on File Prior to Visit  Medication Sig Dispense Refill  . dicyclomine (BENTYL) 20 MG tablet Take 1  tablet (20 mg total) by mouth 2 (two) times daily. 20 tablet 0  . HYDROmorphone (DILAUDID) 2 MG tablet Take 1 tablet (2 mg total) by mouth every 6 (six) hours as needed for severe pain. 30 tablet 0  . methylphenidate (METADATE CD) 40 MG CR capsule Take 1 capsule (40 mg total) by mouth every morning. To fill Feb 21, 2016 (Patient taking differently: Take 40 mg by mouth daily as needed. To fill Feb 21, 2016) 30 capsule 0  . naproxen (NAPROSYN) 500 MG tablet Take 1 tablet (500 mg total) by mouth 2 (two) times daily. 20 tablet 0  . ondansetron (ZOFRAN ODT) 4 MG disintegrating tablet Take 1 tablet (4 mg total) by mouth every 8 (eight) hours as needed for nausea. 10 tablet 0  . [DISCONTINUED] pantoprazole (PROTONIX) 40 MG tablet Take 1 tablet (40 mg total) by mouth daily. 30 min before breakfast (Patient not taking: Reported on 02/10/2015) 30 tablet 1   No current facility-administered medications on file prior to visit.      Past Medical History:  Diagnosis Date  . Abdominal pain, left lower quadrant   . ADHD (attention deficit hyperactivity disorder)   . Allergic rhinitis   . Anemia   . Anxiety state, unspecified   . Asthma    Controlled  . Depression   . Dyslexia   . Dysmenorrhea   . Elevated blood pressure reading without diagnosis of hypertension   . GERD (gastroesophageal reflux disease)   . History of adenomatous polyp of colon    tubular adenoma's 2012;  2017  . Hyperlipidemia   . IBS (irritable bowel syndrome)   . Menorrhagia   . Other convulsions   . Seizure disorder ([place])   . Uterus,  adenomyosis    Allergies  Allergen Reactions  . Latex Rash  . Adhesive [Tape] Rash  . Hydrocodone-Homatropine Itching    Social History   Social History  . Marital status: Single    Spouse name: N/A  . Number of children: 3  . Years of education: N/A   Occupational History  .  [place] History Main Topics  . Smoking status: Never Smoker  . Smokeless tobacco: Never Used    . Alcohol use 1.2 oz/week    2 Cans of beer per week  . Drug use: No  . Sexual activity: Yes    Birth control/ protection: Pill     Comment: 1ST INTERCOURSE- 15, PARTNERS- GREATER THAN 5   Other Topics Concern  . None   Social History Narrative  . None    Vitals:   02/23/16 1020  BP: 120/80  Pulse: 78  Resp: 12  Temp: 97.8 F (36.6 C)   O2 sat at RA 96%  Body mass index is 30.96 kg/m.    Physical Exam  Nursing note and vitals reviewed. Constitutional: She is oriented to person, place, and time. She appears well-developed. She does not appear ill. No distress.  HENT:  Head: Atraumatic.  Right Ear: Tympanic membrane normal. There is tenderness. No mastoid tenderness. Tympanic membrane is not injected and not erythematous.  Left Ear: Tympanic membrane, external ear and ear canal normal.  Nose: Right sinus exhibits no maxillary sinus tenderness and no frontal sinus tenderness. Left sinus exhibits no maxillary sinus tenderness and no frontal sinus tenderness.  Mouth/Throat: Uvula is midline, oropharynx is clear and moist and mucous membranes are normal.  Right ear canal no erythematous and no edema appreciated, she has pain upon otoscopic examination. Mild amount of cerumen in ear canal.  Right lower gingival erythema, no edema.   Eyes: Conjunctivae are normal.  Neck: No edema and no erythema present.  Cardiovascular: Normal rate and regular rhythm.   No murmur heard. Respiratory: Effort normal. No stridor. No respiratory distress. She has no wheezes. She has no rales. Tenderness: chest wall tenderness with palpation, R>L.  Musculoskeletal: She exhibits no edema.  Right trapezium tenderness with palpation. Cervical ROM otherwise normal, elicit some pain. TMJ normal ROM bilateral, no crepitus, and no pain with palpation of movement.   Lymphadenopathy:       Head (right side): No submandibular adenopathy present.       Head (left side): No submandibular adenopathy  present.    She has no cervical adenopathy.  Neurological: She is alert and oriented to person, place, and time. She has normal strength.  Skin: Skin is warm. No rash noted. No erythema.  Psychiatric: Her speech is normal. Her mood appears anxious.  Well groomed, good eye contact.      ASSESSMENT AND PLAN:     Kellie Curtis was seen today for right ear/face pain.  Diagnoses and all orders for this visit:   URI, acute  Symptoms suggests a viral etiology, I explained patient that symptomatic treatment is usually recommended in this case, so I do not think abx is needed at this time. Instructed to monitor for signs of complications, clearly instructed about warning signs. I also explained that cough and nasal congestion can last a few days and sometimes weeks. F/U as needed.   Acute otitis externa of right ear, unspecified type  Clinical findings do not correlate with severity of symptoms she is reporting. She has tenderness with otoscopic examination right ear,  so recommend topical abx treatment for 7 days. Instructed about warning signs. F/U with PCP as needed.  -     ciprofloxacin-dexamethasone (CIPRODEX) otic suspension; Place 4 drops into the right ear 2 (two) times daily.  Allergic rhinitis, unspecified chronicity, unspecified seasonality, unspecified trigger  Some of symptoms reported could be aggravated/related with allergies. Flonase nasal spray and OTC antihistaminic recommended.  -     fluticasone (FLONASE) 50 MCG/ACT nasal spray; Place 1 spray into both nostrils 2 (two) times daily.  Cervicalgia  Local heat and massage. F/U as needed.   Gingivitis  Adequate oral hygiene recommended. No evidence of dental abscess. Recommend following with dentist.        Return if symptoms worsen or fail to improve, for PCP.     -[name] was advised to return or notify a doctor immediately if symptoms worsen or persist or new concerns  arise.       Jeremie Abdelaziz G. [place], MD  [facility]. [place] office.

## 2016-02-23 NOTE — Patient Instructions (Addendum)
  [name] I have seen you today for an acute visit.  1. Acute otitis externa of right ear, unspecified type   2. Allergic rhinitis, unspecified chronicity, unspecified seasonality, unspecified trigger   3. Cervicalgia Local massage and heat.  4. URI, acute  viral infections are self-limited and we treat each symptom depending of severity.  Over the counter medications as decongestants and cold medications usually help, they need to be taken with caution if there is a history of high blood pressure or palpitations. Tylenol and/or Ibuprofen also helps with most symptoms (headache, muscle aching, fever,etc) Plenty of fluids. Honey helps with cough. Steam inhalations helps with runny nose, nasal congestion, and may prevent sinus infections. Cough and nasal congestion could last a few days and sometimes weeks. Please follow in not any better in 1-2 weeks or if symptoms get worse.  5. Gingivitis Dental evaluation may be needed. Good dental hygiene.     In general please monitor for signs of worsening symptoms and seek immediate medical attention if any concerning/warning symptom as we discussed.   If symptoms are not resolved in 1-2 weeks you should schedule a follow up appointment with your doctor, before if needed.  Please be sure you have an appointment already scheduled with your PCP before you leave today.

## 2016-03-01 ENCOUNTER — Ambulatory Visit (INDEPENDENT_AMBULATORY_CARE_PROVIDER_SITE_OTHER): Payer: BLUE CROSS/BLUE SHIELD | Admitting: Internal Medicine

## 2016-03-01 ENCOUNTER — Encounter: Payer: Self-pay | Admitting: Internal Medicine

## 2016-03-01 VITALS — BP 128/72 | HR 70 | Temp 98.5°F | Resp 20 | Wt 175.0 lb

## 2016-03-01 DIAGNOSIS — K5900 Constipation, unspecified: Secondary | ICD-10-CM

## 2016-03-01 DIAGNOSIS — K449 Diaphragmatic hernia without obstruction or gangrene: Secondary | ICD-10-CM

## 2016-03-01 DIAGNOSIS — H6091 Unspecified otitis externa, right ear: Secondary | ICD-10-CM | POA: Diagnosis not present

## 2016-03-01 DIAGNOSIS — I889 Nonspecific lymphadenitis, unspecified: Secondary | ICD-10-CM | POA: Insufficient documentation

## 2016-03-01 MED ORDER — LEVOFLOXACIN 500 MG PO TABS: 500.0000 mg | ORAL_TABLET | Freq: Every day | ORAL | 0 refills | Status: AC

## 2016-03-01 MED ORDER — PANTOPRAZOLE SODIUM 40 MG PO TBEC: 40.0000 mg | DELAYED_RELEASE_TABLET | Freq: Every day | ORAL | 3 refills | Status: DC

## 2016-03-01 NOTE — Patient Instructions (Signed)
Please take all new medication as prescribed - the antibiotic, as well as the protonix  OK to restart the OTC Miralax as well  Please continue all other medications as before, and refills have been done if requested.  Please have the pharmacy call with any other refills you may need.  Please keep your appointments with your specialists as you may have planned

## 2016-03-01 NOTE — Progress Notes (Signed)
Pre visit review using our clinic review tool, if applicable. No additional management support is needed unless otherwise documented below in the visit note. 

## 2016-03-01 NOTE — Progress Notes (Signed)
Subjective:    Patient ID: Kellie Curtis, female    DOB: 1970-07-31, 45 y.o.   MRN: [id number]  HPI   Here with 2-3 days acute onset fever, severe right ear canal pain and tenderness,, pressure, headache, general weakness and malaise, and small d/c, without mild ST or cough, and pt denies chest pain, wheezing, increased sob or doe, orthopnea, PND, increased LE swelling, palpitations, dizziness or syncope.has some swelling to the right angle of jaw as well quite tender,  Also has had mild worsening reflux, without other abd pain, dysphagia, n/v, diarrhea or blood.  Does have ongoing IBS with constipation, with increased bloating with recent decreased BM freq in the past few weeks Past Medical History:  Diagnosis Date  . Abdominal pain, left lower quadrant   . ADHD (attention deficit hyperactivity disorder)   . Allergic rhinitis   . Anemia   . Anxiety state, unspecified   . Asthma    Controlled  . Depression   . Dyslexia   . Dysmenorrhea   . Elevated blood pressure reading without diagnosis of hypertension   . GERD (gastroesophageal reflux disease)   . History of adenomatous polyp of colon    tubular adenoma's 2012;  2017  . Hyperlipidemia   . IBS (irritable bowel syndrome)   . Menorrhagia   . Other convulsions   . Seizure disorder ([place])   . Uterus, adenomyosis    Past Surgical History:  Procedure Laterality Date  . CESAREAN SECTION  x2  last one 02-22-2005   Bilateral Tubal Ligation w/ last one  . COLONOSCOPY  last one 11-27-2015  . LAPAROSCOPIC VAGINAL HYSTERECTOMY WITH SALPINGECTOMY Bilateral 02/01/2016   Procedure: LAPAROSCOPIC ASSISTED VAGINAL HYSTERECTOMY WITH SALPINGECTOMY;  Surgeon: Arvella Nigh, MD;  Location: [facility];  Service: Gynecology;  Laterality: Bilateral;  . TONSILLECTOMY      reports that she has never smoked. She has never used smokeless tobacco. She reports that she drinks about 1.2 oz of alcohol per week . She reports that she does not use  drugs. family history includes Asthma in her mother; Breast cancer in her maternal grandmother; Colon cancer in her father; Gout in her father; Hyperlipidemia in her father; Hypertension in her father; Stroke in her father. Allergies  Allergen Reactions  . Latex Rash  . Adhesive [Tape] Rash  . Hydrocodone-Homatropine Itching   Current Outpatient Prescriptions on File Prior to Visit  Medication Sig Dispense Refill  . dicyclomine (BENTYL) 20 MG tablet Take 1 tablet (20 mg total) by mouth 2 (two) times daily. 20 tablet 0  . fluticasone (FLONASE) 50 MCG/ACT nasal spray Place 1 spray into both nostrils 2 (two) times daily. 16 g 2  . HYDROmorphone (DILAUDID) 2 MG tablet Take 1 tablet (2 mg total) by mouth every 6 (six) hours as needed for severe pain. 30 tablet 0  . methylphenidate (METADATE CD) 40 MG CR capsule Take 1 capsule (40 mg total) by mouth every morning. To fill Feb 21, 2016 (Patient taking differently: Take 40 mg by mouth daily as needed. To fill Feb 21, 2016) 30 capsule 0  . naproxen (NAPROSYN) 500 MG tablet Take 1 tablet (500 mg total) by mouth 2 (two) times daily. 20 tablet 0  . ondansetron (ZOFRAN ODT) 4 MG disintegrating tablet Take 1 tablet (4 mg total) by mouth every 8 (eight) hours as needed for nausea. 10 tablet 0   No current facility-administered medications on file prior to visit.    Review of Systems  Constitutional:  Negative for unusual diaphoresis or night sweats HENT: Negative for ear swelling or discharge Eyes: Negative for worsening visual haziness  Respiratory: Negative for choking and stridor.   Gastrointestinal: Negative for distension or worsening eructation Genitourinary: Negative for retention or change in urine volume.  Musculoskeletal: Negative for other MSK pain or swelling Skin: Negative for color change and worsening wound Neurological: Negative for tremors and numbness other than noted  Psychiatric/Behavioral: Negative for decreased concentration or  agitation other than above   All other system neg per pt    Objective:   Physical Exam BP 128/72   Pulse 70   Temp 98.5 F (36.9 C) (Oral)   Resp 20   Wt 175 lb (79.4 kg)   LMP 01/20/2016 (Exact Date)   SpO2 98%   BMI 30.04 kg/m  VS noted, mild ill appaering Constitutional: Pt appears in no apparent distress HENT: Head: NCAT.  Right Ear: External ear normal but canal with 2-3+ red/tender/sweling and mucous d/c;.  Left Ear: External ear normal.  Eyes: . Pupils are equal, round, and reactive to light. Conjunctivae and EOM are normal Neck: Normal range of motion. Neck supple.but with 1-2+ multiple tender LA at right angle of jaw and the preSCM chain  Cardiovascular: Normal rate and regular rhythm.   Pulmonary/Chest: Effort normal and breath sounds without rales or wheezing.  Abd:  Soft, ND, + BS with mild epigastric tender, no guarding or rebound Neurological: Pt is alert. Not confused , motor grossly intact Skin: Skin is warm. No rash, no LE edema Psychiatric: Pt behavior is normal. No agitation.     Assessment & Plan:

## 2016-03-07 NOTE — Assessment & Plan Note (Signed)
Likely related to above, Mild to mod, for antibx course as above,  to f/u any worsening symptoms or concerns

## 2016-03-07 NOTE — Assessment & Plan Note (Signed)
Exam benign, ok to restart the miralax asd,  to f/u any worsening symptoms or concerns

## 2016-03-07 NOTE — Assessment & Plan Note (Signed)
Mod to severe, for antibx course,  to f/u any worsening symptoms or concerns 

## 2016-04-12 ENCOUNTER — Ambulatory Visit (INDEPENDENT_AMBULATORY_CARE_PROVIDER_SITE_OTHER): Payer: BLUE CROSS/BLUE SHIELD | Admitting: Family Medicine

## 2016-04-12 ENCOUNTER — Encounter: Payer: Self-pay | Admitting: Family Medicine

## 2016-04-12 ENCOUNTER — Telehealth: Payer: Self-pay | Admitting: Internal Medicine

## 2016-04-12 ENCOUNTER — Ambulatory Visit (INDEPENDENT_AMBULATORY_CARE_PROVIDER_SITE_OTHER)
Admission: RE | Admit: 2016-04-12 | Discharge: 2016-04-12 | Disposition: A | Discharge status: Home / Self Care | Payer: BLUE CROSS/BLUE SHIELD | Source: Ambulatory Visit | Attending: Family Medicine | Admitting: Family Medicine

## 2016-04-12 VITALS — BP 124/80 | HR 62 | Temp 98.3°F | Ht 64.0 in | Wt 178.4 lb

## 2016-04-12 DIAGNOSIS — J45909 Unspecified asthma, uncomplicated: Secondary | ICD-10-CM

## 2016-04-12 DIAGNOSIS — J069 Acute upper respiratory infection, unspecified: Secondary | ICD-10-CM

## 2016-04-12 DIAGNOSIS — R05 Cough: Secondary | ICD-10-CM | POA: Diagnosis not present

## 2016-04-12 DIAGNOSIS — R059 Cough, unspecified: Secondary | ICD-10-CM

## 2016-04-12 DIAGNOSIS — J309 Allergic rhinitis, unspecified: Secondary | ICD-10-CM | POA: Diagnosis not present

## 2016-04-12 LAB — POCT INFLUENZA A/B
INFLUENZA A, POC: NEGATIVE
INFLUENZA B, POC: NEGATIVE

## 2016-04-12 MED ORDER — ALBUTEROL SULFATE HFA 108 (90 BASE) MCG/ACT IN AERS: 2.0000 | INHALATION_SPRAY | Freq: Four times a day (QID) | RESPIRATORY_TRACT | 0 refills | Status: DC | PRN

## 2016-04-12 MED ORDER — METHYLPHENIDATE HCL ER (CD) 40 MG PO CPCR: 40.0000 mg | ORAL_CAPSULE | ORAL | 0 refills | Status: DC

## 2016-04-12 MED ORDER — BENZONATATE 100 MG PO CAPS
200.0000 mg | ORAL_CAPSULE | Freq: Two times a day (BID) | ORAL | 0 refills | Status: AC | PRN
Start: 2016-04-12 — End: 2016-04-22

## 2016-04-12 NOTE — Progress Notes (Signed)
HPI:  ACUTE VISIT  Chief Complaint  Patient presents with  . chest congestion    [name] is a 46 y.o.female here today complaining of 2-3 days of respiratory symptoms.  Non productive cough until yesterday, coughing up greening sputum. She denies fever, chills, or body aches. She is reporting a "little bit" of "red" spots and strings in sputum after coughing spells. Denies epistaxis. Denies abnormal wt loss.  + Nasal congestion, rhinorrhea, sore throat, and post nasal drainage.  She has not noted chest pain or dyspnea, and not sure about wheezing.  No Hx of recent overseas travel. No sick contact.   Hx of allergies: Yes, allergic rhinitis and asthma. She has not used albuterol inhaler in a while. Intermittent rhinorrhea and nasal congestion as well as postnasal drainage during the year, she has not identified exacerbating or alleviating factors.  Denies Hx of tobacco use.  OTC medications for this problem: None.  Symptoms otherwise stable.  She is concerned because she "keeps getting sick", she feels like "something is wrong", would like CXR done.    Review of Systems  Constitutional: Positive for fatigue. Negative for activity change, appetite change and fever.  HENT: Positive for congestion, postnasal drip, rhinorrhea, sore throat and voice change. Negative for ear pain, mouth sores, nosebleeds, sinus pain, sneezing and trouble swallowing.   Eyes: Negative for discharge and redness.  Respiratory: Positive for cough. Negative for shortness of breath.   Cardiovascular: Negative for chest pain.  Gastrointestinal: Negative for abdominal pain, diarrhea, nausea and vomiting.  Musculoskeletal: Negative for myalgias and neck pain.  Skin: Negative for rash.  Allergic/Immunologic: Positive for environmental allergies.  Neurological: Negative for syncope, weakness and headaches.  Hematological: Negative for adenopathy. Does not bruise/bleed easily.    Psychiatric/Behavioral: The patient is nervous/anxious.       Current Outpatient Prescriptions on File Prior to Visit  Medication Sig Dispense Refill  . dicyclomine (BENTYL) 20 MG tablet Take 1 tablet (20 mg total) by mouth 2 (two) times daily. 20 tablet 0  . fluticasone (FLONASE) 50 MCG/ACT nasal spray Place 1 spray into both nostrils 2 (two) times daily. 16 g 2  . HYDROmorphone (DILAUDID) 2 MG tablet Take 1 tablet (2 mg total) by mouth every 6 (six) hours as needed for severe pain. 30 tablet 0  . methylphenidate (METADATE CD) 40 MG CR capsule Take 1 capsule (40 mg total) by mouth every morning. To fill Feb 21, 2016 (Patient taking differently: Take 40 mg by mouth daily as needed. To fill Feb 21, 2016) 30 capsule 0  . naproxen (NAPROSYN) 500 MG tablet Take 1 tablet (500 mg total) by mouth 2 (two) times daily. 20 tablet 0  . ondansetron (ZOFRAN ODT) 4 MG disintegrating tablet Take 1 tablet (4 mg total) by mouth every 8 (eight) hours as needed for nausea. 10 tablet 0  . pantoprazole (PROTONIX) 40 MG tablet Take 1 tablet (40 mg total) by mouth daily. 90 tablet 3   No current facility-administered medications on file prior to visit.      Past Medical History:  Diagnosis Date  . Abdominal pain, left lower quadrant   . ADHD (attention deficit hyperactivity disorder)   . Allergic rhinitis   . Anemia   . Anxiety state, unspecified   . Asthma    Controlled  . Depression   . Dyslexia   . Dysmenorrhea   . Elevated blood pressure reading without diagnosis of hypertension   . GERD (gastroesophageal reflux  disease)   . History of adenomatous polyp of colon    tubular adenoma's 2012;  2017  . Hyperlipidemia   . IBS (irritable bowel syndrome)   . Menorrhagia   . Other convulsions   . Seizure disorder ([place])   . Uterus, adenomyosis    Allergies  Allergen Reactions  . Latex Rash  . Adhesive [Tape] Rash  . Hydrocodone-Homatropine Itching    Social History   Social History  .  Marital status: Single    Spouse name: N/A  . Number of children: 3  . Years of education: N/A   Occupational History  .  [place] History Main Topics  . Smoking status: Never Smoker  . Smokeless tobacco: Never Used  . Alcohol use 1.2 oz/week    2 Cans of beer per week  . Drug use: No  . Sexual activity: Yes    Birth control/ protection: Pill     Comment: 1ST INTERCOURSE- 15, PARTNERS- GREATER THAN 5   Other Topics Concern  . None   Social History Narrative  . None    Vitals:   04/12/16 0846  BP: 124/80  Pulse: 62  Temp: 98.3 F (36.8 C)   O2 sat at RA 96%.  Body mass index is 30.62 kg/m.    Physical Exam  Constitutional: She is oriented to person, place, and time. She appears well-developed. She does not appear ill. No distress.  HENT:  Head: Atraumatic.  Right Ear: Tympanic membrane, external ear and ear canal normal.  Left Ear: Tympanic membrane, external ear and ear canal normal.  Nose: Rhinorrhea present. Right sinus exhibits no maxillary sinus tenderness and no frontal sinus tenderness. Left sinus exhibits no maxillary sinus tenderness and no frontal sinus tenderness.  Mouth/Throat: Uvula is midline and mucous membranes are normal. Posterior oropharyngeal erythema (mild) present. No oropharyngeal exudate or posterior oropharyngeal edema.  Hypertrophic turbinates. Mild dysphonia.  Eyes: Conjunctivae and EOM are normal.  Neck: Neck supple. No muscular tenderness present. No edema present.  Cardiovascular: Normal rate and regular rhythm.   No murmur heard. Respiratory: Effort normal and breath sounds normal. No stridor. No respiratory distress.  Lymphadenopathy:       Head (right side): No submandibular adenopathy present.       Head (left side): No submandibular adenopathy present.    She has no cervical adenopathy.  Neurological: She is alert and oriented to person, place, and time. She has normal strength. Coordination and gait normal.  Skin:  Skin is warm. No rash noted. No erythema.  Psychiatric: Her speech is normal. Her mood appears anxious.  Well groomed, good eye contact.      ASSESSMENT AND PLAN:     Kellie Curtis was seen today for chest congestion.  Diagnoses and all orders for this visit:   URI, acute  Symptoms suggests a viral etiology, I explained patient that symptomatic treatment is usually recommended in this case, so I do not think abx is needed at this time. Instructed to monitor for signs of complications, instructed about warning signs. I also explained that cough and nasal congestion can last a few days and sometimes weeks. F/U as needed.   -     benzonatate (TESSALON) 100 MG capsule; Take 2 capsules (200 mg total) by mouth 2 (two) times daily as needed for cough. -     POCT Influenza A/B  Allergic rhinitis, unspecified chronicity, unspecified seasonality, unspecified trigger  This problem can be aggravating symptoms. Flonase nasal spray to  continue, nasal saline, and consider OTC antihistaminic. F/U with PCP if still symptomatic.  Uncomplicated asthma, unspecified asthma severity, unspecified whether persistent  Today Auscultation Is Negative. Recommended Albuterol 2 Puffs qid for a week then prn. F/U with PCP.  -     albuterol (PROVENTIL HFA;VENTOLIN HFA) 108 (90 Base) MCG/ACT inhaler; Inhale 2 puffs into the lungs every 6 (six) hours as needed for wheezing or shortness of breath. -     DG Chest 2 View; Future  Cough  Explained that cough can last a few weeks after URI. Other possible causes discussed. Benzonatate may help.  She would like to have imaging today, further recommendations will be given accordingly.  -     DG Chest 2 View; Future       -Ms. Kellie Curtis was advised to return or notify a doctor immediately if symptoms worsen or persist or new concerns arise.       Betty G. Martinique, MD  [facility]. [place] office.

## 2016-04-12 NOTE — Telephone Encounter (Signed)
Done hardcopy to Corinne  

## 2016-04-12 NOTE — Progress Notes (Signed)
Pre visit review using our clinic review tool, if applicable. No additional management support is needed unless otherwise documented below in the visit note. 

## 2016-04-12 NOTE — Patient Instructions (Signed)
  [name] I have seen you today for an acute visit.  1. URI, acute  - benzonatate (TESSALON) 100 MG capsule; Take 2 capsules (200 mg total) by mouth 2 (two) times daily as needed for cough.  Dispense: 45 capsule; Refill: 0  2. Allergic rhinitis, unspecified chronicity, unspecified seasonality, unspecified trigger   3. Uncomplicated asthma, unspecified asthma severity, unspecified whether persistent  - albuterol (PROVENTIL HFA;VENTOLIN HFA) 108 (90 Base) MCG/ACT inhaler; Inhale 2 puffs into the lungs every 6 (six) hours as needed for wheezing or shortness of breath.  Dispense: 1 Inhaler; Refill: 0    viral infections are self-limited and we treat each symptom depending of severity.  Over the counter medications as decongestants and cold medications usually help, they need to be taken with caution if there is a history of high blood pressure or palpitations. Tylenol and/or Ibuprofen also helps with most symptoms (headache, muscle aching, fever,etc) Plenty of fluids. Honey helps with cough. Steam inhalations helps with runny nose, nasal congestion, and may prevent sinus infections. Cough and nasal congestion could last a few days and sometimes weeks. Please follow in not any better in 1-2 weeks or if symptoms get worse.   In general please monitor for signs of worsening symptoms and seek immediate medical attention if any concerning/warning symptom.   If symptoms are not resolved in 2-3 weeks you should schedule a follow up appointment with your doctor, before if needed.  Allergies can aggravate symptoms.  I do not think antibiotics are needed at this time.

## 2016-04-12 NOTE — Telephone Encounter (Signed)
Patient requesting three month script for methylphenidate

## 2016-04-13 ENCOUNTER — Encounter: Payer: Self-pay | Admitting: Internal Medicine

## 2016-04-13 ENCOUNTER — Ambulatory Visit (INDEPENDENT_AMBULATORY_CARE_PROVIDER_SITE_OTHER): Payer: BLUE CROSS/BLUE SHIELD | Admitting: Internal Medicine

## 2016-04-13 VITALS — BP 136/80 | HR 78 | Temp 99.7°F | Resp 20 | Wt 178.0 lb

## 2016-04-13 DIAGNOSIS — R062 Wheezing: Secondary | ICD-10-CM

## 2016-04-13 DIAGNOSIS — F909 Attention-deficit hyperactivity disorder, unspecified type: Secondary | ICD-10-CM | POA: Diagnosis not present

## 2016-04-13 DIAGNOSIS — R05 Cough: Secondary | ICD-10-CM | POA: Diagnosis not present

## 2016-04-13 DIAGNOSIS — R059 Cough, unspecified: Secondary | ICD-10-CM

## 2016-04-13 MED ORDER — HYDROCODONE-HOMATROPINE 5-1.5 MG/5ML PO SYRP: 5.0000 mL | ORAL_SOLUTION | Freq: Four times a day (QID) | ORAL | 0 refills | Status: AC | PRN

## 2016-04-13 MED ORDER — METHYLPHENIDATE HCL ER (CD) 40 MG PO CPCR: 40.0000 mg | ORAL_CAPSULE | ORAL | 0 refills | Status: DC

## 2016-04-13 MED ORDER — CEPHALEXIN 500 MG PO CAPS: 500.0000 mg | ORAL_CAPSULE | Freq: Three times a day (TID) | ORAL | 0 refills | Status: AC

## 2016-04-13 NOTE — Assessment & Plan Note (Signed)
/  Mild to mod, for predpac asd,  to f/u any worsening symptoms or concerns 

## 2016-04-13 NOTE — Patient Instructions (Signed)
Please take all new medication as prescribed - the antibiotic, and prednisone  Please continue all other medications as before, and refills have been done if requested.  Please have the pharmacy call with any other refills you may need.  Please keep your appointments with your specialists as you may have planned      

## 2016-04-13 NOTE — Addendum Note (Signed)
Addended by: Biagio Borg on: 04/13/2016 02:59 PM   Modules accepted: Orders

## 2016-04-13 NOTE — Progress Notes (Signed)
Pre visit review using our clinic review tool, if applicable. No additional management support is needed unless otherwise documented below in the visit note. 

## 2016-04-13 NOTE — Assessment & Plan Note (Signed)
Mild to mod, c/w bronchitis vs pna, cxr yesterday neg for infilatrate, for antibx course, cough med prn,  to f/u any worsening symptoms or concerns

## 2016-04-13 NOTE — Assessment & Plan Note (Signed)
meds refilled and ready fo pt

## 2016-04-13 NOTE — Progress Notes (Signed)
Subjective:    Patient ID: Kellie Curtis, female    DOB: 26-Mar-1971, 46 y.o.   MRN: [id number]  HPI  Here to f/u after seen at [facility] yesterday, flu neg, and pt wanted to f/u here. CXR yesterday neg for acute.  Here with 2-3 days acute onset fever, facial pain, pressure, headache, general weakness and malaise, and now greenish d/c, with mild ST and more prod cough, but pt denies chest pain, increased sob or doe, orthopnea, PND, increased LE swelling, palpitations, dizziness or syncope, though had some wheezing last PM, and leftover old inhaler did not seem to help with sob last PM.  Was tx with cough med and inhaler per UC but not working, could not afford inhaler.  ADD meds working ok, plans to try to get back to work soon but looking for job.  Denies worsening depressive symptoms, suicidal ideation, or panic; has ongoing anxiety  No other new history Past Medical History:  Diagnosis Date  . Abdominal pain, left lower quadrant   . ADHD (attention deficit hyperactivity disorder)   . Allergic rhinitis   . Anemia   . Anxiety state, unspecified   . Asthma    Controlled  . Depression   . Dyslexia   . Dysmenorrhea   . Elevated blood pressure reading without diagnosis of hypertension   . GERD (gastroesophageal reflux disease)   . History of adenomatous polyp of colon    tubular adenoma's 2012;  2017  . Hyperlipidemia   . IBS (irritable bowel syndrome)   . Menorrhagia   . Other convulsions   . Seizure disorder ([place])   . Uterus, adenomyosis    Past Surgical History:  Procedure Laterality Date  . CESAREAN SECTION  x2  last one 02-22-2005   Bilateral Tubal Ligation w/ last one  . COLONOSCOPY  last one 11-27-2015  . LAPAROSCOPIC VAGINAL HYSTERECTOMY WITH SALPINGECTOMY Bilateral 02/01/2016   Procedure: LAPAROSCOPIC ASSISTED VAGINAL HYSTERECTOMY WITH SALPINGECTOMY;  Surgeon: Arvella Nigh, MD;  Location: [facility];  Service: Gynecology;  Laterality: Bilateral;  . TONSILLECTOMY       reports that she has never smoked. She has never used smokeless tobacco. She reports that she drinks about 1.2 oz of alcohol per week . She reports that she does not use drugs. family history includes Asthma in her mother; Breast cancer in her maternal grandmother; Colon cancer in her father; Gout in her father; Hyperlipidemia in her father; Hypertension in her father; Stroke in her father. Allergies  Allergen Reactions  . Latex Rash  . Adhesive [Tape] Rash  . Hydrocodone-Homatropine Itching   Current Outpatient Prescriptions on File Prior to Visit  Medication Sig Dispense Refill  . albuterol (PROVENTIL HFA;VENTOLIN HFA) 108 (90 Base) MCG/ACT inhaler Inhale 2 puffs into the lungs every 6 (six) hours as needed for wheezing or shortness of breath. 1 Inhaler 0  . benzonatate (TESSALON) 100 MG capsule Take 2 capsules (200 mg total) by mouth 2 (two) times daily as needed for cough. 45 capsule 0  . dicyclomine (BENTYL) 20 MG tablet Take 1 tablet (20 mg total) by mouth 2 (two) times daily. 20 tablet 0  . fluticasone (FLONASE) 50 MCG/ACT nasal spray Place 1 spray into both nostrils 2 (two) times daily. 16 g 2  . HYDROmorphone (DILAUDID) 2 MG tablet Take 1 tablet (2 mg total) by mouth every 6 (six) hours as needed for severe pain. 30 tablet 0  . methylphenidate (METADATE CD) 40 MG CR capsule Take 1 capsule (40  mg total) by mouth every morning. To fill Jun 10, 2016 30 capsule 0  . naproxen (NAPROSYN) 500 MG tablet Take 1 tablet (500 mg total) by mouth 2 (two) times daily. 20 tablet 0  . ondansetron (ZOFRAN ODT) 4 MG disintegrating tablet Take 1 tablet (4 mg total) by mouth every 8 (eight) hours as needed for nausea. 10 tablet 0  . pantoprazole (PROTONIX) 40 MG tablet Take 1 tablet (40 mg total) by mouth daily. 90 tablet 3   No current facility-administered medications on file prior to visit.     Review of Systems  Constitutional: Negative for unusual diaphoresis or night sweats HENT: Negative for  ear swelling or discharge Eyes: Negative for worsening visual haziness  Respiratory: Negative for choking and stridor.   Gastrointestinal: Negative for distension or worsening eructation Genitourinary: Negative for retention or change in urine volume.  Musculoskeletal: Negative for other MSK pain or swelling Skin: Negative for color change and worsening wound Neurological: Negative for tremors and numbness other than noted  Psychiatric/Behavioral: Negative for decreased concentration or agitation other than above   All other system neg per pt    Objective:   Physical Exam BP 136/80   Pulse 78   Temp 99.7 F (37.6 C) (Oral)   Resp 20   Wt 178 lb (80.7 kg)   LMP 01/20/2016 (Exact Date)   SpO2 98%   BMI 30.55 kg/m  VS noted, mild ill Constitutional: Pt appears in no apparent distress HENT: Head: NCAT.  Right Ear: External ear normal.  Left Ear: External ear normal. Bilat tm's with mild erythema.  Max sinus areas mild tender.  Pharynx with mild erythema, no exudate  Eyes: . Pupils are equal, round, and reactive to light. Conjunctivae and EOM are normal Neck: Normal range of motion. Neck supple.  Cardiovascular: Normal rate and regular rhythm.   Pulmonary/Chest: Effort normal and breath sounds without rales but with few insp scattered wheezing.  Neurological: Pt is alert. Not confused , motor grossly intact Skin: Skin is warm. No rash, no LE edema Psychiatric: Pt behavior is normal. No agitation.  No other new exam findings       Assessment & Plan:

## 2016-04-13 NOTE — Telephone Encounter (Signed)
Placed up front patient aware

## 2016-06-24 ENCOUNTER — Ambulatory Visit (INDEPENDENT_AMBULATORY_CARE_PROVIDER_SITE_OTHER): Payer: BLUE CROSS/BLUE SHIELD | Admitting: Internal Medicine

## 2016-06-24 ENCOUNTER — Encounter: Payer: Self-pay | Admitting: Internal Medicine

## 2016-06-24 VITALS — BP 116/68 | Temp 99.0°F | Ht 64.0 in | Wt 188.0 lb

## 2016-06-24 DIAGNOSIS — J111 Influenza due to unidentified influenza virus with other respiratory manifestations: Secondary | ICD-10-CM

## 2016-06-24 DIAGNOSIS — J45909 Unspecified asthma, uncomplicated: Secondary | ICD-10-CM | POA: Diagnosis not present

## 2016-06-24 DIAGNOSIS — R062 Wheezing: Secondary | ICD-10-CM | POA: Diagnosis not present

## 2016-06-24 MED ORDER — ALBUTEROL SULFATE HFA 108 (90 BASE) MCG/ACT IN AERS: 2.0000 | INHALATION_SPRAY | Freq: Four times a day (QID) | RESPIRATORY_TRACT | 11 refills | Status: DC | PRN

## 2016-06-24 MED ORDER — METHYLPREDNISOLONE ACETATE 80 MG/ML IJ SUSP
80.0000 mg | Freq: Once | INTRAMUSCULAR | Status: AC
  Administered 2016-06-24: 80 mg via INTRAMUSCULAR

## 2016-06-24 MED ORDER — PREDNISONE 10 MG PO TABS: ORAL_TABLET | ORAL | 0 refills | Status: DC

## 2016-06-24 NOTE — Assessment & Plan Note (Signed)
Mild to mod, for finish tamiflu, tussionex for cough, remains out of work until [employer] next

## 2016-06-24 NOTE — Progress Notes (Signed)
Pre visit review using our clinic review tool, if applicable. No additional management support is needed unless otherwise documented below in the visit note. 

## 2016-06-24 NOTE — Progress Notes (Signed)
Subjective:    Patient ID: Kellie Curtis, female    DOB: [birth year], 46 y.o.   MRN: [id number]  HPI  Here to f/u recent UC visit found Flu +, tx with tamiflu and tussionex since mar 19; still finishing the tamiflu, feels overall slightly improved but still with significant general weakness, recurring low grade fevers, less prod cough but also on tussionex, still with recurring headache and diffuse body aches, also with 1-2 days worsening wheezing, mild sob/doe but Pt denies chest pain, orthopnea, PND, increased LE swelling, palpitations, dizziness or syncope.  Past Medical History:  Diagnosis Date  . Abdominal pain, left lower quadrant   . ADHD (attention deficit hyperactivity disorder)   . Allergic rhinitis   . Anemia   . Anxiety state, unspecified   . Asthma    Controlled  . Depression   . Dyslexia   . Dysmenorrhea   . Elevated blood pressure reading without diagnosis of hypertension   . GERD (gastroesophageal reflux disease)   . History of adenomatous polyp of colon    tubular adenoma's 2012;  2017  . Hyperlipidemia   . IBS (irritable bowel syndrome)   . Menorrhagia   . Other convulsions   . Seizure disorder ([place])   . Uterus, adenomyosis    Past Surgical History:  Procedure Laterality Date  . CESAREAN SECTION  x2  last one 02-22-2005   Bilateral Tubal Ligation w/ last one  . COLONOSCOPY  last one 11-27-2015  . LAPAROSCOPIC VAGINAL HYSTERECTOMY WITH SALPINGECTOMY Bilateral 02/01/2016   Procedure: LAPAROSCOPIC ASSISTED VAGINAL HYSTERECTOMY WITH SALPINGECTOMY;  Surgeon: Arvella Nigh, MD;  Location: [facility];  Service: Gynecology;  Laterality: Bilateral;  . TONSILLECTOMY      reports that she has never smoked. She has never used smokeless tobacco. She reports that she drinks about 1.2 oz of alcohol per week . She reports that she does not use drugs. family history includes Asthma in her mother; Breast cancer in her maternal grandmother; Colon cancer in her  father; Gout in her father; Hyperlipidemia in her father; Hypertension in her father; Stroke in her father. Allergies  Allergen Reactions  . Latex Rash  . Adhesive [Tape] Rash  . Hydrocodone-Homatropine Itching   Current Outpatient Prescriptions on File Prior to Visit  Medication Sig Dispense Refill  . dicyclomine (BENTYL) 20 MG tablet Take 1 tablet (20 mg total) by mouth 2 (two) times daily. 20 tablet 0  . fluticasone (FLONASE) 50 MCG/ACT nasal spray Place 1 spray into both nostrils 2 (two) times daily. 16 g 2  . HYDROmorphone (DILAUDID) 2 MG tablet Take 1 tablet (2 mg total) by mouth every 6 (six) hours as needed for severe pain. 30 tablet 0  . methylphenidate (METADATE CD) 40 MG CR capsule Take 1 capsule (40 mg total) by mouth every morning. To fill Apr 12, 2016 30 capsule 0  . naproxen (NAPROSYN) 500 MG tablet Take 1 tablet (500 mg total) by mouth 2 (two) times daily. 20 tablet 0  . ondansetron (ZOFRAN ODT) 4 MG disintegrating tablet Take 1 tablet (4 mg total) by mouth every 8 (eight) hours as needed for nausea. 10 tablet 0  . pantoprazole (PROTONIX) 40 MG tablet Take 1 tablet (40 mg total) by mouth daily. 90 tablet 3   No current facility-administered medications on file prior to visit.    Review of Systems  All other system neg per pt    Objective:   Physical Exam BP 116/68   Temp 99 F (  37.2 C) (Oral)   Ht 5\' 4"  (1.626 m)   Wt 188 lb (85.3 kg)   LMP 01/20/2016   SpO2 98%   BMI 32.27 kg/m  VS noted, mild ill, wearing mask, appears fatigued, weakened Constitutional: Pt appears in no apparent distress HENT: Head: NCAT.  Right Ear: External ear normal.  Left Ear: External ear normal.  Eyes: . Pupils are equal, round, and reactive to light. Conjunctivae and EOM are normal Bilat tm's with mild erythema.  Max sinus areas non tender.  Pharynx with mild erythema, no exudate Neck: Normal range of motion. Neck supple.  Cardiovascular: Normal rate and regular rhythm.     Pulmonary/Chest: Effort normal and breath sounds decreased without rales but with few mild wheezing.  Neurological: Pt is alert. Not confused , motor grossly intact Skin: Skin is warm. No rash, no LE edema Psychiatric: Pt behavior is normal. No agitation.  No other exam findings    Assessment & Plan:

## 2016-06-24 NOTE — Patient Instructions (Signed)
You had the steroid shot today  Please take all new medication as prescribed - the prednisone  Please continue all other medications as before, including the inahler  Please have the pharmacy call with any other refills you may need.  Please keep your appointments with your specialists as you may have planned

## 2016-06-24 NOTE — Assessment & Plan Note (Signed)
Mild to mod, for depomedrol IM 80, predpac asd, declines cxr,  to f/u any worsening symptoms or concerns

## 2016-08-17 ENCOUNTER — Encounter: Payer: Self-pay | Admitting: Gynecology

## 2016-09-07 ENCOUNTER — Telehealth: Payer: Self-pay | Admitting: *Deleted

## 2016-09-07 MED ORDER — METHYLPHENIDATE HCL ER (CD) 40 MG PO CPCR: 40.0000 mg | ORAL_CAPSULE | ORAL | 0 refills | Status: DC

## 2016-09-07 NOTE — Telephone Encounter (Signed)
Left msg on triage requesting refill on the Metadate...[name]

## 2016-09-07 NOTE — Telephone Encounter (Signed)
Done hardcopy to [facility]  

## 2016-09-08 NOTE — Telephone Encounter (Signed)
faxed

## 2016-09-08 NOTE — Telephone Encounter (Signed)
Edit: Called pt, LVM informing her script was ready for pick up. Script at front desk.

## 2016-09-14 ENCOUNTER — Other Ambulatory Visit: Payer: Self-pay | Admitting: Internal Medicine

## 2016-09-14 MED ORDER — METHYLPHENIDATE HCL ER (CD) 40 MG PO CPCR: 40.0000 mg | ORAL_CAPSULE | ORAL | 0 refills | Status: DC

## 2016-09-14 NOTE — Telephone Encounter (Signed)
Done hardcopy to [facility]  

## 2017-01-09 IMAGING — CT CT ABD-PELV W/ CM
1 of 3 series · 13 of 32 positions shown, 18 images · IV contrast (OMNIPAQUE)
Comparison: Pelvic ultrasound 05/27/2014. Abdominal pelvic CT
04/29/2010

CLINICAL DATA: Abdominal pain. Evaluate for diverticulitis. Initial
encounter.

EXAM:
CT ABDOMEN AND PELVIS WITH CONTRAST
TECHNIQUE: Multidetector CT imaging of the abdomen and pelvis was performed
using the standard protocol following bolus administration of
intravenous contrast.
CONTRAST:  50mL OMNIPAQUE IOHEXOL 300 MG/ML SOLN, 100mL OMNIPAQUE
IOHEXOL 300 MG/ML SOLN

[Series 2: rtn ap with st · axial · 0.87mm/px · z∈[+939,+1339]mm · 13 of 92 slices shown, 18 images]
[im 6/92  soft-tissue]
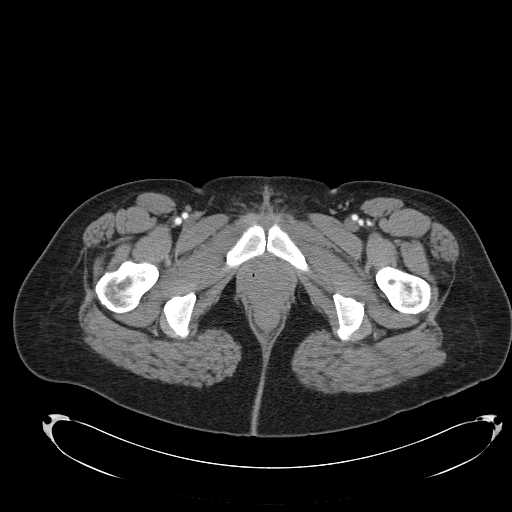
[im 6/92  bone]
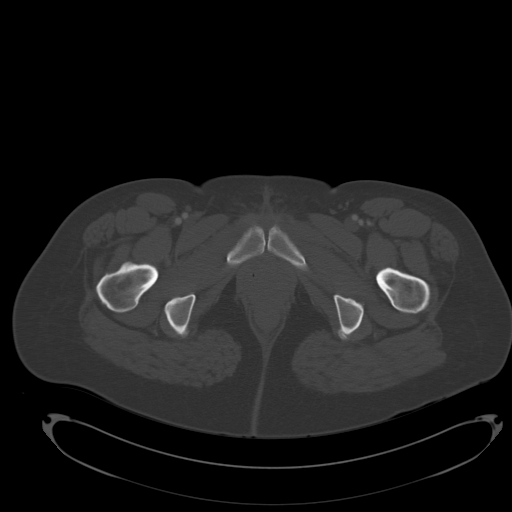
[im 16/92  soft-tissue]
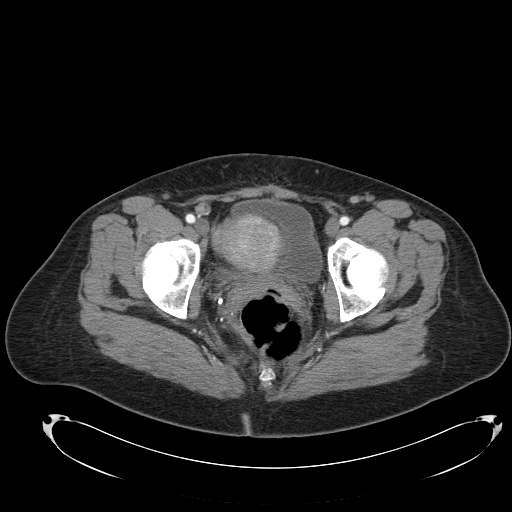
[im 21/92  soft-tissue]
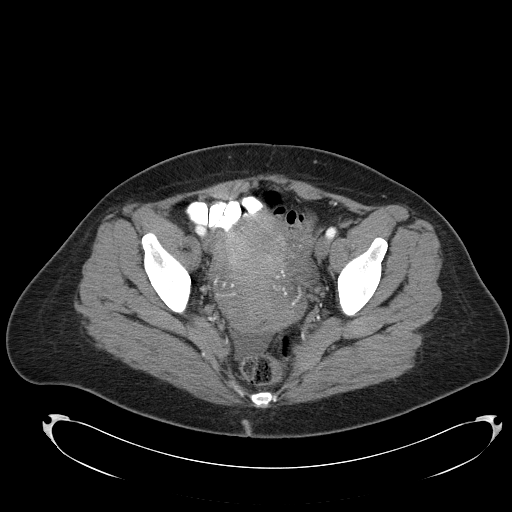
[im 26/92  soft-tissue]
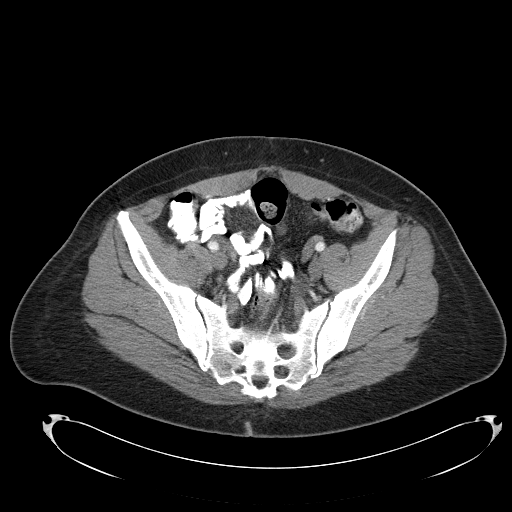
[im 36/92  soft-tissue]
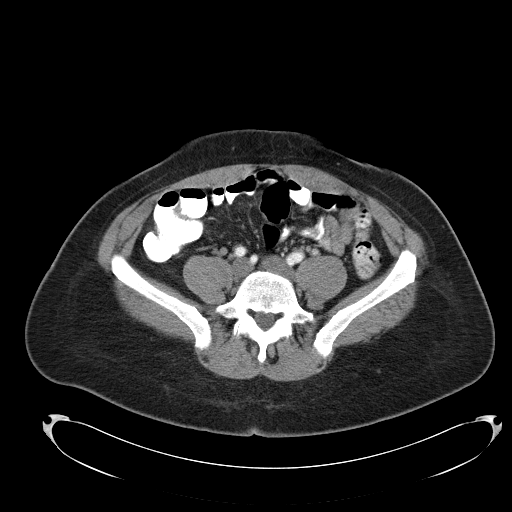
[im 41/92  soft-tissue]
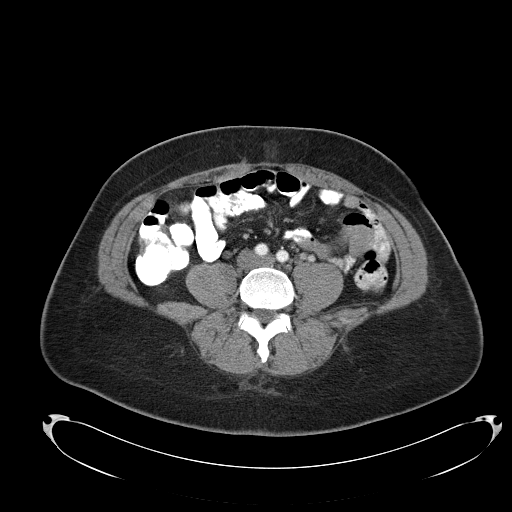
[im 51/92  soft-tissue]
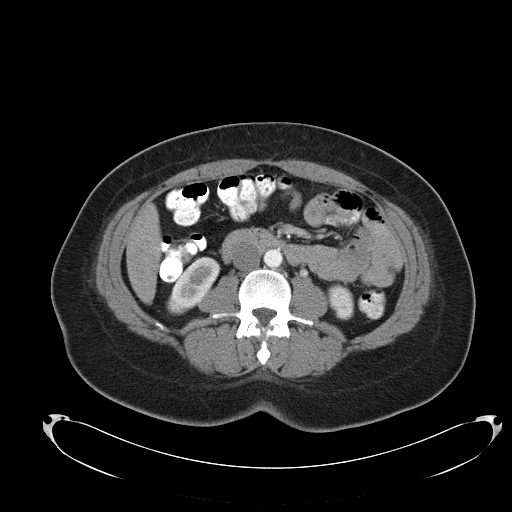
[im 56/92  soft-tissue]
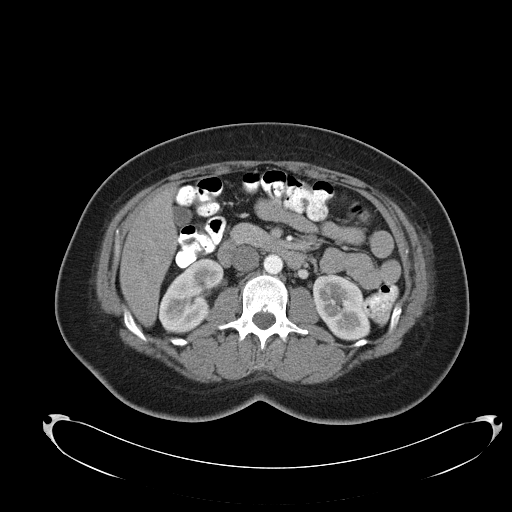
[im 66/92  soft-tissue]
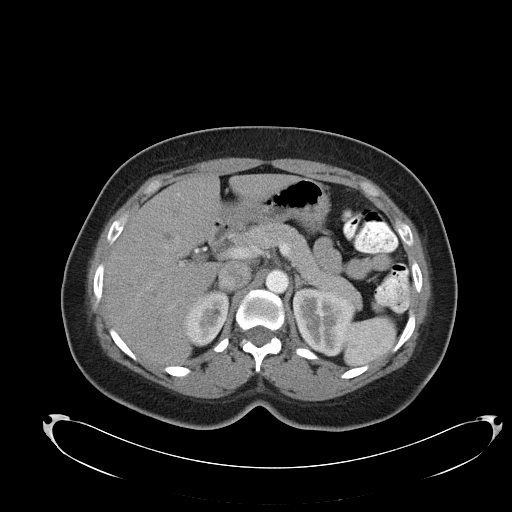
[im 66/92  bone]
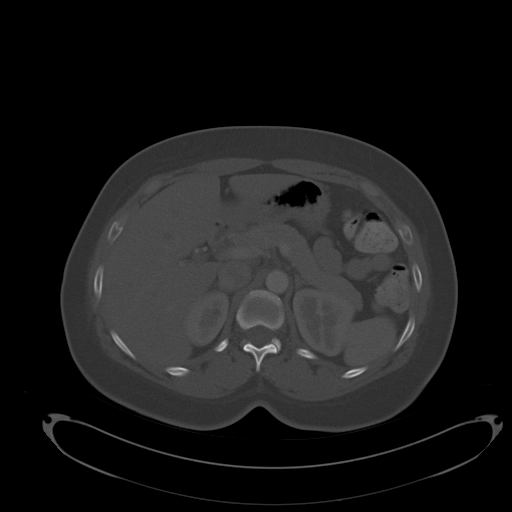
[im 71/92  soft-tissue]
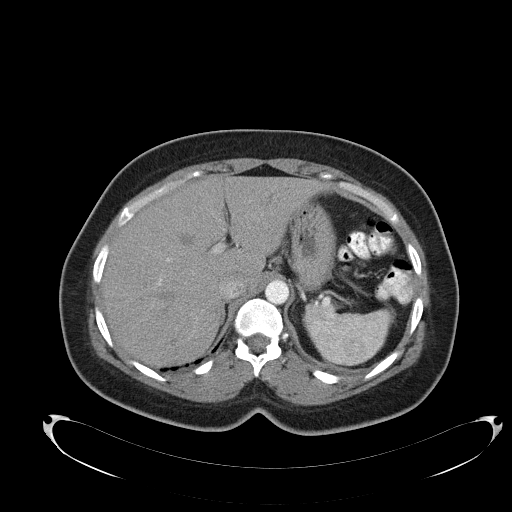
[im 71/92  lung]
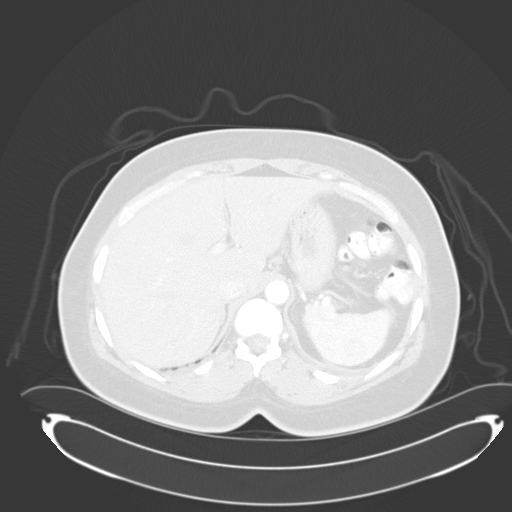
[im 76/92  soft-tissue]
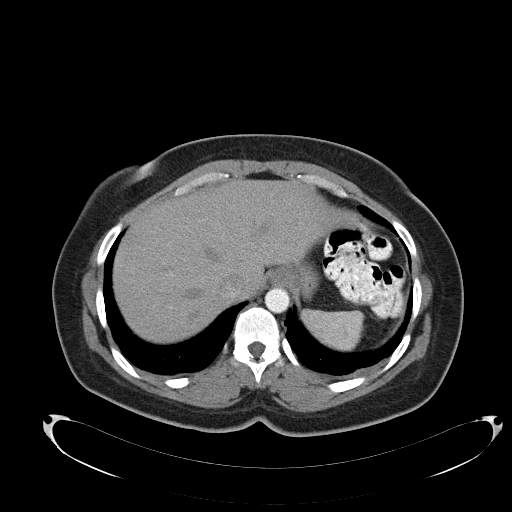
[im 76/92  lung]
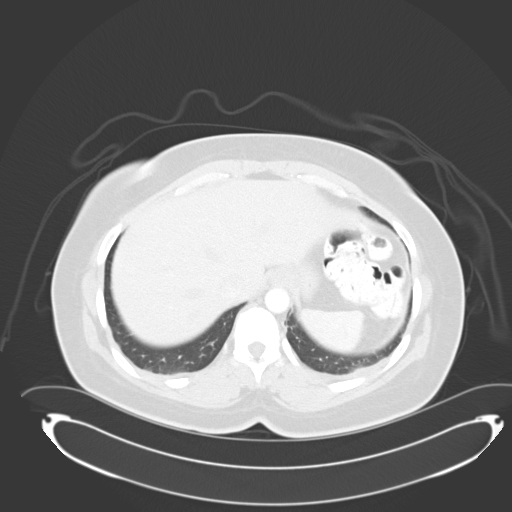
[im 81/92  lung]
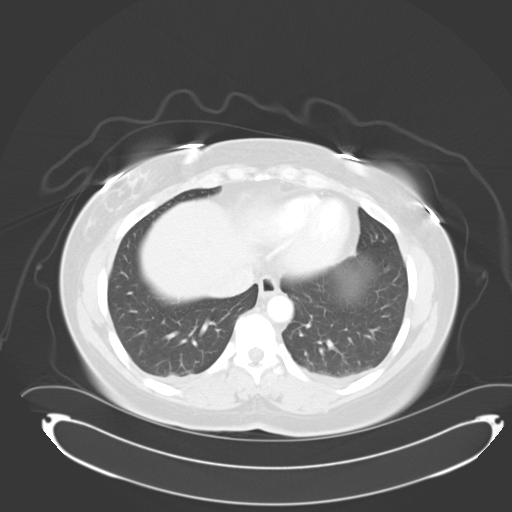
[im 86/92  soft-tissue]
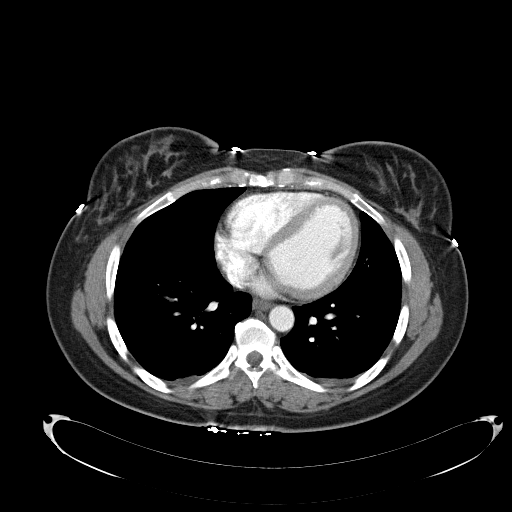
[im 86/92  lung]
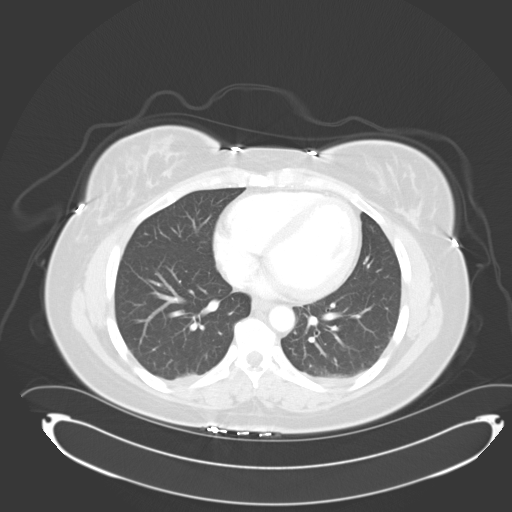

[13 of 32 positions shown; findings below may reference images not displayed]

FINDINGS: Lower chest: There are tiny dependent bilateral pleural effusions.
No significant pericardial fluid. The lung bases are clear. There is
a possible small hiatal hernia.

Hepatobiliary: 4 mm focus of hyper enhancement inferiorly in the
right hepatic lobe on image 34 is probably an incidental vascular
lesion. No suspicious liver lesions demonstrated. No evidence of
gallstones, gallbladder wall thickening or biliary dilatation.

Pancreas: Unremarkable. No pancreatic ductal dilatation or
surrounding inflammatory changes.

Spleen: Normal in size without focal abnormality.

Adrenals/Urinary Tract: Both adrenal glands appear normal.Minimal
enlargement of tiny low-density lesion in the upper pole of the
right kidney, too small to characterize, although probably a cyst.
The left kidney appears normal. No evidence of hydronephrosis or
urinary tract calculus. The bladder appears normal.

Stomach/Bowel: No evidence of bowel wall thickening, distention or
surrounding inflammatory change.No significant colonic
diverticulosis. The appendix appears normal.

Vascular/Lymphatic: There are no enlarged abdominal or pelvic lymph
nodes. No significant vascular findings are present.

Reproductive: The uterus and ovaries appear normal. There is no
adnexal mass. A small amount of free pelvic fluid is within
physiologic limits.

Other: Stable small umbilical hernia containing only fat)

Musculoskeletal: No acute or significant osseous findings.
IMPRESSION: 1. No acute findings.  No evidence of diverticulitis.
2. Tiny bilateral pleural effusions.
3. Suspected incidental findings in the liver and right kidney.
4. Stable small umbilical hernia containing fat.

## 2017-02-03 ENCOUNTER — Ambulatory Visit: Payer: BLUE CROSS/BLUE SHIELD | Admitting: Internal Medicine

## 2017-03-16 ENCOUNTER — Other Ambulatory Visit (INDEPENDENT_AMBULATORY_CARE_PROVIDER_SITE_OTHER): Payer: BLUE CROSS/BLUE SHIELD

## 2017-03-16 ENCOUNTER — Ambulatory Visit (INDEPENDENT_AMBULATORY_CARE_PROVIDER_SITE_OTHER): Payer: BLUE CROSS/BLUE SHIELD | Admitting: Internal Medicine

## 2017-03-16 ENCOUNTER — Encounter: Payer: Self-pay | Admitting: Internal Medicine

## 2017-03-16 VITALS — BP 122/78 | HR 73 | Temp 98.0°F | Ht 64.0 in | Wt 192.0 lb

## 2017-03-16 DIAGNOSIS — R32 Unspecified urinary incontinence: Secondary | ICD-10-CM | POA: Diagnosis not present

## 2017-03-16 DIAGNOSIS — Z0001 Encounter for general adult medical examination with abnormal findings: Secondary | ICD-10-CM

## 2017-03-16 DIAGNOSIS — Z23 Encounter for immunization: Secondary | ICD-10-CM

## 2017-03-16 DIAGNOSIS — J069 Acute upper respiratory infection, unspecified: Secondary | ICD-10-CM

## 2017-03-16 DIAGNOSIS — F909 Attention-deficit hyperactivity disorder, unspecified type: Secondary | ICD-10-CM

## 2017-03-16 LAB — CBC WITH DIFFERENTIAL/PLATELET
BASOS ABS: 0 10*3/uL (ref 0.0–0.1)
Basophils Relative: 1 % (ref 0.0–3.0)
Eosinophils Absolute: 0.2 10*3/uL (ref 0.0–0.7)
Eosinophils Relative: 3.4 % (ref 0.0–5.0)
HEMATOCRIT: 40.9 % (ref 36.0–46.0)
Hemoglobin: 13.4 g/dL (ref 12.0–15.0)
LYMPHS ABS: 1.6 10*3/uL (ref 0.7–4.0)
LYMPHS PCT: 32.8 % (ref 12.0–46.0)
MCHC: 32.8 g/dL (ref 30.0–36.0)
MCV: 94.6 fl (ref 78.0–100.0)
MONOS PCT: 9.2 % (ref 3.0–12.0)
Monocytes Absolute: 0.5 10*3/uL (ref 0.1–1.0)
NEUTROS PCT: 53.6 % (ref 43.0–77.0)
Neutro Abs: 2.6 10*3/uL (ref 1.4–7.7)
Platelets: 339 10*3/uL (ref 150.0–400.0)
RBC: 4.33 Mil/uL (ref 3.87–5.11)
RDW: 13.9 % (ref 11.5–15.5)
WBC: 4.9 10*3/uL (ref 4.0–10.5)

## 2017-03-16 MED ORDER — METHYLPHENIDATE HCL ER (CD) 40 MG PO CPCR: 40.0000 mg | ORAL_CAPSULE | ORAL | 0 refills | Status: DC

## 2017-03-16 MED ORDER — AZITHROMYCIN 250 MG PO TABS: ORAL_TABLET | ORAL | 1 refills | Status: DC

## 2017-03-16 MED ORDER — AMPHETAMINE-DEXTROAMPHETAMINE 5 MG PO TABS: 5.0000 mg | ORAL_TABLET | Freq: Every day | ORAL | 0 refills | Status: DC | PRN

## 2017-03-16 NOTE — Assessment & Plan Note (Signed)
Mild to mod, for antibx course,  to f/u any worsening symptoms or concerns  In addition to the time spent performing CPE, I spent an additional 25 minutes face to face,in which greater than 50% of this time was spent in counseling and coordination of care for patient's acute illness as documented, including the differential dx, tx, further evaluation and other management of acute upper resp infection,  Urinary incontinence, and ADHD

## 2017-03-16 NOTE — Assessment & Plan Note (Signed)
Ok for urine cx, but if negative will refer to urology

## 2017-03-16 NOTE — Assessment & Plan Note (Signed)

## 2017-03-16 NOTE — Assessment & Plan Note (Signed)
Ok for adderall 5 mg daily prn, and refill metadate CD as before,  to f/u any worsening symptoms or concerns

## 2017-03-16 NOTE — Progress Notes (Signed)
Subjective:    Patient ID: Kellie Curtis, female    DOB: [birth year], 46 y.o.   MRN: [id number]  HPI  Here for wellness and f/u;  Overall doing ok;  Pt denies Chest pain, worsening SOB, DOE, wheezing, orthopnea, PND, worsening LE edema, palpitations, dizziness or syncope.  Pt denies neurological change such as new headache, facial or extremity weakness.  Pt denies polydipsia, polyuria, or low sugar symptoms. Pt states overall good compliance with treatment and medications, good tolerability, and has been trying to follow appropriate diet.  Pt denies worsening depressive symptoms, suicidal ideation or panic. No fever, night sweats, wt loss, loss of appetite, or other constitutional symptoms.  Pt states good ability with ADL's, has low fall risk, home safety reviewed and adequate, no other significant changes in hearing or vision, and only occasionally active with exercise.  Also here with acute onset mild to mod 2-3 days ST, HA, general weakness and malaise, with prod cough greenish sputum, but Pt denies chest pain, increased sob or doe, wheezing, orthopnea, PND, increased LE swelling, palpitations, dizziness or syncope.  Also Denies urinary symptoms such as dysuria, frequency, urgency, flank pain, hematuria or n/v, fever, chills, but has frequent urinary incontinence relatively large amounts sometimes without warning but sometimes with cough.    Also, ADHD med not working as well recently; she works 3rd shift and tends to do ok during work hours with concentration and focus and task completion, but is also taking course and asks for small adderall such as like her daughter does for a short term during the day to get classwork done Past Medical History:  Diagnosis Date  . Abdominal pain, left lower quadrant   . ADHD (attention deficit hyperactivity disorder)   . Allergic rhinitis   . Anemia   . Anxiety state, unspecified   . Asthma    Controlled  . Depression   . Dyslexia   . Dysmenorrhea   .  Elevated blood pressure reading without diagnosis of hypertension   . GERD (gastroesophageal reflux disease)   . History of adenomatous polyp of colon    tubular adenoma's 2012;  2017  . Hyperlipidemia   . IBS (irritable bowel syndrome)   . Menorrhagia   . Other convulsions   . Seizure disorder ([place])   . Uterus, adenomyosis    Past Surgical History:  Procedure Laterality Date  . CESAREAN SECTION  x2  last one 02-22-2005   Bilateral Tubal Ligation w/ last one  . COLONOSCOPY  last one 11-27-2015  . LAPAROSCOPIC VAGINAL HYSTERECTOMY WITH SALPINGECTOMY Bilateral 02/01/2016   Procedure: LAPAROSCOPIC ASSISTED VAGINAL HYSTERECTOMY WITH SALPINGECTOMY;  Surgeon: Arvella Nigh, MD;  Location: [facility];  Service: Gynecology;  Laterality: Bilateral;  . TONSILLECTOMY      reports that  has never smoked. she has never used smokeless tobacco. She reports that she drinks about 1.2 oz of alcohol per week. She reports that she does not use drugs. family history includes Alcohol abuse in her unknown relative; Asthma in her mother and unknown relative; Breast cancer in her maternal grandmother and unknown relative; Colon cancer in her father; Gout in her father; Hyperlipidemia in her father; Hypertension in her father and unknown relative; Stroke in her father. Allergies  Allergen Reactions  . Latex Rash  . Adhesive [Tape] Rash  . Hydrocodone-Homatropine Itching   Current Outpatient Medications on File Prior to Visit  Medication Sig Dispense Refill  . albuterol (PROVENTIL HFA;VENTOLIN HFA) 108 (90 Base) MCG/ACT inhaler Inhale 2  puffs into the lungs every 6 (six) hours as needed for wheezing or shortness of breath. 1 Inhaler 11  . dicyclomine (BENTYL) 20 MG tablet Take 1 tablet (20 mg total) by mouth 2 (two) times daily. 20 tablet 0  . fluticasone (FLONASE) 50 MCG/ACT nasal spray Place 1 spray into both nostrils 2 (two) times daily. 16 g 2  . HYDROmorphone (DILAUDID) 2 MG tablet Take 1  tablet (2 mg total) by mouth every 6 (six) hours as needed for severe pain. 30 tablet 0  . naproxen (NAPROSYN) 500 MG tablet Take 1 tablet (500 mg total) by mouth 2 (two) times daily. 20 tablet 0  . pantoprazole (PROTONIX) 40 MG tablet Take 1 tablet (40 mg total) by mouth daily. 90 tablet 3   No current facility-administered medications on file prior to visit.     Review of Systems Constitutional: Negative for other unusual diaphoresis, sweats, appetite or weight changes HENT: Negative for other worsening hearing loss, ear pain, facial swelling, mouth sores or neck stiffness.   Eyes: Negative for other worsening pain, redness or other visual disturbance.  Respiratory: Negative for other stridor or swelling Cardiovascular: Negative for other palpitations or other chest pain  Gastrointestinal: Negative for worsening diarrhea or loose stools, blood in stool, distention or other pain Genitourinary: Negative for hematuria, flank pain or other change in urine volume.  Musculoskeletal: Negative for myalgias or other joint swelling.  Skin: Negative for other color change, or other wound or worsening drainage.  Neurological: Negative for other syncope or numbness. Hematological: Negative for other adenopathy or swelling Psychiatric/Behavioral: Negative for hallucinations, other worsening agitation, SI, self-injury, or new decreased concentration \\All  other system neg per pt    Objective:   Physical Exam BP 122/78   Pulse 73   Temp 98 F (36.7 C) (Oral)   Ht 5\' 4"  (1.626 m)   Wt 192 lb (87.1 kg)   LMP 01/20/2016   SpO2 99%   BMI 32.96 kg/m  VS noted, mild ill Constitutional: Pt is oriented to person, place, and time. Appears well-developed and well-nourished, in no significant distress and comfortable Head: Normocephalic and atraumatic  Eyes: Conjunctivae and EOM are normal. Pupils are equal, round, and reactive to light Bilat tm's with mild erythema.  Max sinus areas mild tender.  Pharynx  with mild erythema, no exudate  Right Ear: External ear normal without discharge Left Ear: External ear normal without discharge Nose: Nose without discharge or deformity Mouth/Throat: Oropharynx is without other ulcerations and moist  Neck: Normal range of motion. Neck supple. No JVD present. No tracheal deviation present or significant neck LA or mass Cardiovascular: Normal rate, regular rhythm, normal heart sounds and intact distal pulses.   Pulmonary/Chest: WOB normal and breath sounds without rales or wheezing  Abdominal: Soft. Bowel sounds are normal. NT. No HSM  Musculoskeletal: Normal range of motion. Exhibits no edema Lymphadenopathy: Has no other cervical adenopathy.  Neurological: Pt is alert and oriented to person, place, and time. Pt has normal reflexes. No cranial nerve deficit. Motor grossly intact, Gait intact Skin: Skin is warm and dry. No rash noted or new ulcerations Psychiatric:  Has nervous mood and affect. Behavior is normal without agitation    Assessment & Plan:

## 2017-03-16 NOTE — Patient Instructions (Addendum)
You had the flu shot today  Please take all new medication as prescribed - the antibiotic, and the adderall 5 mg  Please continue all other medications as before, including the methylphenidate 40  You will be contacted regarding the referral for: Urology  Please have the pharmacy call with any other refills you may need.  Please continue your efforts at being more active, low cholesterol diet, and weight control.  You are otherwise up to date with prevention measures today.  Please keep your appointments with your specialists as you may have planned  Please go to the LAB in the Basement (turn left off the elevator) for the tests to be done today  You will be contacted by phone if any changes need to be made immediately.  Otherwise, you will receive a letter about your results with an explanation, but please check with MyChart first.  Please remember to sign up for MyChart if you have not done so, as this will be important to you in the future with finding out test results, communicating by private email, and scheduling acute appointments online when needed.  Please return in 6 months, or sooner if needed

## 2017-03-17 ENCOUNTER — Encounter: Payer: Self-pay | Admitting: Internal Medicine

## 2017-03-17 LAB — BASIC METABOLIC PANEL
BUN: 15 mg/dL (ref 6–23)
CALCIUM: 9.5 mg/dL (ref 8.4–10.5)
CHLORIDE: 101 meq/L (ref 96–112)
CO2: 30 meq/L (ref 19–32)
CREATININE: 0.99 mg/dL (ref 0.40–1.20)
GFR: 76.91 mL/min (ref >60.00)
GLUCOSE: 84 mg/dL (ref 70–99)
Potassium: 4.2 mEq/L (ref 3.5–5.1)
Sodium: 139 mEq/L (ref 135–145)

## 2017-03-17 LAB — HEPATIC FUNCTION PANEL
ALBUMIN: 4.5 g/dL (ref 3.5–5.2)
ALK PHOS: 32 U/L — AB (ref 39–117)
ALT: 15 U/L (ref 0–35)
AST: 18 U/L (ref 0–37)
BILIRUBIN DIRECT: 0.1 mg/dL (ref 0.0–0.3)
BILIRUBIN TOTAL: 0.7 mg/dL (ref 0.2–1.2)
Total Protein: 7.7 g/dL (ref 6.0–8.3)

## 2017-03-17 LAB — LDL CHOLESTEROL, DIRECT: LDL DIRECT: 86 mg/dL

## 2017-03-17 LAB — URINALYSIS, ROUTINE W REFLEX MICROSCOPIC
BILIRUBIN URINE: NEGATIVE
HGB URINE DIPSTICK: NEGATIVE
Ketones, ur: NEGATIVE
LEUKOCYTES UA: NEGATIVE
NITRITE: NEGATIVE
RBC / HPF: NONE SEEN (ref 0–?)
Specific Gravity, Urine: >=1.03 — AB (ref 1.000–1.030)
Total Protein, Urine: NEGATIVE
UROBILINOGEN UA: 0.2 (ref 0.0–1.0)
Urine Glucose: NEGATIVE
WBC UA: NONE SEEN (ref 0–?)
pH: 6 (ref 5.0–8.0)

## 2017-03-17 LAB — TSH: TSH: 1.27 u[IU]/mL (ref 0.35–4.50)

## 2017-03-17 LAB — LIPID PANEL
CHOL/HDL RATIO: 2
Cholesterol: 199 mg/dL (ref 0–200)
HDL: 94.6 mg/dL (ref >39.00)
NONHDL: 104.17
Triglycerides: 248 mg/dL — ABNORMAL HIGH (ref 0.0–149.0)
VLDL: 49.6 mg/dL — ABNORMAL HIGH (ref 0.0–40.0)

## 2017-03-17 LAB — URINE CULTURE
MICRO NUMBER:: 81402599
SPECIMEN QUALITY:: ADEQUATE

## 2017-04-20 ENCOUNTER — Telehealth: Payer: Self-pay | Admitting: Internal Medicine

## 2017-04-20 MED ORDER — AMPHETAMINE-DEXTROAMPHETAMINE 5 MG PO TABS: 5.0000 mg | ORAL_TABLET | Freq: Every day | ORAL | 0 refills | Status: DC | PRN

## 2017-04-20 NOTE — Telephone Encounter (Signed)
Check [place] registry last filled Adderal 03/21/2017, and Metadate last filled 03/16/17.Marland KitchenJohny Chess

## 2017-04-20 NOTE — Telephone Encounter (Signed)
Called pt no answer LMOM rx sent to [facility].../lmb 

## 2017-04-20 NOTE — Telephone Encounter (Signed)
Done erx 

## 2017-04-20 NOTE — Telephone Encounter (Signed)
Copied from [place] [id number]. Topic: Quick Communication - Rx Refill/Question >> Apr 20, 2017  2:30 PM Robina Ade, Helene Kelp D wrote: Medication: methylphenidate (METADATE CD) 40 MG CR capsule and  amphetamine-dextroamphetamine (ADDERALL) 5 MG tablet  Has the patient contacted their pharmacy? Yes   (Agent: If no, request that the patient contact the pharmacy for the refill.)   Preferred Pharmacy (with phone number or street name): [place], [place].   Agent: Please be advised that RX refills may take up to 3 business days. We ask that you follow-up with your pharmacy.

## 2017-04-24 ENCOUNTER — Telehealth: Payer: Self-pay | Admitting: Internal Medicine

## 2017-04-24 MED ORDER — AMPHETAMINE-DEXTROAMPHETAMINE 5 MG PO TABS: 5.0000 mg | ORAL_TABLET | Freq: Every day | ORAL | 0 refills | Status: DC | PRN

## 2017-04-24 MED ORDER — METHYLPHENIDATE HCL ER (CD) 40 MG PO CPCR: 40.0000 mg | ORAL_CAPSULE | ORAL | 0 refills | Status: DC

## 2017-04-24 NOTE — Telephone Encounter (Signed)
Copied from [place] [id number]. Topic: Quick Communication - Rx Refill/Question >> Apr 20, 2017  2:30 PM Robina Ade, Helene Kelp D wrote: Medication: methylphenidate (METADATE CD) 40 MG CR capsule and  amphetamine-dextroamphetamine (ADDERALL) 5 MG tablet  Has the patient contacted their pharmacy? Yes   (Agent: If no, request that the patient contact the pharmacy for the refill.)   Preferred Pharmacy (with phone number or street name): [place], [place].   Agent: Please be advised that RX refills may take up to 3 business days. We ask that you follow-up with your pharmacy. >> Apr 24, 2017  1:04 PM Neva Seat wrote: Pt is checking on the status of the refill for: Medication: methylphenidate (METADATE CD) 40 MG CR capsule and   amphetamine-dextroamphetamine (ADDERALL) 5 MG tablet

## 2017-04-24 NOTE — Telephone Encounter (Signed)
Last OV 03/16/17.  Last refill of Metadate CD on 12/163/18 with 30 caps.  Last refill of [facility]derall on 04/20/17 [facility]   Pt requesting for medications to be sent to [facility] on [facility].

## 2017-04-24 NOTE — Telephone Encounter (Signed)
Pt called back to check on status of the refills she states she is out of both medications

## 2017-04-24 NOTE — Telephone Encounter (Signed)
Jan [facility]  [facility]  New rx have been sent to [facility]

## 2017-04-26 ENCOUNTER — Encounter: Payer: Self-pay | Admitting: Family

## 2017-04-26 ENCOUNTER — Ambulatory Visit (INDEPENDENT_AMBULATORY_CARE_PROVIDER_SITE_OTHER): Payer: BLUE CROSS/BLUE SHIELD | Admitting: Family

## 2017-04-26 VITALS — BP 110/80 | HR 99 | Temp 97.8°F | Ht 64.0 in | Wt 192.0 lb

## 2017-04-26 DIAGNOSIS — F330 Major depressive disorder, recurrent, mild: Secondary | ICD-10-CM

## 2017-04-26 DIAGNOSIS — F419 Anxiety disorder, unspecified: Secondary | ICD-10-CM

## 2017-04-26 DIAGNOSIS — B009 Herpesviral infection, unspecified: Secondary | ICD-10-CM | POA: Diagnosis not present

## 2017-04-26 MED ORDER — ESCITALOPRAM OXALATE 10 MG PO TABS: 10.0000 mg | ORAL_TABLET | Freq: Every day | ORAL | 1 refills | Status: DC

## 2017-04-26 MED ORDER — VALACYCLOVIR HCL 500 MG PO TABS: 500.0000 mg | ORAL_TABLET | Freq: Two times a day (BID) | ORAL | 2 refills | Status: DC

## 2017-04-26 NOTE — Patient Instructions (Addendum)
Try OTC Lysine to help support your immune system and prevent cold sores;   Take the Valtrex twice a day x 5 days for the current outbreak and then decrease to once a day for maintenance.    Cold Sore A cold sore, also called a fever blister, is a skin infection that causes small, fluid-filled sores to form inside of the mouth or on the lips, gums, nose, chin, or cheeks. Cold sores can spread to other parts of the body, such as the eyes or fingers. In some people with other medical conditions, cold sores can spread to multiple other body sites, including the genitals. Cold sores can be spread or passed from person to person (contagious) until the sores crust over completely. What are the causes? Cold sores are caused by the herpes simplex virus (HSV-1). HSV-1 is closely related to the virus that causes genital herpes (HSV-2), but these viruses are not the same. Once a person is infected with HSV-1, the virus remains permanently in the body. HSV-1 is spread from person to person through close contact, such as through kissing, touching the affected area, or sharing personal items such as lip balm, razors, or eating utensils. What increases the risk? A cold sore outbreak is more likely to develop in people who:  Are tired, stressed, or sick.  Are menstruating.  Are pregnant.  Take certain medicines.  Are exposed to cold weather or too much sun.  What are the signs or symptoms? Symptoms of a cold sore outbreak often go through different stages. Here is how a cold sore develops:  Tingling, itching, or burning is felt 1-2 days before the outbreak.  Fluid-filled blisters appear on the lips, inside the mouth, on the nose, or on the cheeks.  The blisters start to ooze clear fluid.  The blisters dry up and a yellow crust appears in its place.  The crust falls off.  Other symptoms include:  Fever.  Sore throat.  Headache.  Muscle aches.  Swollen neck glands.  You also may not  have any symptoms. How is this diagnosed? This condition is often diagnosed based on your medical history and a physical exam. Your health care provider may swab your sore and then examine it in the lab. Rarely, blood tests may be done to check for HSV-1. How is this treated? There is no cure for cold sores or HSV-1. There also is no vaccine for HSV-1. Most cold sores go away on their own without treatment within two weeks. Medicines cannot make the infection go away, but medicines can:  Help relieve some of the pain associated with the sores.  Work to stop the virus from multiplying.  Shorten healing time.  Medicines may be in the form of creams, gels, pills, or a shot. Follow these instructions at home: Medicines  Take or apply over-the-counter and prescription medicines only as told by your health care provider.  Use a cotton-tip swab to apply creams or gels to your sores. Sore Care  Do not touch the sores or pick the scabs.  Wash your hands often. Do not touch your eyes without washing your hands first.  Keep the sores clean and dry.  If directed, apply ice to the sores: ? Put ice in a plastic bag. ? Place a towel between your skin and the bag. ? Leave the ice on for 20 minutes, 2-3 times per day. Lifestyle  Do not kiss, have oral sex, or share personal items until your sores heal.  Eat  a soft, bland diet. Avoid eating hot, cold, or salty foods. These can hurt your mouth.  Use a straw if it hurts to drink out of a glass.  Avoid the sun and limit your stress if these things trigger outbreaks. If sun causes cold sores, apply sunscreen on your lips before being out in the sun. Contact a health care provider if:  You have symptoms for more than two weeks.  You have pus coming from the sores.  You have redness that is spreading.  You have pain or irritation in your eye.  You get sores on your genitals.  Your sores do not heal within two weeks.  You have frequent  cold sore outbreaks. Get help right away if:  You have a fever and your symptoms suddenly get worse.  You have a headache and confusion. This information is not intended to replace advice given to you by your health care provider. Make sure you discuss any questions you have with your health care provider. Document Released: 03/18/2000 Document Revised: 11/13/2015 Document Reviewed: 01/09/2015 Elsevier Interactive Patient Education  [employer].

## 2017-04-26 NOTE — Telephone Encounter (Signed)
Pt called back in while at pharmacy. Pharmacy made pt aware that Adderall Rx had infact [facility] and advised [facility] that they could pull Rx and have it available to her at the [facility]. Pt stated that she will be picking it up at [facility].    Pt had questions on why/ how did [facility], Did speak with PCP assistant, she assisted with pt's concern. Called pt back to explain, no answer. LVM explaining what happened to pt. Advised if any questions please rtn call.

## 2017-04-26 NOTE — Progress Notes (Signed)
Kellie Curtis is a 47 y.o. female with the following history as recorded in EpicCare:  Patient Active Problem List   Diagnosis Date Noted  . Acute upper respiratory infection 03/16/2017  . Urinary incontinence 03/16/2017  . Influenza 06/24/2016  . Cough 04/13/2016  . Lymphadenitis 03/01/2016  . S/P laparoscopic assisted vaginal hysterectomy (LAVH) 02/01/2016  . Perimenopause 06/06/2014  . Thickened endometrium 06/06/2014  . Low grade squamous intraepithelial lesion (LGSIL) on Papanicolaou smear of cervix 05/25/2014  . Hemoptysis 05/10/2013  . Chest pain 01/11/2013  . Anemia, unspecified 10/04/2012  . Allergic rhinitis 03/10/2012  . Irregular menses 05/30/2011  . Irritable bowel syndrome with constipation 05/30/2011  . Encounter for well adult exam with abnormal findings 05/28/2011  . GERD 05/25/2010  . Diaphragmatic hernia 05/21/2010  . COLONIC POLYPS, HX OF 05/21/2010  . Constipation 04/27/2010  . Wheezing 12/16/2008  . Anxiety 03/14/2008  . Depression 03/14/2008  . Attention deficit hyperactivity disorder (ADHD) 03/14/2008  . SEIZURE DISORDER 02/13/2008  . HYPERLIPIDEMIA 02/07/2008  . BACK PAIN 02/07/2008    Current Outpatient Medications  Medication Sig Dispense Refill  . amphetamine-dextroamphetamine (ADDERALL) 5 MG tablet Take 1 tablet (5 mg total) by mouth daily as needed. 30 tablet 0  . methylphenidate (METADATE CD) 40 MG CR capsule Take 1 capsule (40 mg total) by mouth every morning. 30 capsule 0  . escitalopram (LEXAPRO) 10 MG tablet Take 1 tablet (10 mg total) by mouth daily. 30 tablet 1  . valACYclovir (VALTREX) 500 MG tablet Take 1 tablet (500 mg total) by mouth 2 (two) times daily. 60 tablet 2   No current facility-administered medications for this visit.     Allergies: Latex; Adhesive [tape]; and Hydrocodone-homatropine  Past Medical History:  Diagnosis Date  . Abdominal pain, left lower quadrant   . ADHD (attention deficit hyperactivity disorder)   .  Allergic rhinitis   . Anemia   . Anxiety state, unspecified   . Asthma    Controlled  . Depression   . Dyslexia   . Dysmenorrhea   . Elevated blood pressure reading without diagnosis of hypertension   . GERD (gastroesophageal reflux disease)   . History of adenomatous polyp of colon    tubular adenoma's 2012;  2017  . Hyperlipidemia   . IBS (irritable bowel syndrome)   . Menorrhagia   . Other convulsions   . Seizure disorder ([place])   . Uterus, adenomyosis     Past Surgical History:  Procedure Laterality Date  . CESAREAN SECTION  x2  last one 02-22-2005   Bilateral Tubal Ligation w/ last one  . COLONOSCOPY  last one 11-27-2015  . LAPAROSCOPIC VAGINAL HYSTERECTOMY WITH SALPINGECTOMY Bilateral 02/01/2016   Procedure: LAPAROSCOPIC ASSISTED VAGINAL HYSTERECTOMY WITH SALPINGECTOMY;  Surgeon: Arvella Nigh, MD;  Location: [facility];  Service: Gynecology;  Laterality: Bilateral;  . TONSILLECTOMY      Family History  Problem Relation Age of Onset  . Stroke Father   . Hypertension Father   . Gout Father   . Hyperlipidemia Father   . Colon cancer Father   . Asthma Mother   . Alcohol abuse Unknown        grandfather  . Breast cancer Unknown        grandmother  . Hypertension Unknown        grandmother  . Asthma Unknown        grandmother  . Breast cancer Maternal Grandmother   . Esophageal cancer Neg Hx   . Stomach cancer  Neg Hx   . Rectal cancer Neg Hx     Social History   Tobacco Use  . Smoking status: Never Smoker  . Smokeless tobacco: Never Used  Substance Use Topics  . Alcohol use: Yes    Alcohol/week: 1.2 oz    Types: 2 Cans of beer per week    Subjective:  History of cold sores; has been having increased break-outs in the past 2 months; feels she has had 3-4 outbreaks since December; thinks she has taken some type of medication in the past but does not remember the name of the medicine; remembers that first outbreak occurred as a child; is  concerned that starting to having some scars secondary to the sores; has tried OTC Abreva with no benefit;   Admits that stress level has been very high recently; not sleeping well due to working 3rd shift; admits that she is feeling more depressed about her "life" and is not wanting to be part of the things that normally make her happy; denies being suicidal; would like to re-start the "last medication I took for depression."  Objective:  Vitals:   04/26/17 1332  BP: 110/80  Pulse: 99  Temp: 97.8 F (36.6 C)  TempSrc: Oral  SpO2: 99%  Weight: 192 lb 0.6 oz (87.1 kg)  Height: 5\' 4"  (1.626 m)    General: Well developed, well nourished, in no acute distress  Skin : Warm and dry. C/w cold sore at base of left nostril Head: Normocephalic and atraumatic  Eyes: Sclera and conjunctiva clear; pupils round and reactive to light; extraocular movements intact  Lungs: Respirations unlabored; clear to auscultation bilaterally without wheeze, rales, rhonchi  [facility] exam: normal rate and regular rhythm.  Neurologic: Alert and oriented; speech intact; face symmetrical; moves all extremities well; CNII-XII intact without focal deficit  Assessment:  1. HSV-1 infection   2. Anxiety   3. Mild episode of recurrent major depressive disorder ([place])     Plan:  1. Start patient on Valtrex 500 mg bid x 5 days for current outbreak and then change to qd for prevention; also discussed trial of OTC Lysine to help prevention; encouraged good nutrition, rest to help support her immune system as well; reviewed labs from recent CPE; 2. & 3. Lexapro appears to be last medication that patient took; she notes she tolerated well but had to stop due to financial concerns; re-start Lexapro 10 mg daily; follow-up in 1 month to evaluate response.   Return in about 1 month (around 05/27/2017).  No orders of the defined types were placed in this encounter.   Requested Prescriptions   Signed Prescriptions Disp Refills  .  valACYclovir (VALTREX) 500 MG tablet 60 tablet 2    Sig: Take 1 tablet (500 mg total) by mouth 2 (two) times daily.  Marland Kitchen escitalopram (LEXAPRO) 10 MG tablet 30 tablet 1    Sig: Take 1 tablet (10 mg total) by mouth daily.

## 2017-05-19 ENCOUNTER — Ambulatory Visit: Payer: BLUE CROSS/BLUE SHIELD | Admitting: Family

## 2017-06-12 ENCOUNTER — Other Ambulatory Visit: Payer: Self-pay | Admitting: Internal Medicine

## 2017-06-12 NOTE — Telephone Encounter (Signed)
Copied from [place] [id number]. Topic: Quick Communication - Rx Refill/Question >> Jun 12, 2017  3:07 PM Synthia Innocent wrote: Medication:  amphetamine-dextroamphetamine (ADDERALL) 5 MG tablet and methylphenidate (METADATE CD) 40 MG CR capsule   Has the patient contacted their pharmacy? Yes.     (Agent: If no, request that the patient contact the pharmacy for the refill.)   Preferred Pharmacy (with phone number or street name): [facility]   Agent: Please be advised that RX refills may take up to 3 business days. We ask that you follow-up with your pharmacy.

## 2017-06-13 MED ORDER — AMPHETAMINE-DEXTROAMPHETAMINE 5 MG PO TABS: 5.0000 mg | ORAL_TABLET | Freq: Every day | ORAL | 0 refills | Status: DC | PRN

## 2017-06-13 MED ORDER — METHYLPHENIDATE HCL ER (CD) 40 MG PO CPCR: 40.0000 mg | ORAL_CAPSULE | ORAL | 0 refills | Status: DC

## 2017-06-13 NOTE — Telephone Encounter (Signed)
LOV 04/26/17 Dr. Maryann Conners

## 2017-06-13 NOTE — Telephone Encounter (Signed)
Done erx 

## 2017-08-25 ENCOUNTER — Other Ambulatory Visit: Payer: BLUE CROSS/BLUE SHIELD

## 2017-08-25 ENCOUNTER — Encounter: Payer: Self-pay | Admitting: Internal Medicine

## 2017-08-25 ENCOUNTER — Ambulatory Visit (INDEPENDENT_AMBULATORY_CARE_PROVIDER_SITE_OTHER): Payer: BLUE CROSS/BLUE SHIELD | Admitting: Internal Medicine

## 2017-08-25 VITALS — BP 130/90 | HR 73 | Ht 64.0 in | Wt 201.0 lb

## 2017-08-25 DIAGNOSIS — F419 Anxiety disorder, unspecified: Secondary | ICD-10-CM | POA: Diagnosis not present

## 2017-08-25 DIAGNOSIS — Z0000 Encounter for general adult medical examination without abnormal findings: Secondary | ICD-10-CM

## 2017-08-25 DIAGNOSIS — F909 Attention-deficit hyperactivity disorder, unspecified type: Secondary | ICD-10-CM

## 2017-08-25 DIAGNOSIS — R400 Somnolence: Secondary | ICD-10-CM | POA: Diagnosis not present

## 2017-08-25 DIAGNOSIS — R079 Chest pain, unspecified: Secondary | ICD-10-CM

## 2017-08-25 DIAGNOSIS — J309 Allergic rhinitis, unspecified: Secondary | ICD-10-CM | POA: Diagnosis not present

## 2017-08-25 MED ORDER — CITALOPRAM HYDROBROMIDE 20 MG PO TABS: 20.0000 mg | ORAL_TABLET | Freq: Every day | ORAL | 3 refills | Status: DC

## 2017-08-25 MED ORDER — VALACYCLOVIR HCL 500 MG PO TABS: 500.0000 mg | ORAL_TABLET | Freq: Two times a day (BID) | ORAL | 2 refills | Status: DC

## 2017-08-25 MED ORDER — METHYLPHENIDATE HCL ER (CD) 40 MG PO CPCR: 40.0000 mg | ORAL_CAPSULE | ORAL | 0 refills | Status: DC

## 2017-08-25 MED ORDER — CETIRIZINE HCL 10 MG PO TABS: 10.0000 mg | ORAL_TABLET | Freq: Every day | ORAL | 3 refills | Status: DC

## 2017-08-25 MED ORDER — TRIAMCINOLONE ACETONIDE 55 MCG/ACT NA AERO: 2.0000 | INHALATION_SPRAY | Freq: Every day | NASAL | 3 refills | Status: DC

## 2017-08-25 MED ORDER — AMPHETAMINE-DEXTROAMPHETAMINE 5 MG PO TABS: 5.0000 mg | ORAL_TABLET | Freq: Every day | ORAL | 0 refills | Status: DC | PRN

## 2017-08-25 NOTE — Patient Instructions (Addendum)
Please take all new medication as prescribed - the celexa 20 mg per day, as well as zyrtec and nasacort for allergies  You can also take Mucinex (or it's generic off brand) for congestion, and tylenol as needed for pain.  Please continue all other medications as before - the ADHD meds  Please have the pharmacy call with any other refills you may need.  Please continue your efforts at being more active, low cholesterol diet, and weight control.  Please keep your appointments with your specialists as you may have planned  Your EKG was OK today  You will be contacted regarding the referral for: Echocardiogram, and Pulmonary for possible sleep apnea  No further lab work needed today  You will be contacted by phone if any changes need to be made immediately.  Otherwise, you will receive a letter about your results with an explanation, but please check with MyChart first.  Please remember to sign up for MyChart if you have not done so, as this will be important to you in the future with finding out test results, communicating by private email, and scheduling acute appointments online when needed.  Please return in 6 months, or sooner if needed, with Lab testing done 3-5 days before

## 2017-08-25 NOTE — Progress Notes (Signed)
Subjective:    Patient ID: Kellie Curtis, female    DOB: 05/23/1970, 47 y.o.   MRN: [id number]  HPI  Here to f/u worsening anxiety, never started SSRI as pt states cannot afford lexapro.  Has multiple recent stressors, including job which has changed with different hours and responsibilities on weekends sometimes which causes increased stress for her since her husband (who is wanting full custody) has to take the children.  Is asking for counseling referral as well.  Also having more difficulty sleeping due to working 3rd shift.  ADHD meds not working as well, more difficult to concentrate and complete tasks.  Pt denies increased sob or doe, wheezing, orthopnea, PND, increased LE swelling, palpitations, dizziness or syncope, but has had intermittent upper mid chest sharp pains without radiation, diaphoresis, n/v.  Recalls she was told she had "fluid around my heart" on an echo and was reassured at the time, but now very concerned.   Pt denies fever, wt loss, night sweats, loss of appetite, or other constitutional symptoms.  Does have several wks ongoing nasal allergy symptoms with clearish congestion, itch and sneezing, without fever, pain, ST, cough, swelling or wheezing.  Also c/o worsening daytime somnolence assoc with wt gain. Wt Readings from Last 3 Encounters:  08/25/17 201 lb (91.2 kg)  04/26/17 192 lb 0.6 oz (87.1 kg)  03/16/17 192 lb (87.1 kg)   Past Medical History:  Diagnosis Date  . Abdominal pain, left lower quadrant   . ADHD (attention deficit hyperactivity disorder)   . Allergic rhinitis   . Anemia   . Anxiety state, unspecified   . Asthma    Controlled  . Depression   . Dyslexia   . Dysmenorrhea   . Elevated blood pressure reading without diagnosis of hypertension   . GERD (gastroesophageal reflux disease)   . History of adenomatous polyp of colon    tubular adenoma's 2012;  2017  . Hyperlipidemia   . IBS (irritable bowel syndrome)   . Menorrhagia   . Other convulsions    . Seizure disorder ([place])   . Uterus, adenomyosis    Past Surgical History:  Procedure Laterality Date  . CESAREAN SECTION  x2  last one 02-22-2005   Bilateral Tubal Ligation w/ last one  . COLONOSCOPY  last one 11-27-2015  . LAPAROSCOPIC VAGINAL HYSTERECTOMY WITH SALPINGECTOMY Bilateral 02/01/2016   Procedure: LAPAROSCOPIC ASSISTED VAGINAL HYSTERECTOMY WITH SALPINGECTOMY;  Surgeon: Arvella Nigh, MD;  Location: [facility];  Service: Gynecology;  Laterality: Bilateral;  . TONSILLECTOMY      reports that she has never smoked. She has never used smokeless tobacco. She reports that she drinks about 1.2 oz of alcohol per week. She reports that she does not use drugs. family history includes Alcohol abuse in her unknown relative; Asthma in her mother and unknown relative; Breast cancer in her maternal grandmother and unknown relative; Colon cancer in her father; Gout in her father; Hyperlipidemia in her father; Hypertension in her father and unknown relative; Stroke in her father. Allergies  Allergen Reactions  . Latex Rash  . Adhesive [Tape] Rash  . Hydrocodone-Homatropine Itching   No current outpatient medications on file prior to visit.   No current facility-administered medications on file prior to visit.    Review of Systems  Constitutional: Negative for other unusual diaphoresis or sweats HENT: Negative for ear discharge or swelling Eyes: Negative for other worsening visual disturbances Respiratory: Negative for stridor or other swelling  Gastrointestinal: Negative for worsening distension  or other blood Genitourinary: Negative for retention or other urinary change Musculoskeletal: Negative for other MSK pain or swelling Skin: Negative for color change or other new lesions Neurological: Negative for worsening tremors and other numbness  Psychiatric/Behavioral: Negative for worsening agitation or other fatigue All other system neg per pt    Objective:   Physical  Exam BP 130/90 (BP Location: Left Arm, Patient Position: Sitting, Cuff Size: Normal)   Pulse 73   Ht 5\' 4"  (1.626 m)   Wt 201 lb (91.2 kg)   LMP 01/20/2016   SpO2 98%   BMI 34.50 kg/m  VS noted,  Constitutional: Pt appears in NAD HENT: Head: NCAT.  Right Ear: External ear normal.  Left Ear: External ear normal.  Eyes: . Pupils are equal, round, and reactive to light. Conjunctivae and EOM are normal Bilat tm's with mild erythema.  Max sinus areas mild tender.  Pharynx with mild erythema, no exudate Nose: without d/c or deformity Neck: Neck supple. Gross normal ROM Cardiovascular: Normal rate and regular rhythm.   Pulmonary/Chest: Effort normal and breath sounds without rales or wheezing.  Abd:  Soft, NT, ND, + BS, no organomegaly Neurological: Pt is alert. At baseline orientation, motor grossly intact Skin: Skin is warm. No rashes, other new lesions, no LE edema Psychiatric: Pt behavior is normal without agitation , 2+ nervous No other exam findings  ECG today I have personally interpreted: NSR 73 -    Assessment & Plan:

## 2017-08-26 DIAGNOSIS — R400 Somnolence: Secondary | ICD-10-CM | POA: Insufficient documentation

## 2017-08-26 NOTE — Assessment & Plan Note (Addendum)
Mod to severe worsening, for counseling referral, celexa 20 qd (on $[facility] list),  to f/u any worsening symptoms or concerns  Note:  Total time for pt hx, exam, review of record with pt in the room, determination of diagnoses and plan for further eval and tx is > 40 min, with over 50% spent in coordination and counseling of patient including the differential dx, tx, further evaluation and other management of anxiety, CP, daytime somnolence, ADHD and allergic rhinitis

## 2017-08-26 NOTE — Assessment & Plan Note (Signed)
Ok for pulm referral, r/o osa 

## 2017-08-26 NOTE — Assessment & Plan Note (Signed)
Mild, very low suspicion for acs, ecg reviewed, for echo per pt reqeust, f  to f/u any worsening symptoms or concerns

## 2017-08-26 NOTE — Assessment & Plan Note (Signed)
Mild to mod, for zyrtec, nasacort and mucinex prn, to f/u any worsening symptoms or concerns

## 2017-08-26 NOTE — Assessment & Plan Note (Signed)
With mild worsening related to above, to cont same med tx

## 2017-08-29 ENCOUNTER — Other Ambulatory Visit (HOSPITAL_COMMUNITY): Payer: BLUE CROSS/BLUE SHIELD

## 2017-09-01 ENCOUNTER — Other Ambulatory Visit (HOSPITAL_COMMUNITY): Payer: BLUE CROSS/BLUE SHIELD

## 2017-09-11 ENCOUNTER — Ambulatory Visit (HOSPITAL_COMMUNITY): Payer: BLUE CROSS/BLUE SHIELD | Attending: Internal Medicine

## 2017-09-11 DIAGNOSIS — R0989 Other specified symptoms and signs involving the circulatory and respiratory systems: Secondary | ICD-10-CM

## 2017-09-18 ENCOUNTER — Encounter: Payer: Self-pay | Admitting: Internal Medicine

## 2017-09-22 ENCOUNTER — Ambulatory Visit: Payer: BLUE CROSS/BLUE SHIELD | Admitting: Internal Medicine

## 2017-11-03 ENCOUNTER — Encounter: Payer: Self-pay | Admitting: Pulmonary Disease

## 2017-11-03 ENCOUNTER — Ambulatory Visit (INDEPENDENT_AMBULATORY_CARE_PROVIDER_SITE_OTHER): Payer: BLUE CROSS/BLUE SHIELD | Admitting: Pulmonary Disease

## 2017-11-03 VITALS — BP 118/72 | HR 82 | Ht 64.0 in | Wt 199.0 lb

## 2017-11-03 DIAGNOSIS — G4726 Circadian rhythm sleep disorder, shift work type: Secondary | ICD-10-CM

## 2017-11-03 DIAGNOSIS — Z72821 Inadequate sleep hygiene: Secondary | ICD-10-CM | POA: Diagnosis not present

## 2017-11-03 DIAGNOSIS — R0683 Snoring: Secondary | ICD-10-CM

## 2017-11-03 DIAGNOSIS — G4719 Other hypersomnia: Secondary | ICD-10-CM | POA: Diagnosis not present

## 2017-11-03 NOTE — Progress Notes (Signed)
[facility]  Chief Complaint  Patient presents with  . Sleep Consult    Constitutional: BP 118/72 (BP Location: Left Arm, Cuff Size: Normal)   Pulse 82   Ht 5\' 4"  (1.626 m)   Wt 199 lb (90.3 kg)   LMP 01/20/2016   SpO2 96%   BMI 34.16 kg/m   History of Present Illness: Kellie Curtis is a 47 y.o. female daytime sleepiness.  She has history of ADHD and anxiety.  She was working 3rd shift until few weeks ago.  She is now working 1st shift.  She snores, and has disturbed sleep.  Several of her family members have sleep problems, including her twin sister.  She is always tired and sleepy.  She wakes up feeling like she can't breath at times.  She does not have a set time when she goes to bed.  She was using melatonin to help sleep, but this no longer works.  She wakes up several times to use the bathroom.  She will talk in her sleep.  She gets up at different times in the morning depending her work schedule. She has been using methylphenidate and adderall for years.   She denies sleep walking, bruxism, or nightmares.  There is no history of restless legs.  She denies sleep hallucinations, sleep paralysis, or cataplexy.  The Epworth score is 5 out of 24.  She has noticed getting neck pain and stiffness and then this can trigger a headache.   Comprehensive Respiratory Exam:  Appearance - well kempt  ENMT - nasal mucosa moist, turbinates clear, midline nasal septum, no dental lesions, no gingival bleeding, no oral exudates, scalloped tongue, low laying soft palate, elongated uvula Neck - no masses, trachea midline, no thyromegaly, no elevation in JVP Respiratory - normal appearance of chest wall, normal respiratory effort w/o accessory muscle use, no dullness on percussion, no wheezing or rales CV - s1s2 regular rate and rhythm, no murmurs, no peripheral edema, radial pulses symmetric GI - soft, non tender, no masses, no hepatosplenomegaly Lymph  - no adenopathy noted in neck and axillary areas MSK - normal muscle strength and tone, normal gait Ext - no cyanosis, clubbing, or joint inflammation noted Skin - no rashes, lesions, or ulcers Neuro - oriented to person, place, and time Psych - normal mood and affect  Discussion: She has snoring, apnea, disturbed sleep and daytime sleepiness.  She has poor sleep hygiene, and was working 3rd shift.  She has history of mood disorders.  Assessment/Plan:  Snoring with excessive daytime sleepiness. - will arrange for home sleep study  Obesity. - discussed how weight can impact sleep and risk for sleep disordered breathing - discussed options to assist with weight loss: combination of diet modification, cardiovascular and strength training exercises  Cardiovascular risk. - had an extensive discussion regarding the adverse health consequences related to untreated sleep disordered breathing - specifically discussed the risks for hypertension, coronary artery disease, cardiac dysrhythmias, cerebrovascular disease, and diabetes - lifestyle modification discussed  Safe driving practices. - discussed how sleep disruption can increase risk of accidents, particularly when driving - safe driving practices were discussed  Therapies for obstructive sleep apnea. - if the sleep study shows significant sleep apnea, then various therapies for treatment were reviewed: CPAP, oral appliance, and surgical interventions  Poor sleep hygiene with shift work schedule. - discussed importance of regular sleep/wake schedule - proper sleep hygiene reviewed   Patient Instructions  Will arrange for home sleep study  Will call to arrange for follow up after sleep study reviewed    Chesley Mires, MD [place] 11/03/2017, 3:09 PM  Flow Sheet  Sleep tests:  Review of Systems: Constitutional: Negative for fever and unexpected weight change.  HENT: Positive for ear pain, postnasal drip  and trouble swallowing. Negative for congestion, dental problem, nosebleeds, rhinorrhea, sinus pressure, sneezing and sore throat.   Eyes: Negative for redness and itching.  Respiratory: Positive for cough, chest tightness and shortness of breath. Negative for wheezing.   Cardiovascular: Positive for palpitations. Negative for leg swelling.  Gastrointestinal: Negative for nausea and vomiting.  Genitourinary: Negative for dysuria.  Musculoskeletal: Negative for joint swelling.  Skin: Negative for rash.  Allergic/Immunologic: Positive for environmental allergies. Negative for food allergies and immunocompromised state.  Neurological: Negative for headaches.  Hematological: Does not bruise/bleed easily.  Psychiatric/Behavioral: Negative for dysphoric mood. The patient is nervous/anxious.    Past Medical History: She  has a past medical history of Abdominal pain, left lower quadrant, ADHD (attention deficit hyperactivity disorder), Allergic rhinitis, Anemia, Anxiety state, unspecified, Asthma, Depression, Dyslexia, Dysmenorrhea, Elevated blood pressure reading without diagnosis of hypertension, GERD (gastroesophageal reflux disease), History of adenomatous polyp of colon, Hyperlipidemia, IBS (irritable bowel syndrome), Menorrhagia, Other convulsions, Seizure disorder ([place]), and Uterus, adenomyosis.  Past Surgical History: She  has a past surgical history that includes Cesarean section (x2  last one 02-22-2005); Tonsillectomy; Colonoscopy (last one 11-27-2015); and Laparoscopic vaginal hysterectomy with salpingectomy (Bilateral, 02/01/2016).  Family History: Her family history includes Alcohol abuse in her unknown relative; Asthma in her mother and unknown relative; Breast cancer in her maternal grandmother and unknown relative; Colon cancer in her father; Gout in her father; Hyperlipidemia in her father; Hypertension in her father and unknown relative; Stroke in her father.  Social History: She   reports that she has never smoked. She has never used smokeless tobacco. She reports that she drinks about 1.2 oz of alcohol per week. She reports that she does not use drugs.  Medications: Allergies as of 11/03/2017      Reactions   Latex Rash   Adhesive [tape] Rash   Hydrocodone-homatropine Itching      Medication List        Accurate as of 11/03/17  3:09 PM. Always use your most recent med list.          amphetamine-dextroamphetamine 5 MG tablet Commonly known as:  ADDERALL Take 1 tablet (5 mg total) by mouth daily as needed.   cetirizine 10 MG tablet Commonly known as:  ZYRTEC Take 1 tablet (10 mg total) by mouth daily.   citalopram 20 MG tablet Commonly known as:  CELEXA Take 1 tablet (20 mg total) by mouth daily.   methylphenidate 40 MG CR capsule Commonly known as:  METADATE CD Take 1 capsule (40 mg total) by mouth every morning.   triamcinolone 55 MCG/ACT Aero nasal inhaler Commonly known as:  NASACORT Place 2 sprays into the nose daily.   valACYclovir 500 MG tablet Commonly known as:  VALTREX Take 1 tablet (500 mg total) by mouth 2 (two) times daily.

## 2017-11-03 NOTE — Progress Notes (Signed)
   Subjective:    Patient ID: Kellie Curtis, female    DOB: [birth year], 47 y.o.   MRN: [id number]  HPI    Review of Systems  Constitutional: Negative for fever and unexpected weight change.  HENT: Positive for ear pain, postnasal drip and trouble swallowing. Negative for congestion, dental problem, nosebleeds, rhinorrhea, sinus pressure, sneezing and sore throat.   Eyes: Negative for redness and itching.  Respiratory: Positive for cough, chest tightness and shortness of breath. Negative for wheezing.   Cardiovascular: Positive for palpitations. Negative for leg swelling.  Gastrointestinal: Negative for nausea and vomiting.  Genitourinary: Negative for dysuria.  Musculoskeletal: Negative for joint swelling.  Skin: Negative for rash.  Allergic/Immunologic: Positive for environmental allergies. Negative for food allergies and immunocompromised state.  Neurological: Negative for headaches.  Hematological: Does not bruise/bleed easily.  Psychiatric/Behavioral: Negative for dysphoric mood. The patient is nervous/anxious.        Objective:   Physical Exam        Assessment & Plan:

## 2017-11-03 NOTE — Patient Instructions (Signed)
Will arrange for home sleep study Will call to arrange for follow up after sleep study reviewed  

## 2017-11-17 ENCOUNTER — Telehealth: Payer: Self-pay | Admitting: Internal Medicine

## 2017-11-17 MED ORDER — AMPHETAMINE-DEXTROAMPHETAMINE 5 MG PO TABS: 5.0000 mg | ORAL_TABLET | Freq: Every day | ORAL | 0 refills | Status: DC | PRN

## 2017-11-17 NOTE — Addendum Note (Signed)
Addended by: Biagio Borg on: 11/17/2017 04:20 PM   Modules accepted: Orders

## 2017-11-17 NOTE — Telephone Encounter (Signed)
Copied from [place] [id number]. Topic: Quick Communication - See Telephone Encounter >> Nov 17, 2017  2:17 PM Hewitt Shorts wrote: Pt is needing a refill on adderall and metadate CD   [facility] [address] [phone]   [facility] [phone]

## 2017-11-17 NOTE — Telephone Encounter (Signed)
Done erx 

## 2017-11-17 NOTE — Telephone Encounter (Signed)
adderall refill Last Refill:08/25/17 # 30 Last OV:08/25/17 PCP: Dr Cathlean Cower  Pharmacy: [facility] [place], [place]  mentadate CD refill Last Refill:08/25/17 # 30 Last OV:08/25/17 PCP: Dr Cathlean Cower  Pharmacy: [facility] [place], [place]

## 2018-01-03 ENCOUNTER — Telehealth: Payer: Self-pay | Admitting: Internal Medicine

## 2018-01-03 ENCOUNTER — Encounter: Payer: Self-pay | Admitting: Internal Medicine

## 2018-01-03 NOTE — Telephone Encounter (Signed)
Pt informed.   Letter at front desk.

## 2018-01-03 NOTE — Telephone Encounter (Signed)
Done hardcopy to shirron 

## 2018-01-03 NOTE — Telephone Encounter (Addendum)
Copied from [place] [phone]. Topic: General - Other >> Jan 03, 2018 10:20 AM Lennox Solders wrote: Reason for CRM: pt needs a letter stating that ADHD medications and will not effect her driving ability nor performance. Pt would like to pick up letter. 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

## 2018-03-22 ENCOUNTER — Telehealth: Payer: Self-pay

## 2018-03-22 MED ORDER — AMPHETAMINE-DEXTROAMPHETAMINE 5 MG PO TABS: 5.0000 mg | ORAL_TABLET | Freq: Every day | ORAL | 0 refills | Status: DC | PRN

## 2018-03-22 NOTE — Addendum Note (Signed)
Addended by: Biagio Borg on: 03/22/2018 12:50 PM   Modules accepted: Orders

## 2018-03-22 NOTE — Telephone Encounter (Signed)
Copied from [place] [id number]. Topic: General - Other >> Mar 22, 2018  9:22 AM Carolyn Stare wrote: Pt call to ask if she can have refills on the below meds said she has not had these meds in a while    amphetamine-dextroamphetamine (ADDERALL) 5 MG tablet   methylphenidate (METADATE CD) 40 MG CR capsule    [facility] [address]  [address]

## 2018-03-29 ENCOUNTER — Other Ambulatory Visit: Payer: Self-pay | Admitting: Internal Medicine

## 2018-03-29 MED ORDER — METHYLPHENIDATE HCL ER (CD) 40 MG PO CPCR: 40.0000 mg | ORAL_CAPSULE | ORAL | 0 refills | Status: DC

## 2018-03-29 NOTE — Telephone Encounter (Signed)
   LOV:08/25/17 NextOV: not scheduled Last Filled/Quantity:11/21/17 30#

## 2018-03-29 NOTE — Telephone Encounter (Signed)
Copied from [place] [id number]. Topic: Quick Communication - Rx Refill/Question >> Mar 29, 2018 11:40 AM Sheran Luz wrote: Medication: methylphenidate (METADATE CD) 40 MG CR capsule   Patient is requesting a refill of this medication. Please advise.   Preferred Pharmacy (with phone number or street name):[place], [place]. [phone] (Phone) [phone] (Fax)

## 2018-03-29 NOTE — Telephone Encounter (Signed)
Requested medication (s) are due for refill today: yes  Requested medication (s) are on the active medication list: yes    Last refill: 08/25/17  #30  0 refills  Future visit scheduled no  Notes to clinic:not delegated  Requested Prescriptions  Pending Prescriptions Disp Refills   methylphenidate (METADATE CD) 40 MG CR capsule 30 capsule 0    Sig: Take 1 capsule (40 mg total) by mouth every morning.     Not Delegated - Psychiatry:  Stimulants/ADHD Failed - 03/29/2018 12:14 PM      Failed - This refill cannot be delegated      Failed - Urine Drug Screen completed in last 360 days.      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

## 2018-04-16 ENCOUNTER — Telehealth: Payer: Self-pay

## 2018-04-16 NOTE — Telephone Encounter (Signed)
-----   Message from Chesley Mires, MD sent at 04/13/2018  5:11 PM EST ----- Regarding: RE: HST Kellie Curtis,  Please call to ask her if she would like to defer sleep testing and she can call back when she is ready to get a test done.  V  ----- Message ----- From: [place] Sent: 04/13/2018   1:35 PM EST To: Chesley Mires, MD, Georjean Mode, CMA Subject: HST                                            Dr. Halford Chessman you placed and order for this patient to have a home sleep test back on 11/21/2017. When I called her on 11/15/2017 she stated that she needed to check her work scheduled and for me to call back in 2 weeks. I left voice message on 11/28/2017 and 12/06/2017. I finally got to speak with her and got her scheduled to pick up the home sleep test machine on 01/05/2018. When I called on 10/319 to remind her of the appt. She stated that she was working 3 shift. She no showed. She wanted me to call her back mid November 2019. I did and when I called her back she stated that her work schedule was really crazy and ask that I call her back at the end of December 2019. I waited and called the patient 04/06/18 and made the appt for today 04/13/18 for her to pick up the machine and she ask that I call her on 04/13/18 to remind her of the appt. She stated to call from 10-1 or after 5:00pm. I called her on 04/13/18 @ 5:15pm left message reminder her of the appt to pick up HST machine. She called me this morning stating that she couldn't pick up the machine this afternoon at 4:30pm could she pick it up between 10 and 12:30. She was running errands and could pick up the machine as long as it was before 1:00pm. I scrambled around getting the machine ready for her to pick up. At 12:10 she had not shown up to pick up the machine and I called her and left message that I was waiting on her to show up also delaying my lunch. She called me right back and stated she was just not able to do the home sleep test due to her work  schedule. I told her I would let you know this.  Thanks, Rodena Piety

## 2018-04-16 NOTE — Telephone Encounter (Signed)
Attempted to call patient today regarding if she wants to complete sleep study or not. I did not receive an answer at time of call. I have left a voicemail message for pt to return call. X1

## 2018-04-19 NOTE — Telephone Encounter (Signed)
Attempted to call patient today regarding results. I did not receive an answer at time of call. I have left a voicemail message for pt to return call. X2  

## 2018-04-23 NOTE — Telephone Encounter (Signed)
Attempted to call patient today regarding results. I have left a voicemail message for pt to return call. X3 We have left three messages for pt to call our office back regarding results. No response at this time, a letter has been placed in mail today for pt to call our office. Mailed letter of communication today. Nothing further needed at this time.    

## 2018-05-07 ENCOUNTER — Other Ambulatory Visit: Payer: Self-pay | Admitting: Internal Medicine

## 2018-05-07 MED ORDER — AMPHETAMINE-DEXTROAMPHETAMINE 5 MG PO TABS: 5.0000 mg | ORAL_TABLET | Freq: Every day | ORAL | 0 refills | Status: DC | PRN

## 2018-05-07 MED ORDER — METHYLPHENIDATE HCL ER (CD) 40 MG PO CPCR: 40.0000 mg | ORAL_CAPSULE | ORAL | 0 refills | Status: DC

## 2018-05-07 NOTE — Telephone Encounter (Signed)
Done erx 

## 2018-05-07 NOTE — Telephone Encounter (Signed)
Copied from [place] [id number]. Topic: Quick Communication - Rx Refill/Question >> May 07, 2018  9:28 AM Selinda Flavin B, NT wrote: Medication: methylphenidate (METADATE CD) 40 MG CR capsule  amphetamine-dextroamphetamine (ADDERALL) 5 MG tablet   Has the patient contacted their pharmacy? Yes.   (Agent: If no, request that the patient contact the pharmacy for the refill.) (Agent: If yes, when and what did the pharmacy advise?)  Preferred Pharmacy (with phone number or street name): [place], [place].  Agent: Please be advised that RX refills may take up to 3 business days. We ask that you follow-up with your pharmacy.

## 2018-06-28 ENCOUNTER — Other Ambulatory Visit: Payer: Self-pay | Admitting: Internal Medicine

## 2018-06-28 MED ORDER — AMPHETAMINE-DEXTROAMPHETAMINE 5 MG PO TABS: 5.0000 mg | ORAL_TABLET | Freq: Every day | ORAL | 0 refills | Status: DC | PRN

## 2018-06-28 MED ORDER — METHYLPHENIDATE HCL ER (CD) 40 MG PO CPCR: 40.0000 mg | ORAL_CAPSULE | ORAL | 0 refills | Status: DC

## 2018-06-28 NOTE — Telephone Encounter (Signed)
Done erx 

## 2018-06-28 NOTE — Telephone Encounter (Signed)
Requested medication (s) are due for refill today:yes      Requested medication (s) are on the active medication list: {Yes  Last refill: 05/07/2018  #30 0 refills  Future visit scheduled {no  Notes to clinic:not delegated  Requested Prescriptions  Pending Prescriptions Disp Refills   amphetamine-dextroamphetamine (ADDERALL) 5 MG tablet 30 tablet 0    Sig: Take 1 tablet (5 mg total) by mouth daily as needed.     Not Delegated - Psychiatry:  Stimulants/ADHD Failed - 06/28/2018 11:41 AM      Failed - This refill cannot be delegated      Failed - Urine Drug Screen completed in last 360 days.      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     Not Delegated - Psychiatry:  Stimulants/ADHD Failed - 06/28/2018 11:41 AM      Failed - This refill cannot be delegated      Failed - Urine Drug Screen completed in last 360 days.      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

## 2018-09-06 ENCOUNTER — Ambulatory Visit: Payer: BC Managed Care – PPO | Admitting: Internal Medicine

## 2018-09-06 ENCOUNTER — Telehealth: Payer: Self-pay | Admitting: Internal Medicine

## 2018-09-06 DIAGNOSIS — F909 Attention-deficit hyperactivity disorder, unspecified type: Secondary | ICD-10-CM

## 2018-09-06 MED ORDER — AMPHETAMINE-DEXTROAMPHETAMINE 5 MG PO TABS: 5.0000 mg | ORAL_TABLET | Freq: Every day | ORAL | 0 refills | Status: DC | PRN

## 2018-09-06 MED ORDER — METHYLPHENIDATE HCL ER (CD) 40 MG PO CPCR: 40.0000 mg | ORAL_CAPSULE | ORAL | 0 refills | Status: DC

## 2018-09-06 NOTE — Progress Notes (Signed)
Patient ID: Kellie Curtis, female   DOB: 1971/03/17, 48 y.o.   MRN: [id number]  error

## 2018-09-06 NOTE — Patient Instructions (Signed)
error 

## 2018-09-06 NOTE — Telephone Encounter (Signed)
Done erx 

## 2018-09-06 NOTE — Telephone Encounter (Signed)
Copied from [place] [id number]. Topic: Quick Communication - Rx Refill/Question >> Sep 06, 2018  8:42 AM Sheran Luz wrote: Medication: amphetamine-dextroamphetamine (ADDERALL) 5 MG tablet  methylphenidate (METADATE CD) 40 MG CR capsule   Patient is requesting refill.    Preferred Pharmacy (with phone number or street name):[place], [place]. [phone] (Phone) [phone] (Fax)

## 2018-09-06 NOTE — Addendum Note (Signed)
Addended by: Biagio Borg on: 09/06/2018 10:32 AM   Modules accepted: Orders

## 2018-11-16 ENCOUNTER — Other Ambulatory Visit: Payer: Self-pay

## 2018-11-16 ENCOUNTER — Emergency Department (HOSPITAL_COMMUNITY)
Admission: EM | Admit: 2018-11-16 | Discharge: 2018-11-17 | Disposition: A | Discharge status: Home / Self Care | Payer: BC Managed Care – PPO | Attending: Emergency Medicine | Admitting: Emergency Medicine

## 2018-11-16 ENCOUNTER — Encounter (HOSPITAL_COMMUNITY): Payer: Self-pay | Admitting: Emergency Medicine

## 2018-11-16 DIAGNOSIS — F909 Attention-deficit hyperactivity disorder, unspecified type: Secondary | ICD-10-CM | POA: Insufficient documentation

## 2018-11-16 DIAGNOSIS — R0789 Other chest pain: Secondary | ICD-10-CM | POA: Insufficient documentation

## 2018-11-16 DIAGNOSIS — Z8709 Personal history of other diseases of the respiratory system: Secondary | ICD-10-CM | POA: Insufficient documentation

## 2018-11-16 DIAGNOSIS — I10 Essential (primary) hypertension: Secondary | ICD-10-CM | POA: Diagnosis not present

## 2018-11-16 DIAGNOSIS — Z8669 Personal history of other diseases of the nervous system and sense organs: Secondary | ICD-10-CM | POA: Diagnosis not present

## 2018-11-16 DIAGNOSIS — Z91040 Latex allergy status: Secondary | ICD-10-CM | POA: Diagnosis not present

## 2018-11-16 DIAGNOSIS — Z79899 Other long term (current) drug therapy: Secondary | ICD-10-CM | POA: Insufficient documentation

## 2018-11-16 LAB — BASIC METABOLIC PANEL
Anion gap: 12 (ref 5–15)
BUN: 12 mg/dL (ref 6–20)
CO2: 26 mmol/L (ref 22–32)
Calcium: 9.7 mg/dL (ref 8.9–10.3)
Chloride: 102 mmol/L (ref 98–111)
Creatinine, Ser: 0.92 mg/dL (ref 0.44–1.00)
GFR calc Af Amer: >60 mL/min (ref >60)
GFR calc non Af Amer: >60 mL/min (ref >60)
Glucose, Bld: 116 mg/dL — ABNORMAL HIGH (ref 70–99)
Potassium: 4.3 mmol/L (ref 3.5–5.1)
Sodium: 140 mmol/L (ref 135–145)

## 2018-11-16 LAB — CBC
HCT: 43.4 % (ref 36.0–46.0)
Hemoglobin: 14.1 g/dL (ref 12.0–15.0)
MCH: 30.7 pg (ref 26.0–34.0)
MCHC: 32.5 g/dL (ref 30.0–36.0)
MCV: 94.6 fL (ref 80.0–100.0)
Platelets: 349 10*3/uL (ref 150–400)
RBC: 4.59 MIL/uL (ref 3.87–5.11)
RDW: 12.6 % (ref 11.5–15.5)
WBC: 6.1 10*3/uL (ref 4.0–10.5)
nRBC: 0 % (ref 0.0–0.2)

## 2018-11-16 LAB — TROPONIN I (HIGH SENSITIVITY): Troponin I (High Sensitivity): 6 ng/L (ref <18)

## 2018-11-16 MED ORDER — SODIUM CHLORIDE 0.9% FLUSH: 3.0000 mL | Freq: Once | INTRAVENOUS | Status: DC

## 2018-11-16 NOTE — ED Triage Notes (Signed)
Per [facility], Pt reports sharp CP to center of chest radiating to back. Pt reports SHOB due to pain. She denies lightheadedness or dizziness. She reports she just got back from a long car ride from [place].

## 2018-11-16 NOTE — ED Notes (Addendum)
Pt complaining that chest pain has came back asked for help. Pt stated she forgot to mention that she had acid reflux and last time she felt like this that is what the problem was.

## 2018-11-17 ENCOUNTER — Emergency Department (HOSPITAL_COMMUNITY): Payer: BC Managed Care – PPO

## 2018-11-17 LAB — TROPONIN I (HIGH SENSITIVITY): Troponin I (High Sensitivity): 4 ng/L (ref <18)

## 2018-11-17 MED ORDER — LIDOCAINE VISCOUS HCL 2 % MT SOLN
15.0000 mL | Freq: Once | OROMUCOSAL | Status: AC
  Administered 2018-11-17: 10:00:00 15 mL via OROMUCOSAL
  Filled 2018-11-17: qty 15

## 2018-11-17 MED ORDER — ALUM & MAG HYDROXIDE-SIMETH 200-200-20 MG/5ML PO SUSP
15.0000 mL | Freq: Once | ORAL | Status: AC
  Administered 2018-11-17: 10:00:00 15 mL via ORAL
  Filled 2018-11-17: qty 30

## 2018-11-17 NOTE — Discharge Instructions (Signed)
Please read instructions below. Follow up with your primary care provider regarding your visit today. Treat your symptoms of heartburn with over-the-counter medications such as pepcid or prilosec.  Return to the ER for new or worsening symptoms; including worsening chest pain, shortness of breath, pain that radiates to the arm or neck, pain or shortness of breath worsened with exertion.

## 2018-11-17 NOTE — ED Provider Notes (Signed)
[facility] EMERGENCY DEPARTMENT Provider Note   CSN: [id number] Arrival date & time: 11/16/18  2206    History   Chief Complaint Chief Complaint  Patient presents with  . Chest Pain    HPI Kellie Curtis is a 48 y.o. female with past medical history of GERD, hypertension, anxiety, asthma, diaphragmatic hernia, presenting to the emergency department with complaint of chest pain that began last night.  She states she got out of the car was going to the trunk to pull her bags out when she began having sharp chest pain that radiated to her back.  She states it is improving and is nearly subsided though still somewhat present.  It is worse with movement and very deep breathing.  She states it feels similar to her heartburn.  She also is having increased anxiety and this made her chest pain worse.  She denies cardiac history.  Denies history of PE/DVT, leg swelling or pain, exogenous estrogen use, tobacco use.  No nausea, vomiting, diaphoresis, cough, fever, shortness of breath.     The history is provided by the patient.    Past Medical History:  Diagnosis Date  . Abdominal pain, left lower quadrant   . ADHD (attention deficit hyperactivity disorder)   . Allergic rhinitis   . Anemia   . Anxiety state, unspecified   . Asthma    Controlled  . Depression   . Dyslexia   . Dysmenorrhea   . Elevated blood pressure reading without diagnosis of hypertension   . GERD (gastroesophageal reflux disease)   . History of adenomatous polyp of colon    tubular adenoma's 2012;  2017  . Hyperlipidemia   . IBS (irritable bowel syndrome)   . Menorrhagia   . Other convulsions   . Seizure disorder ([place])   . Uterus, adenomyosis     Patient Active Problem List   Diagnosis Date Noted  . Daytime somnolence 08/26/2017  . Acute upper respiratory infection 03/16/2017  . Urinary incontinence 03/16/2017  . Influenza 06/24/2016  . Cough 04/13/2016  . Lymphadenitis 03/01/2016  . S/P  laparoscopic assisted vaginal hysterectomy (LAVH) 02/01/2016  . Perimenopause 06/06/2014  . Thickened endometrium 06/06/2014  . Low grade squamous intraepithelial lesion (LGSIL) on Papanicolaou smear of cervix 05/25/2014  . Hemoptysis 05/10/2013  . Chest pain 01/11/2013  . Anemia, unspecified 10/04/2012  . Allergic rhinitis 03/10/2012  . Irregular menses 05/30/2011  . Irritable bowel syndrome with constipation 05/30/2011  . Encounter for well adult exam with abnormal findings 05/28/2011  . GERD 05/25/2010  . Diaphragmatic hernia 05/21/2010  . COLONIC POLYPS, HX OF 05/21/2010  . Constipation 04/27/2010  . Wheezing 12/16/2008  . Anxiety 03/14/2008  . Depression 03/14/2008  . Attention deficit hyperactivity disorder (ADHD) 03/14/2008  . SEIZURE DISORDER 02/13/2008  . HYPERLIPIDEMIA 02/07/2008  . BACK PAIN 02/07/2008    Past Surgical History:  Procedure Laterality Date  . CESAREAN SECTION  x2  last one 02-22-2005   Bilateral Tubal Ligation w/ last one  . COLONOSCOPY  last one 11-27-2015  . LAPAROSCOPIC VAGINAL HYSTERECTOMY WITH SALPINGECTOMY Bilateral 02/01/2016   Procedure: LAPAROSCOPIC ASSISTED VAGINAL HYSTERECTOMY WITH SALPINGECTOMY;  Surgeon: Arvella Nigh, MD;  Location: [place];  Service: Gynecology;  Laterality: Bilateral;  . TONSILLECTOMY       OB History    Gravida  3   Para  3   Term      Preterm      AB      Living  3     SAB      TAB      Ectopic      Multiple      Live Births               Home Medications    Prior to Admission medications   Medication Sig Start Date End Date Taking? Authorizing Provider  amphetamine-dextroamphetamine (ADDERALL) 5 MG tablet Take 1 tablet (5 mg total) by mouth daily as needed. 09/06/18   Biagio Borg, MD  cetirizine (ZYRTEC) 10 MG tablet Take 1 tablet (10 mg total) by mouth daily. Patient not taking: Reported on 11/03/2017 08/25/17 08/25/18  Biagio Borg, MD  citalopram (CELEXA) 20 MG tablet  Take 1 tablet (20 mg total) by mouth daily. Patient not taking: Reported on 11/03/2017 08/25/17   Biagio Borg, MD  methylphenidate (METADATE CD) 40 MG CR capsule Take 1 capsule (40 mg total) by mouth every morning. 09/06/18   Biagio Borg, MD  triamcinolone (NASACORT) 55 MCG/ACT AERO nasal inhaler Place 2 sprays into the nose daily. Patient not taking: Reported on 11/03/2017 08/25/17   Biagio Borg, MD  valACYclovir (VALTREX) 500 MG tablet Take 1 tablet (500 mg total) by mouth 2 (two) times daily. Patient not taking: Reported on 11/03/2017 08/25/17   Biagio Borg, MD    Family History Family History  Problem Relation Age of Onset  . Stroke Father   . Hypertension Father   . Gout Father   . Hyperlipidemia Father   . Colon cancer Father   . Asthma Mother   . Alcohol abuse Unknown        grandfather  . Breast cancer Unknown        grandmother  . Hypertension Unknown        grandmother  . Asthma Unknown        grandmother  . Breast cancer Maternal Grandmother   . Esophageal cancer Neg Hx   . Stomach cancer Neg Hx   . Rectal cancer Neg Hx     Social History Social History   Tobacco Use  . Smoking status: Never Smoker  . Smokeless tobacco: Never Used  Substance Use Topics  . Alcohol use: Yes    Alcohol/week: 2.0 standard drinks    Types: 2 Cans of beer per week  . Drug use: No     Allergies   Latex, Adhesive [tape], and Hydrocodone-homatropine   Review of Systems Review of Systems  Constitutional: Negative for diaphoresis and fever.  Respiratory: Negative for cough and shortness of breath.   Cardiovascular: Positive for chest pain. Negative for palpitations and leg swelling.  Gastrointestinal: Negative for abdominal pain and nausea.  All other systems reviewed and are negative.    Physical Exam Updated Vital Signs BP 132/90   Pulse (!) 50   Temp 97.7 F (36.5 C) (Oral)   Resp 17   LMP 01/20/2016   SpO2 96%   Physical Exam Vitals signs and nursing note  reviewed.  Constitutional:      General: She is not in acute distress.    Appearance: She is well-developed. She is not ill-appearing or diaphoretic.  HENT:     Head: Normocephalic and atraumatic.  Eyes:     Conjunctiva/sclera: Conjunctivae normal.  Neck:     Musculoskeletal: Normal range of motion and neck supple.  Cardiovascular:     Rate and Rhythm: Normal rate and regular rhythm.     Pulses: Normal pulses.  Pulmonary:  Effort: Pulmonary effort is normal. No respiratory distress.     Breath sounds: Normal breath sounds.  Chest:     Chest wall: Tenderness (Some tenderness over the left anterior chest and to the left upper back between the scapula and the spine.) present.  Abdominal:     General: Bowel sounds are normal.     Palpations: Abdomen is soft.     Tenderness: There is no abdominal tenderness. There is no guarding or rebound.  Musculoskeletal:     Right lower leg: No edema.     Left lower leg: No edema.     Comments: No lower extremity edema or calf tenderness.  Skin:    General: Skin is warm.  Neurological:     Mental Status: She is alert.  Psychiatric:        Behavior: Behavior normal.      ED Treatments / Results  Labs (all labs ordered are listed, but only abnormal results are displayed) Labs Reviewed  BASIC METABOLIC PANEL - Abnormal; Notable for the following components:      Result Value   Glucose, Bld 116 (*)    All other components within normal limits  CBC  TROPONIN I (HIGH SENSITIVITY)  TROPONIN I (HIGH SENSITIVITY)    EKG EKG Interpretation  Date/Time:  Friday November 16 2018 23:35:30 EDT Ventricular Rate:  77 PR Interval:  190 QRS Duration: 84 QT Interval:  404 QTC Calculation: 457 R Axis:   21 Text Interpretation:  Normal sinus rhythm Normal ECG Confirmed by Lennice Sites [phone]) on 11/17/2018 9:53:14 AM   Radiology Dg Chest 2 View  Result Date: 11/17/2018 CLINICAL DATA:  Chest pain and shortness of breath.  Dizziness. EXAM:  CHEST - 2 VIEW COMPARISON:  04/12/2016 FINDINGS: The heart size and mediastinal contours are within normal limits. Both lungs are clear. The visualized skeletal structures are unremarkable. IMPRESSION: Negative.  No active cardiopulmonary disease. Electronically Signed   By: Marlaine Hind M.D.   On: 11/17/2018 09:40    Procedures Procedures (including critical care time)  Medications Ordered in ED Medications  sodium chloride flush (NS) 0.9 % injection 3 mL (has no administration in time range)  alum & mag hydroxide-simeth (MAALOX/MYLANTA) 200-200-20 MG/5ML suspension 15 mL (15 mLs Oral Given 11/17/18 0933)  lidocaine (XYLOCAINE) 2 % viscous mouth solution 15 mL (15 mLs Mouth/Throat Given 11/17/18 0934)     Initial Impression / Assessment and Plan / ED Course  I have reviewed the triage vital signs and the nursing notes.  Pertinent labs & imaging results that were available during my care of the patient were reviewed by me and considered in my medical decision making (see chart for details).        Pt presenting with sharp chest pain, worse with movement and palpation since last night. She states she feels like it is heartburn. Pt given GI cocktail in ED. Chest pain is not likely of cardiac or pulmonary etiology d/t presentation, PERC negative, VSS, no tracheal deviation, no JVD or new murmur, RRR, breath sounds equal bilaterally, EKG without acute abnormalities, negative troponin, and negative CXR. Patient is to be discharged with recommendation to follow up with PCP in regards to today's hospital visit. Pt has been advised to return to the ED if CP becomes exertional, associated with diaphoresis or nausea, radiates to left jaw/arm, worsens or becomes concerning in any way. Pt appears reliable for follow up and is agreeable to discharge.   Discussed patient with Dr. Ronnald Nian, who agrees  with workup and care plan.  Discussed results, findings, treatment and follow up. Patient advised of  return precautions. Patient verbalized understanding and agreed with plan.  Final Clinical Impressions(s) / ED Diagnoses   Final diagnoses:  Atypical chest pain    ED Discharge Orders    None       Robinson, [place] N, PA-C 11/17/18 [place], [place], DO 11/17/18 2158

## 2018-11-17 NOTE — ED Notes (Signed)
Patient verbalizes understanding of discharge instructions. Opportunity for questioning and answers were provided. Armband removed by staff, pt discharged from ED.  

## 2018-11-17 NOTE — ED Notes (Signed)
ED Provider at bedside. 

## 2018-11-17 NOTE — ED Notes (Signed)
Patient transported to X-ray 

## 2018-11-17 NOTE — ED Notes (Signed)
Pt's sister, Trevor Mace [phone]

## 2018-12-11 ENCOUNTER — Other Ambulatory Visit: Payer: Self-pay | Admitting: Internal Medicine

## 2018-12-11 NOTE — Telephone Encounter (Signed)
Medication Refill - Medication: amphetamine-dextroamphetamine (ADDERALL) 5 MG tablet methylphenidate (METADATE CD) 40 MG CR capsule  Has the patient contacted their pharmacy? No - has to contact PCP (Agent: If no, request that the patient contact the pharmacy for the refill.) (Agent: If yes, when and what did the pharmacy advise?)  Preferred Pharmacy (with phone number or street name):  [place], [place]. [phone] (Phone) [phone] (Fax)   Agent: Please be advised that RX refills may take up to 3 business days. We ask that you follow-up with your pharmacy.

## 2018-12-12 MED ORDER — AMPHETAMINE-DEXTROAMPHETAMINE 5 MG PO TABS: 5.0000 mg | ORAL_TABLET | Freq: Every day | ORAL | 0 refills | Status: DC | PRN

## 2018-12-12 NOTE — Addendum Note (Signed)
Addended by: Biagio Borg on: 12/12/2018 12:54 PM   Modules accepted: Orders

## 2018-12-12 NOTE — Telephone Encounter (Signed)
Done erx 

## 2018-12-26 NOTE — Addendum Note (Signed)
Addended by: Biagio Borg on: 12/26/2018 03:39 PM   Modules accepted: Orders

## 2018-12-26 NOTE — Telephone Encounter (Signed)
Please contact pt to advise

## 2018-12-26 NOTE — Telephone Encounter (Signed)
Please send pending medication, was not sent with adderall. Thanks!

## 2018-12-26 NOTE — Telephone Encounter (Signed)
Ideally should only be on one or the other, so I picked the one just recently filled

## 2018-12-26 NOTE — Addendum Note (Signed)
Addended by: Terence Lux B on: 12/26/2018 02:52 PM   Modules accepted: Orders

## 2018-12-26 NOTE — Telephone Encounter (Signed)
Pt has adderall from sept 9  Ritalin refill today is not appropriate

## 2018-12-26 NOTE — Telephone Encounter (Signed)
Is the pt only suppose to be taking Adderall and no longer taking Ritalin? Her last refill of Ritalin was on 09/06/18. Please advise.

## 2018-12-27 MED ORDER — METHYLPHENIDATE HCL ER (CD) 40 MG PO CPCR: 40.0000 mg | ORAL_CAPSULE | ORAL | 0 refills | Status: DC

## 2018-12-27 MED ORDER — AMPHETAMINE-DEXTROAMPHETAMINE 5 MG PO TABS: 5.0000 mg | ORAL_TABLET | Freq: Every day | ORAL | 0 refills | Status: DC | PRN

## 2018-12-27 NOTE — Telephone Encounter (Signed)
Attempted to call and inform pt. VM is full.

## 2018-12-27 NOTE — Telephone Encounter (Signed)
Spoke with pt,she is very upset and informed me that yall had a conversation during one of her visits that she should take both medications. She stated the Ritalin last her through her 8 hour work day and then she would take the Adderall 5mg  when that wore off since that only benefited her for 4 hours. . She stated that if you are going to take away something then to take her off the Adderall NOT the Ritalin. She would like for you to send it the Ritalin. Please advise.

## 2018-12-27 NOTE — Telephone Encounter (Signed)
OK, I understand now, sorry for the misunderstanding, as this is an unusual treatment  Ok for both - I will do

## 2018-12-27 NOTE — Addendum Note (Signed)
Addended by: Biagio Borg on: 12/27/2018 11:08 AM   Modules accepted: Orders

## 2019-02-21 ENCOUNTER — Other Ambulatory Visit: Payer: Self-pay | Admitting: Internal Medicine

## 2019-02-21 NOTE — Telephone Encounter (Signed)
Requested medication (s) are due for refill today: yes  Requested medication (s) are on the active medication list: yes  Last refill: 12/27/2018 both meds  Future visit scheduled:no  Notes to clinic: Medications not delegated    Requested Prescriptions  Pending Prescriptions Disp Refills   amphetamine-dextroamphetamine (ADDERALL) 5 MG tablet 30 tablet 0    Sig: Take 1 tablet (5 mg total) by mouth daily as needed.     Not Delegated - Psychiatry:  Stimulants/ADHD Failed - 02/21/2019  5:02 PM      Failed - This refill cannot be delegated      Failed - Urine Drug Screen completed in last 360 days.      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     Not Delegated - Psychiatry:  Stimulants/ADHD Failed - 02/21/2019  5:02 PM      Failed - This refill cannot be delegated      Failed - Urine Drug Screen completed in last 360 days.      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

## 2019-02-21 NOTE — Telephone Encounter (Signed)
Copied from [place] [phone]. Topic: Quick Communication - Rx Refill/Question >> Feb 21, 2019  3:54 PM Keene Breath wrote: Medication: amphetamine-dextroamphetamine (ADDERALL) 5 MG tablet and methylphenidate (METADATE CD) 40 MG CR capsule   Preferred Pharmacy (with phone number or street name): [place], [place]. [phone] (Phone) [phone] (Fax)    Agent: Please be advised that RX refills may take up to 3 business days. We ask that you follow-up with your pharmacy.

## 2019-02-22 MED ORDER — AMPHETAMINE-DEXTROAMPHETAMINE 5 MG PO TABS: 5.0000 mg | ORAL_TABLET | Freq: Every day | ORAL | 0 refills | Status: DC | PRN

## 2019-02-22 MED ORDER — METHYLPHENIDATE HCL ER (CD) 40 MG PO CPCR: 40.0000 mg | ORAL_CAPSULE | ORAL | 0 refills | Status: DC

## 2019-02-22 NOTE — Telephone Encounter (Signed)
Pt has been informed that we do not offer blood type testing here and that she would have to find out from a blood bank/donate blood.  She also requested medications that I have dropped.    Copied from [place] [phone]. Topic: General - Other >> Feb 21, 2019  3:56 PM Keene Breath wrote: Reason for CRM: Patient requests a call from the nurse regarding her blood type.  She stated she could not get it from My Chart.  Please call patient to discuss at [phone]

## 2019-02-22 NOTE — Telephone Encounter (Signed)
Done erx 

## 2019-03-11 ENCOUNTER — Telehealth: Payer: Self-pay | Admitting: Internal Medicine

## 2019-03-11 NOTE — Telephone Encounter (Signed)
Copied from [place] [phone]. Topic: Quick Communication - Rx Refill/Question >> Mar 11, 2019  3:28 PM Izola Price, [place] A wrote: Medication: amphetamine-dextroamphetamine (ADDERALL) 5 MG tablet, methylphenidate (METADATE CD) 40 MG CR capsule(Patient stated that he insurance company is needing authorization on the medication in order for pharmacy to fill medication. )  Has the patient contacted their pharmacy? Yes (Agent: If no, request that the patient contact the pharmacy for the refill.) (Agent: If yes, when and what did the pharmacy advise?)  Preferred Pharmacy (with phone number or street name):[facility] [place], [place]. [phone] (Phone) [phone] (Fax)    Agent: Please be advised that RX refills may take up to 3 business days. We ask that you follow-up with your pharmacy.

## 2019-03-12 NOTE — Telephone Encounter (Signed)
It is too soon for a refill, so I think this is a Pre-authorization, thanks

## 2019-03-12 NOTE — Telephone Encounter (Signed)
Key: [id number]  KeyPT:[id number]

## 2019-05-06 ENCOUNTER — Telehealth: Payer: Self-pay | Admitting: Internal Medicine

## 2019-05-06 MED ORDER — AMPHETAMINE-DEXTROAMPHETAMINE 5 MG PO TABS: 5.0000 mg | ORAL_TABLET | Freq: Every day | ORAL | 0 refills | Status: DC | PRN

## 2019-05-06 NOTE — Telephone Encounter (Signed)
Done erx 

## 2019-05-06 NOTE — Telephone Encounter (Signed)
Called patient unable to leave voicemail about prescription

## 2019-05-06 NOTE — Telephone Encounter (Signed)
Patient is requesting refill on adderall.  Patient is currently sick and not able to come into the office.  Patient will call back later on to see if rx was sent.

## 2019-05-06 NOTE — Telephone Encounter (Signed)
Please advise 

## 2019-05-16 ENCOUNTER — Telehealth: Payer: Self-pay

## 2019-05-16 NOTE — Telephone Encounter (Signed)
New message    1. Which medications need to be refilled? (please list name of each medication and dose if known) methylphenidate (METADATE CD) 40 MG CR capsule  2. Which pharmacy/location (including street and city if local pharmacy) is medication to be sent to?[facility] [address]   3. Do they need a 30 day or 90 day supply? 30 day supply

## 2019-05-17 MED ORDER — METHYLPHENIDATE HCL ER (CD) 40 MG PO CPCR: 40.0000 mg | ORAL_CAPSULE | ORAL | 0 refills | Status: DC

## 2019-05-17 NOTE — Telephone Encounter (Signed)
Done erx 

## 2019-06-17 ENCOUNTER — Encounter: Payer: BC Managed Care – PPO | Admitting: Internal Medicine

## 2019-08-09 ENCOUNTER — Ambulatory Visit: Payer: BC Managed Care – PPO | Admitting: Internal Medicine

## 2019-08-14 ENCOUNTER — Encounter: Payer: Self-pay | Admitting: Internal Medicine

## 2019-08-14 ENCOUNTER — Other Ambulatory Visit: Payer: Self-pay

## 2019-08-14 ENCOUNTER — Ambulatory Visit: Payer: BC Managed Care – PPO | Admitting: Internal Medicine

## 2019-08-14 VITALS — BP 120/80 | HR 84 | Temp 98.3°F | Ht 64.0 in | Wt 199.0 lb

## 2019-08-14 DIAGNOSIS — Z0001 Encounter for general adult medical examination with abnormal findings: Secondary | ICD-10-CM | POA: Diagnosis not present

## 2019-08-14 DIAGNOSIS — G4710 Hypersomnia, unspecified: Secondary | ICD-10-CM

## 2019-08-14 DIAGNOSIS — E559 Vitamin D deficiency, unspecified: Secondary | ICD-10-CM

## 2019-08-14 DIAGNOSIS — F909 Attention-deficit hyperactivity disorder, unspecified type: Secondary | ICD-10-CM | POA: Diagnosis not present

## 2019-08-14 DIAGNOSIS — R739 Hyperglycemia, unspecified: Secondary | ICD-10-CM

## 2019-08-14 DIAGNOSIS — F419 Anxiety disorder, unspecified: Secondary | ICD-10-CM | POA: Diagnosis not present

## 2019-08-14 DIAGNOSIS — K219 Gastro-esophageal reflux disease without esophagitis: Secondary | ICD-10-CM

## 2019-08-14 DIAGNOSIS — K5900 Constipation, unspecified: Secondary | ICD-10-CM | POA: Diagnosis not present

## 2019-08-14 DIAGNOSIS — E538 Deficiency of other specified B group vitamins: Secondary | ICD-10-CM

## 2019-08-14 LAB — URINALYSIS, ROUTINE W REFLEX MICROSCOPIC
Bilirubin Urine: NEGATIVE
Hgb urine dipstick: NEGATIVE
Ketones, ur: NEGATIVE
Leukocytes,Ua: NEGATIVE
Nitrite: NEGATIVE
RBC / HPF: NONE SEEN (ref 0–?)
Specific Gravity, Urine: 1.025 (ref 1.000–1.030)
Total Protein, Urine: NEGATIVE
Urine Glucose: NEGATIVE
Urobilinogen, UA: 0.2 (ref 0.0–1.0)
pH: 6.5 (ref 5.0–8.0)

## 2019-08-14 MED ORDER — LINACLOTIDE 72 MCG PO CAPS: 72.0000 ug | ORAL_CAPSULE | Freq: Every day | ORAL | 5 refills | Status: AC | PRN

## 2019-08-14 MED ORDER — AMPHETAMINE-DEXTROAMPHETAMINE 5 MG PO TABS: 5.0000 mg | ORAL_TABLET | Freq: Every day | ORAL | 0 refills | Status: AC | PRN

## 2019-08-14 MED ORDER — METHYLPHENIDATE HCL ER (CD) 40 MG PO CPCR: 40.0000 mg | ORAL_CAPSULE | ORAL | 0 refills | Status: AC

## 2019-08-14 MED ORDER — PANTOPRAZOLE SODIUM 40 MG PO TBEC: 40.0000 mg | DELAYED_RELEASE_TABLET | Freq: Every day | ORAL | 3 refills | Status: AC

## 2019-08-14 NOTE — Assessment & Plan Note (Signed)
Asked pt to call with name of med she is referring to

## 2019-08-14 NOTE — Assessment & Plan Note (Signed)
For linzess prn

## 2019-08-14 NOTE — Assessment & Plan Note (Signed)

## 2019-08-14 NOTE — Addendum Note (Signed)
Addended by: Biagio Borg on: 08/14/2019 10:28 PM   Modules accepted: Orders

## 2019-08-14 NOTE — Patient Instructions (Signed)
Please let us know the medication you were referring to for stress, and continue your counseling  Please take all new medication as prescribed - the protonix and linzess  Please also continue the miralax OTC as needed  Please continue all other medications as before, and refills have been done if requested.  Please have the pharmacy call with any other refills you may need.  Please continue your efforts at being more active, low cholesterol diet, and weight control.  You are otherwise up to date with prevention measures today.  Please keep your appointments with your specialists as you may have planned  Please go to the LAB at the blood drawing area for the tests to be done  You will be contacted by phone if any changes need to be made immediately.  Otherwise, you will receive a letter about your results with an explanation, but please check with MyChart first.  Please remember to sign up for MyChart if you have not done so, as this will be important to you in the future with finding out test results, communicating by private email, and scheduling acute appointments online when needed.  Please make an Appointment to return in 6 months, or sooner if needed

## 2019-08-14 NOTE — Assessment & Plan Note (Signed)
stable overall by history and exam, recent data reviewed with pt, and pt to continue medical treatment as before,  to f/u any worsening symptoms or concerns  

## 2019-08-14 NOTE — Progress Notes (Signed)
Subjective:    Patient ID: Kellie Curtis, female    DOB: 26-Aug-1970, 49 y.o.   MRN: [id number]  HPI  Here for wellness and f/u;  Overall doing ok;  Pt denies Chest pain, worsening SOB, DOE, wheezing, orthopnea, PND, worsening LE edema, palpitations, dizziness or syncope.  Pt denies neurological change such as new headache, facial or extremity weakness.  Pt denies polydipsia, polyuria, or low sugar symptoms. Pt states overall good compliance with treatment and medications, good tolerability, and has been trying to follow appropriate diet.  Pt denies worsening depressive symptoms, suicidal ideation or panic. No fever, night sweats, wt loss, loss of appetite, or other constitutional symptoms.  Pt states good ability with ADL's, has low fall risk, home safety reviewed and adequate, no other significant changes in hearing or vision, and only occasionally active with exercise. Separated now, with increased mood swings, no SI or HI, having some what sounds lke emotional lability but no behavioral issues Took herself off celexa as concerned it made her have suicidal thoughts.  A friend Minus Liberty suggested a med but she is not sure of the name.  aDD Med working well, needs refill. Does gave mild worsening reflux, but no abd pain, dysphagia, n/v, bowel change or blood except for worsening constipation not well controlled with miralax.   Past Medical History:  Diagnosis Date  . Abdominal pain, left lower quadrant   . ADHD (attention deficit hyperactivity disorder)   . Allergic rhinitis   . Anemia   . Anxiety state, unspecified   . Asthma    Controlled  . Depression   . Dyslexia   . Dysmenorrhea   . Elevated blood pressure reading without diagnosis of hypertension   . GERD (gastroesophageal reflux disease)   . History of adenomatous polyp of colon    tubular adenoma's 2012;  2017  . Hyperlipidemia   . IBS (irritable bowel syndrome)   . Menorrhagia   . Other convulsions   . Seizure disorder ([place])     . Uterus, adenomyosis    Past Surgical History:  Procedure Laterality Date  . CESAREAN SECTION  x2  last one 02-22-2005   Bilateral Tubal Ligation w/ last one  . COLONOSCOPY  last one 11-27-2015  . LAPAROSCOPIC VAGINAL HYSTERECTOMY WITH SALPINGECTOMY Bilateral 02/01/2016   Procedure: LAPAROSCOPIC ASSISTED VAGINAL HYSTERECTOMY WITH SALPINGECTOMY;  Surgeon: Arvella Nigh, MD;  Location: [facility];  Service: Gynecology;  Laterality: Bilateral;  . TONSILLECTOMY      reports that she has never smoked. She has never used smokeless tobacco. She reports current alcohol use of about 2.0 standard drinks of alcohol per week. She reports that she does not use drugs. family history includes Alcohol abuse in her unknown relative; Asthma in her mother and unknown relative; Breast cancer in her maternal grandmother and unknown relative; Colon cancer in her father; Gout in her father; Hyperlipidemia in her father; Hypertension in her father and unknown relative; Stroke in her father. Allergies  Allergen Reactions  . Latex Rash  . Adhesive [Tape] Rash  . Hydrocodone-Homatropine Itching   Current Outpatient Medications on File Prior to Visit  Medication Sig Dispense Refill  . valACYclovir (VALTREX) 500 MG tablet Take 500 mg by mouth 2 (two) times daily.     No current facility-administered medications on file prior to visit.   Review of Systems All otherwise neg per pt     Objective:   Physical Exam BP 120/80 (BP Location: Left Arm, Patient Position: Sitting, Cuff Size:  Large)   Pulse 84   Temp 98.3 F (36.8 C) (Oral)   Ht 5\' 4"  (1.626 m)   Wt 199 lb (90.3 kg)   LMP 01/20/2016   SpO2 97%   BMI 34.16 kg/m  VS noted,  Constitutional: Pt appears in NAD HENT: Head: NCAT.  Right Ear: External ear normal.  Left Ear: External ear normal.  Eyes: . Pupils are equal, round, and reactive to light. Conjunctivae and EOM are normal Nose: without d/c or deformity Neck: Neck supple.  Gross normal ROM Cardiovascular: Normal rate and regular rhythm.   Pulmonary/Chest: Effort normal and breath sounds without rales or wheezing.  Abd:  Soft, NT, ND, + BS, no organomegaly Neurological: Pt is alert. At baseline orientation, motor grossly intact Skin: Skin is warm. No rashes, other new lesions, no LE edema Psychiatric: Pt behavior is normal without agitation  All otherwise neg per pt Lab Results  Component Value Date   WBC 6.1 11/16/2018   HGB 14.1 11/16/2018   HCT 43.4 11/16/2018   PLT 349 11/16/2018   GLUCOSE 116 (H) 11/16/2018   CHOL 199 03/16/2017   TRIG 248.0 (H) 03/16/2017   HDL 94.60 03/16/2017   LDLDIRECT 86.0 03/16/2017   LDLCALC 74 12/23/2015   ALT 15 03/16/2017   AST 18 03/16/2017   NA 140 11/16/2018   K 4.3 11/16/2018   CL 102 11/16/2018   CREATININE 0.92 11/16/2018   BUN 12 11/16/2018   CO2 26 11/16/2018   TSH 1.27 03/16/2017   INR 1.0 02/11/2008      Assessment & Plan:

## 2019-08-14 NOTE — Assessment & Plan Note (Addendum)
With worsening, for protonix 40 qd  I spent 31 minutes in addition to time for CPX wellness examination in preparing to see the patient by review of recent labs, imaging and procedures, obtaining and reviewing separately obtained history, communicating with the patient and family or caregiver, ordering medications, tests or procedures, and documenting clinical information in the EHR including the differential Dx, treatment, and any further evaluation and other management of gerd, constipation, add, anxiety

## 2019-08-15 NOTE — Addendum Note (Signed)
Addended by: Cresenciano Lick on: 08/15/2019 08:22 AM   Modules accepted: Orders

## 2019-08-19 ENCOUNTER — Other Ambulatory Visit: Payer: Self-pay | Admitting: Internal Medicine

## 2019-08-21 ENCOUNTER — Observation Stay (HOSPITAL_COMMUNITY)
Admission: EM | Admit: 2019-08-21 | Discharge: 2019-08-22 | Disposition: A | Discharge status: Home / Self Care | Payer: BC Managed Care – PPO | Attending: Internal Medicine | Admitting: Internal Medicine

## 2019-08-21 ENCOUNTER — Other Ambulatory Visit: Payer: Self-pay

## 2019-08-21 ENCOUNTER — Emergency Department (HOSPITAL_COMMUNITY): Payer: BC Managed Care – PPO

## 2019-08-21 ENCOUNTER — Encounter (HOSPITAL_COMMUNITY): Payer: Self-pay | Admitting: Emergency Medicine

## 2019-08-21 DIAGNOSIS — F909 Attention-deficit hyperactivity disorder, unspecified type: Secondary | ICD-10-CM | POA: Diagnosis not present

## 2019-08-21 DIAGNOSIS — R110 Nausea: Secondary | ICD-10-CM | POA: Insufficient documentation

## 2019-08-21 DIAGNOSIS — Z91040 Latex allergy status: Secondary | ICD-10-CM | POA: Insufficient documentation

## 2019-08-21 DIAGNOSIS — K580 Irritable bowel syndrome with diarrhea: Secondary | ICD-10-CM | POA: Insufficient documentation

## 2019-08-21 DIAGNOSIS — K623 Rectal prolapse: Secondary | ICD-10-CM | POA: Diagnosis not present

## 2019-08-21 DIAGNOSIS — E785 Hyperlipidemia, unspecified: Secondary | ICD-10-CM | POA: Diagnosis not present

## 2019-08-21 DIAGNOSIS — G40909 Epilepsy, unspecified, not intractable, without status epilepticus: Secondary | ICD-10-CM | POA: Insufficient documentation

## 2019-08-21 DIAGNOSIS — Z885 Allergy status to narcotic agent status: Secondary | ICD-10-CM | POA: Diagnosis not present

## 2019-08-21 DIAGNOSIS — R1084 Generalized abdominal pain: Secondary | ICD-10-CM | POA: Insufficient documentation

## 2019-08-21 DIAGNOSIS — D649 Anemia, unspecified: Secondary | ICD-10-CM | POA: Insufficient documentation

## 2019-08-21 DIAGNOSIS — J45909 Unspecified asthma, uncomplicated: Secondary | ICD-10-CM | POA: Insufficient documentation

## 2019-08-21 DIAGNOSIS — K625 Hemorrhage of anus and rectum: Principal | ICD-10-CM | POA: Diagnosis present

## 2019-08-21 DIAGNOSIS — Z79899 Other long term (current) drug therapy: Secondary | ICD-10-CM | POA: Diagnosis not present

## 2019-08-21 DIAGNOSIS — Z20822 Contact with and (suspected) exposure to covid-19: Secondary | ICD-10-CM | POA: Insufficient documentation

## 2019-08-21 DIAGNOSIS — K922 Gastrointestinal hemorrhage, unspecified: Secondary | ICD-10-CM

## 2019-08-21 DIAGNOSIS — F419 Anxiety disorder, unspecified: Secondary | ICD-10-CM | POA: Diagnosis present

## 2019-08-21 LAB — CBC
HCT: 39.2 % (ref 36.0–46.0)
HCT: 39.1 % (ref 36.0–46.0)
Hemoglobin: 13 g/dL (ref 12.0–15.0)
Hemoglobin: 12.6 g/dL (ref 12.0–15.0)
MCH: 31.4 pg (ref 26.0–34.0)
MCH: 30.6 pg (ref 26.0–34.0)
MCHC: 33.2 g/dL (ref 30.0–36.0)
MCHC: 32.2 g/dL (ref 30.0–36.0)
MCV: 94.7 fL (ref 80.0–100.0)
MCV: 94.9 fL (ref 80.0–100.0)
Platelets: 332 10*3/uL (ref 150–400)
Platelets: 327 10*3/uL (ref 150–400)
RBC: 4.14 MIL/uL (ref 3.87–5.11)
RBC: 4.12 MIL/uL (ref 3.87–5.11)
RDW: 12.6 % (ref 11.5–15.5)
RDW: 12.6 % (ref 11.5–15.5)
WBC: 5.1 10*3/uL (ref 4.0–10.5)
WBC: 5.9 10*3/uL (ref 4.0–10.5)
nRBC: 0 % (ref 0.0–0.2)
nRBC: 0 % (ref 0.0–0.2)

## 2019-08-21 LAB — COMPREHENSIVE METABOLIC PANEL
ALT: 12 U/L (ref 0–44)
AST: 14 U/L — ABNORMAL LOW (ref 15–41)
Albumin: 4.2 g/dL (ref 3.5–5.0)
Alkaline Phosphatase: 30 U/L — ABNORMAL LOW (ref 38–126)
Anion gap: 8 (ref 5–15)
BUN: 11 mg/dL (ref 6–20)
CO2: 26 mmol/L (ref 22–32)
Calcium: 8.8 mg/dL — ABNORMAL LOW (ref 8.9–10.3)
Chloride: 105 mmol/L (ref 98–111)
Creatinine, Ser: 0.76 mg/dL (ref 0.44–1.00)
GFR calc Af Amer: >60 mL/min (ref >60)
GFR calc non Af Amer: >60 mL/min (ref >60)
Glucose, Bld: 100 mg/dL — ABNORMAL HIGH (ref 70–99)
Potassium: 3.8 mmol/L (ref 3.5–5.1)
Sodium: 139 mmol/L (ref 135–145)
Total Bilirubin: 0.5 mg/dL (ref 0.3–1.2)
Total Protein: 7.3 g/dL (ref 6.5–8.1)

## 2019-08-21 LAB — TYPE AND SCREEN
ABO/RH(D): A POS
Antibody Screen: NEGATIVE

## 2019-08-21 LAB — TSH: TSH: 1.258 u[IU]/mL (ref 0.350–4.500)

## 2019-08-21 LAB — HIV ANTIBODY (ROUTINE TESTING W REFLEX): HIV Screen 4th Generation wRfx: NONREACTIVE

## 2019-08-21 LAB — POC OCCULT BLOOD, ED: Fecal Occult Bld: POSITIVE — AB

## 2019-08-21 LAB — MAGNESIUM: Magnesium: 2 mg/dL (ref 1.7–2.4)

## 2019-08-21 LAB — SARS CORONAVIRUS 2 BY RT PCR (HOSPITAL ORDER, PERFORMED IN ~~LOC~~ HOSPITAL LAB): SARS Coronavirus 2: NEGATIVE

## 2019-08-21 MED ORDER — VALACYCLOVIR HCL 500 MG PO TABS
500.0000 mg | ORAL_TABLET | Freq: Two times a day (BID) | ORAL | Status: DC | PRN
  Filled 2019-08-21: qty 1

## 2019-08-21 MED ORDER — IOHEXOL 300 MG/ML  SOLN
100.0000 mL | Freq: Once | INTRAMUSCULAR | Status: AC | PRN
  Administered 2019-08-21: 100 mL via INTRAVENOUS

## 2019-08-21 MED ORDER — ACETAMINOPHEN 325 MG PO TABS: 650.0000 mg | ORAL_TABLET | Freq: Four times a day (QID) | ORAL | Status: DC | PRN

## 2019-08-21 MED ORDER — AMPHETAMINE-DEXTROAMPHETAMINE 10 MG PO TABS
5.0000 mg | ORAL_TABLET | Freq: Every day | ORAL | Status: DC | PRN
  Filled 2019-08-21: qty 1

## 2019-08-21 MED ORDER — ONDANSETRON HCL 4 MG/2ML IJ SOLN: 4.0000 mg | Freq: Four times a day (QID) | INTRAMUSCULAR | Status: DC | PRN

## 2019-08-21 MED ORDER — HYDROCORTISONE ACETATE 25 MG RE SUPP
25.0000 mg | Freq: Two times a day (BID) | RECTAL | Status: DC
  Administered 2019-08-21: 25 mg via RECTAL
  Filled 2019-08-21 (×2): qty 1

## 2019-08-21 MED ORDER — METHYLPHENIDATE HCL ER 36 MG PO TB24
36.0000 mg | ORAL_TABLET | Freq: Every day | ORAL | Status: DC | PRN
  Filled 2019-08-21: qty 1

## 2019-08-21 MED ORDER — SODIUM CHLORIDE (PF) 0.9 % IJ SOLN
INTRAMUSCULAR | Status: AC
  Filled 2019-08-21: qty 50

## 2019-08-21 MED ORDER — ONDANSETRON HCL 4 MG PO TABS: 4.0000 mg | ORAL_TABLET | Freq: Four times a day (QID) | ORAL | Status: DC | PRN

## 2019-08-21 MED ORDER — ACETAMINOPHEN 650 MG RE SUPP: 650.0000 mg | Freq: Four times a day (QID) | RECTAL | Status: DC | PRN

## 2019-08-21 MED ORDER — SODIUM CHLORIDE 0.9 % IV SOLN: INTRAVENOUS | Status: DC

## 2019-08-21 NOTE — Consult Note (Addendum)
Referring Provider:  [facility]         Primary Care Physician:  Biagio Borg, MD Primary Gastroenterologist: Harl Bowie, MD              We were asked to see this patient for:    Rectal bleeding             ASSESSMENT /  PLAN    49 yo female with pmh significant for GERD, small hiatal hernia, adenomatous colon polyps, IBS / chronic constipation, ADHD, dyslexia, hysterectomy  # Rectal bleeding --Chronic constipation but adamant that onset of bleeding not associated with passage of hard stool or straining.  --Some associated diffuse lower abdominal discomfort though no bowel wall thickening / inflammation on CT scan to suggest ischemic colitis.  Also no diverticula seen on imaging making diverticular bleed seem unlikely. --She has significant discomfort on DRE so fissure possible. Internal hemorrhoid bleeding also possible though neither of these usually result in clots they don't explain the lower abdominal discomfort ( which is different than the discomfort she gets with IBS).  --I believe that the ED plans to ask [facility] to put patient in hospital under observation status. Will see how thing go, she may need a colonoscopy.  --Hgb is normal but is is down some from 14 in August 2020 to 13.  --Clear liquids only for now  # History of adenomatous colon polyps --She is due for 5 year surveillance colonoscopy Sept 2022. Will likely get this done sooner given the onset of rectal bleeding.  --Father had CRC but sounds like it was diagnosed when he was over 20 years of age.   # Rectal and vaginal prolapse on CT scan     HPI:    Chief Complaint: rectal bleeding  Kellie Curtis is a 49 y.o. female in ED with rectal bleeding.  Yesterday afternoon around 5:30 PM patient had a bowel movement which contained bright red blood.  She subsequently had a few more bowel movements containing bright red blood but at some point just began passing bright red blood.  She woke up around 2 AM  with urge to have a bowel movement but only passed blood clots.  She generally suffers with constipation so having several bowel movements or urges a day is unusual for her .She complains of associated diffuse lower abdominal discomfort / bloated sensation and believes symptoms started just prior to the bleeding yesterday.  Last week patient took Miralax and Dulcolax enemas with some response. She doesn't feel like bleeding is related to constipation since it started with passage of a soft stool in absence of straining. PCP recently prescribed Linzess but patient says she hasn't yet felt need to start it. She has never had rectal bleeding before.  No associated nausea vomiting other than that which occurred around the time of CT scan today. She is anxious about the bleeding, scared it represents colon cancer.   ED evaluation: Hemodynamically stable.  CBC normal with hemoglobin of 13.  Last hemoglobin August 2020 was 14.1.  CMP unremarkable  Past Medical History:  Diagnosis Date  . ADHD (attention deficit hyperactivity disorder)   . Allergic rhinitis   . Anemia   . Anxiety state, unspecified   . Asthma    Controlled  . Depression   . Dyslexia   . Dysmenorrhea   . Elevated blood pressure reading without diagnosis of hypertension   . GERD (gastroesophageal reflux disease)   . History of adenomatous polyp of colon  tubular adenoma's 2012;  2017  . Hyperlipidemia   . IBS (irritable bowel syndrome)   . Menorrhagia   . Other convulsions   . Seizure disorder ([place])   . Uterus, adenomyosis     Past Surgical History:  Procedure Laterality Date  . CESAREAN SECTION  x2  last one 02-22-2005   Bilateral Tubal Ligation w/ last one  . COLONOSCOPY  last one 11-27-2015  . LAPAROSCOPIC VAGINAL HYSTERECTOMY WITH SALPINGECTOMY Bilateral 02/01/2016   Procedure: LAPAROSCOPIC ASSISTED VAGINAL HYSTERECTOMY WITH SALPINGECTOMY;  Surgeon: Arvella Nigh, MD;  Location: [facility];  Service:  Gynecology;  Laterality: Bilateral;  . TONSILLECTOMY      Prior to Admission medications   Medication Sig Start Date End Date Taking? Authorizing Provider  ACAI BERRY PO Take 1 tablet by mouth daily.   Yes [provider]  amphetamine-dextroamphetamine (ADDERALL) 5 MG tablet Take 1 tablet (5 mg total) by mouth daily as needed. Patient taking differently: Take 5 mg by mouth daily as needed (attention deficit).  08/14/19  Yes Biagio Borg, MD  Cyanocobalamin (VITAMIN B 12 PO) Take 1 tablet by mouth daily.   Yes [provider]  methylphenidate (METADATE CD) 40 MG CR capsule Take 1 capsule (40 mg total) by mouth every morning. Patient taking differently: Take 40 mg by mouth daily as needed (attention deficit).  08/14/19  Yes Biagio Borg, MD  Multiple Vitamins-Minerals (ALIVE WOMENS GUMMY PO) Take 1 tablet by mouth daily.   Yes [provider]  polyethylene glycol (MIRALAX / GLYCOLAX) 17 g packet Take 17 g by mouth daily.   Yes [provider]  valACYclovir (VALTREX) 500 MG tablet Take 1 tablet by mouth twice daily Patient taking differently: Take 500 mg by mouth 2 (two) times daily as needed (cold sores).  08/19/19  Yes Biagio Borg, MD  VITAMIN D PO Take 1 capsule by mouth daily.   Yes [provider]  linaclotide (LINZESS) 72 MCG capsule Take 1 capsule (72 mcg total) by mouth daily as needed. Patient taking differently: Take 72 mcg by mouth daily as needed (IBS).  08/14/19   Biagio Borg, MD  pantoprazole (PROTONIX) 40 MG tablet Take 1 tablet (40 mg total) by mouth daily. 08/14/19   Biagio Borg, MD    Current Facility-Administered Medications  Medication Dose Route Frequency Provider Last Rate Last Admin  . 0.9 %  sodium chloride infusion   Intravenous Continuous Darliss Cheney, MD      . acetaminophen (TYLENOL) tablet 650 mg  650 mg Oral Q6H PRN Darliss Cheney, MD       Or  . acetaminophen (TYLENOL) suppository 650 mg  650 mg Rectal Q6H PRN  Darliss Cheney, MD      . amphetamine-dextroamphetamine (ADDERALL) tablet 5 mg  5 mg Oral Daily PRN Darliss Cheney, MD      . methylphenidate (METADATE CD) CR capsule 40 mg  40 mg Oral Daily PRN Darliss Cheney, MD      . ondansetron (ZOFRAN) tablet 4 mg  4 mg Oral Q6H PRN Darliss Cheney, MD       Or  . ondansetron (ZOFRAN) injection 4 mg  4 mg Intravenous Q6H PRN Pahwani, Ravi, MD      . sodium chloride (PF) 0.9 % injection           . valACYclovir (VALTREX) tablet 500 mg  500 mg Oral BID PRN Darliss Cheney, MD       Current Outpatient Medications  Medication Sig Dispense Refill  . ACAI BERRY PO Take 1 tablet by mouth daily.    Marland Kitchen amphetamine-dextroamphetamine (ADDERALL) 5 MG tablet Take 1 tablet (5 mg total) by mouth daily as needed. (Patient taking differently: Take 5 mg by mouth daily as needed (attention deficit). ) 30 tablet 0  . Cyanocobalamin (VITAMIN B 12 PO) Take 1 tablet by mouth daily.    . methylphenidate (METADATE CD) 40 MG CR capsule Take 1 capsule (40 mg total) by mouth every morning. (Patient taking differently: Take 40 mg by mouth daily as needed (attention deficit). ) 30 capsule 0  . Multiple Vitamins-Minerals (ALIVE WOMENS GUMMY PO) Take 1 tablet by mouth daily.    . polyethylene glycol (MIRALAX / GLYCOLAX) 17 g packet Take 17 g by mouth daily.    . valACYclovir (VALTREX) 500 MG tablet Take 1 tablet by mouth twice daily (Patient taking differently: Take 500 mg by mouth 2 (two) times daily as needed (cold sores). ) 30 tablet 0  . VITAMIN D PO Take 1 capsule by mouth daily.    Marland Kitchen linaclotide (LINZESS) 72 MCG capsule Take 1 capsule (72 mcg total) by mouth daily as needed. (Patient taking differently: Take 72 mcg by mouth daily as needed (IBS). ) 30 capsule 5  . pantoprazole (PROTONIX) 40 MG tablet Take 1 tablet (40 mg total) by mouth daily. 90 tablet 3    Allergies as of 08/21/2019 - Review Complete 08/21/2019  Allergen Reaction Noted  . Latex Rash 02/01/2016  . Adhesive [tape]  Rash 11/16/2015  . Hydrocodone-homatropine Itching 05/22/2014    Family History  Problem Relation Age of Onset  . Stroke Father   . Hypertension Father   . Gout Father   . Hyperlipidemia Father   . Colon cancer Father   . Asthma Mother   . Alcohol abuse Other        grandfather  . Breast cancer Other        grandmother  . Hypertension Other        grandmother  . Asthma Other        grandmother  . Breast cancer Maternal Grandmother   . Esophageal cancer Neg Hx   . Stomach cancer Neg Hx   . Rectal cancer Neg Hx     Social History   Socioeconomic History  . Marital status: Single    Spouse name: Not on file  . Number of children: 3  . Years of education: Not on file  . Highest education level: Not on file  Occupational History    Employer: [employer]  Tobacco Use  . Smoking status: Never Smoker  . Smokeless tobacco: Never Used  Substance and Sexual Activity  . Alcohol use: Yes    Alcohol/week: 2.0 standard drinks    Types: 2 Cans of beer per week  . Drug use: No  . Sexual activity: Yes    Birth control/protection: Pill    Comment: 1ST INTERCOURSE- 15, PARTNERS- GREATER THAN 5  Other Topics Concern  . Not on file  Social History Narrative  . Not on file   Social Determinants of Health   Financial Resource Strain:   . Difficulty of Paying Living Expenses:   Food Insecurity:   . Worried About Charity fundraiser in the Last Year:   . Arboriculturist in the Last Year:   Transportation Needs:   . Film/video editor (Medical):   Marland Kitchen Lack of Transportation (Non-Medical):   Physical Activity:   .  Days of Exercise per Week:   . Minutes of Exercise per Session:   Stress:   . Feeling of Stress :   Social Connections:   . Frequency of Communication with Friends and Family:   . Frequency of Social Gatherings with Friends and Family:   . Attends Religious Services:   . Active Member of Clubs or Organizations:   . Attends Archivist Meetings:   Marland Kitchen  Marital Status:   Intimate Partner Violence:   . Fear of Current or Ex-Partner:   . Emotionally Abused:   Marland Kitchen Physically Abused:   . Sexually Abused:     Review of Systems: All systems reviewed and negative except where noted in HPI.  Physical Exam: Vital signs in last 24 hours: Temp:  [98.5 F (36.9 C)] 98.5 F (36.9 C) (05/19 0914) Pulse Rate:  [71-80] 71 (05/19 1306) Resp:  [18-20] 20 (05/19 1306) BP: (118-144)/(91-105) 144/105 (05/19 1306) SpO2:  [92 %-99 %] 98 % (05/19 1306)   General:   Alert, female in NAD Psych:  Pleasant, cooperative. Normal mood and affect. Eyes:  Pupils equal, sclera clear, no icterus.   Conjunctiva pink. Ears:  Normal auditory acuity. Nose:  No deformity, discharge,  or lesions. Neck:  Supple; no masses Lungs:  Clear throughout to auscultation.   No wheezes, crackles, or rhonchi.  Heart:  Regular rate and rhythm; no murmurs, no lower extremity edema Abdomen:  Soft, non-distended, mild diffuse tenderness. BS active, no palp mass   Rectal:  No stool or blood in vault. Small protrusion from anus which is likely a hemorrhoid. She had significant discomfort with DRE ( mainly anterior wall it seemed) so DRE was very limited.  A few areas of thickening in anal canal raising suspicion for internal hemorrhoids.  Msk:  Symmetrical without gross deformities. . Neurologic:  Alert and  oriented x4;  grossly normal neurologically. Skin:  Intact without significant lesions or rashes.   Intake/Output from previous day: No intake/output data recorded. Intake/Output this shift: No intake/output data recorded.  Lab Results: Recent Labs    08/21/19 1005  WBC 5.1  HGB 13.0  HCT 39.2  PLT 332   BMET Recent Labs    08/21/19 1005  NA 139  K 3.8  CL 105  CO2 26  GLUCOSE 100*  BUN 11  CREATININE 0.76  CALCIUM 8.8*   LFT Recent Labs    08/21/19 1005  PROT 7.3  ALBUMIN 4.2  AST 14*  ALT 12  ALKPHOS 30*  BILITOT 0.5   PT/INR No results for  input(s): LABPROT, INR in the last 72 hours. Hepatitis Panel No results for input(s): HEPBSAG, HCVAB, HEPAIGM, HEPBIGM in the last 72 hours.   . CBC Latest Ref Rng & Units 08/21/2019 11/16/2018 03/16/2017  WBC 4.0 - 10.5 K/uL 5.1 6.1 4.9  Hemoglobin 12.0 - 15.0 g/dL 13.0 14.1 13.4  Hematocrit 36.0 - 46.0 % 39.2 43.4 40.9  Platelets 150 - 400 K/uL 332 349 339.0    . CMP Latest Ref Rng & Units 08/21/2019 11/16/2018 03/16/2017  Glucose 70 - 99 mg/dL 100(H) 116(H) 84  BUN 6 - 20 mg/dL 11 12 15   Creatinine 0.44 - 1.00 mg/dL 0.76 0.92 0.99  Sodium 135 - 145 mmol/L 139 140 139  Potassium 3.5 - 5.1 mmol/L 3.8 4.3 4.2  Chloride 98 - 111 mmol/L 105 102 101  CO2 22 - 32 mmol/L 26 26 30   Calcium 8.9 - 10.3 mg/dL 8.8(L) 9.7 9.5  Total Protein 6.5 -  8.1 g/dL 7.3 - 7.7  Total Bilirubin 0.3 - 1.2 mg/dL 0.5 - 0.7  Alkaline Phos 38 - 126 U/L 30(L) - 32(L)  AST 15 - 41 U/L 14(L) - 18  ALT 0 - 44 U/L 12 - 15   Studies/Results: CT ABDOMEN PELVIS W CONTRAST  Result Date: 08/21/2019 CLINICAL DATA:  Lower abdominal pain with rectal bleeding EXAM: CT ABDOMEN AND PELVIS WITH CONTRAST TECHNIQUE: Multidetector CT imaging of the abdomen and pelvis was performed using the standard protocol following bolus administration of intravenous contrast. CONTRAST:  158mL OMNIPAQUE IOHEXOL 300 MG/ML  SOLN COMPARISON:  July 08, 2014 FINDINGS: Lower chest: There is bibasilar atelectatic change. Lung bases otherwise are clear. There is a small hiatal hernia. Hepatobiliary: No focal liver lesions are evident. Gallbladder wall is not appreciably thickened. There is no biliary duct dilatation. Pancreas: No pancreatic mass or inflammatory focus. Spleen: No splenic lesions are evident. Adrenals/Urinary Tract: Adrenals bilaterally appear normal. There is a cyst in the upper pole of the right kidney measuring 8 x 8 mm. There is no evident renal hydronephrosis on either side. There is no evident renal or ureteral calculus on either side.  Urinary bladder is midline with wall thickness within normal limits. Stomach/Bowel: There is no appreciable diverticular disease. There is no evident bowel wall or mesenteric thickening. There is moderate stool in the colon. The terminal ileum appears normal. No evident bowel obstruction. No free air or portal venous air. There is an apparent degree of rectal prolapse. Vascular/Lymphatic: No abdominal aortic aneurysm. No arterial vascular lesions are evident. Major venous structures appear patent. There is no evident adenopathy in the abdomen or pelvis. Reproductive: Uterus is absent. There is an apparent degree of vaginal prolapse. No pelvic mass evident. Other: Appendix appears normal. No abscess in the abdomen or pelvis. There is minimal ascitic fluid in the dependent portion of the pelvis. There is a small umbilical hernia containing only fat. Musculoskeletal: No blastic or lytic bone lesions. No intramuscular lesions evident. IMPRESSION: 1.  There is a degree of rectal and vaginal prolapse. 2. No bowel wall thickening or bowel obstruction. No abscess in the abdomen or pelvis. Appendix appears normal. 3. Rather minimal ascites in the dependent portion of the pelvis, a finding of uncertain significance. 4. Small hiatal hernia. Small umbilical hernia containing fat but no bowel. 5. No renal or ureteral calculus. No hydronephrosis. Urinary bladder wall thickness within normal limits. 6.  Uterus absent. Comment: If rectal bleeding persists, direct visualization of the colon may well be warranted.11 Electronically Signed   By: Lowella Grip III M.D.   On: 08/21/2019 12:00    Active Problems:   Anxiety   Attention deficit hyperactivity disorder (ADHD)   Rectal bleeding   Rectal prolapse    Tye Savoy, NP-C @  08/21/2019, 1:19 PM    ________________________________________________________________________  [facility] Heckler [facility] MD note:  I personally examined the patient, reviewed the data and agree with  the assessment and plan described above.  She moves her bowels 1-2 times per week, sometimes not at all during a week. This has particularly been a problem for her in the past 2-3 weeks. She does not take anything routinely to help with her bowel and knows she is usually chronically dehyrdrated.  She has crampy abd pains and bloating that is typical for constipation, IBS-C.  Her bleeding was minor, only with BM and she's noticed some discomfort at her anus as well.  On exam, she has some deflated external hemorrhoids, no  obvious fissures, and a suggestion of internal hemorrhoids.  She had medium sized internal hemorrhoids on colonoscopy in 2017, also two subCM polyps, one was a TA.  Probably doesn't need recall surveillance examination until 2024 per newest guidelines.  I think she's had minor bleeding from internal hemorrhoids.  Her abd pains are probably due to her worse than usual constipation. I explained in detail that she needs to better hydrate and start a powder fiber supplement on a daily basis.   She will be OK for d/c tomorrow if no further rectal bleeding and I am going to start hemorrhoidal suppository tonight to be placed BID for the next week.   Owens Loffler, MD [facility] Pager [phone]'

## 2019-08-21 NOTE — H&P (Addendum)
History and Physical    Kellie Curtis [id number] DOB: 1970-05-05 DOA: 08/21/2019  PCP: Biagio Borg, MD  Patient coming from: Home  I have personally briefly reviewed patient's old medical records in [place]  Chief Complaint: Rectal bleeding  HPI: Kellie Curtis is a 49 y.o. female with medical history significant of ADHD, anxiety and depression presented to ED with a complaint of bright red blood per rectum.  According to patient, rectal bleeding started last night.  She has had total of 5 episodes so far.  Initially she saw some stool with the blood as well as some blood clots and then recent episodes have been only blood.  She now has some generalized abdominal pain as well as nausea and had 1 episode of vomiting in the ED while she was in the CT scan.  She has no other complaints such as chest pain, shortness of breath, headache, dizziness, any problem with urination.  No recent sick contact or any recent travel.  No prior history of rectal bleeding.  She had colonoscopy in 2017 and she is not sure what the results of that were.  ED Course: Upon arrival to ED, she was mainly hemodynamically stable except some elevated diastolic blood pressure.  CBC showed normal hemoglobin.  CMP was within normal range.  Tested positive for FOBT.  Type and screen was done.  CT abdomen showed possible vaginal and rectal prolapse.  ED physician called GI who recommended admission and they will see for possible colonoscopy tomorrow.  Hospital service were consulted to admit the patient.  Review of Systems: As per HPI otherwise negative.    Past Medical History:  Diagnosis Date  . Abdominal pain, left lower quadrant   . ADHD (attention deficit hyperactivity disorder)   . Allergic rhinitis   . Anemia   . Anxiety state, unspecified   . Asthma    Controlled  . Depression   . Dyslexia   . Dysmenorrhea   . Elevated blood pressure reading without diagnosis of hypertension   . GERD  (gastroesophageal reflux disease)   . History of adenomatous polyp of colon    tubular adenoma's 2012;  2017  . Hyperlipidemia   . IBS (irritable bowel syndrome)   . Menorrhagia   . Other convulsions   . Seizure disorder ([place])   . Uterus, adenomyosis     Past Surgical History:  Procedure Laterality Date  . CESAREAN SECTION  x2  last one 02-22-2005   Bilateral Tubal Ligation w/ last one  . COLONOSCOPY  last one 11-27-2015  . LAPAROSCOPIC VAGINAL HYSTERECTOMY WITH SALPINGECTOMY Bilateral 02/01/2016   Procedure: LAPAROSCOPIC ASSISTED VAGINAL HYSTERECTOMY WITH SALPINGECTOMY;  Surgeon: Arvella Nigh, MD;  Location: [facility];  Service: Gynecology;  Laterality: Bilateral;  . TONSILLECTOMY       reports that she has never smoked. She has never used smokeless tobacco. She reports current alcohol use of about 2.0 standard drinks of alcohol per week. She reports that she does not use drugs.  Allergies  Allergen Reactions  . Latex Rash  . Adhesive [Tape] Rash  . Hydrocodone-Homatropine Itching    Family History  Problem Relation Age of Onset  . Stroke Father   . Hypertension Father   . Gout Father   . Hyperlipidemia Father   . Colon cancer Father   . Asthma Mother   . Alcohol abuse Other        grandfather  . Breast cancer Other  grandmother  . Hypertension Other        grandmother  . Asthma Other        grandmother  . Breast cancer Maternal Grandmother   . Esophageal cancer Neg Hx   . Stomach cancer Neg Hx   . Rectal cancer Neg Hx     Prior to Admission medications   Medication Sig Start Date End Date Taking? Authorizing Provider  ACAI BERRY PO Take 1 tablet by mouth daily.   Yes [provider]  amphetamine-dextroamphetamine (ADDERALL) 5 MG tablet Take 1 tablet (5 mg total) by mouth daily as needed. Patient taking differently: Take 5 mg by mouth daily as needed (attention deficit).  08/14/19  Yes Biagio Borg, MD  Cyanocobalamin (VITAMIN B  12 PO) Take 1 tablet by mouth daily.   Yes [provider]  methylphenidate (METADATE CD) 40 MG CR capsule Take 1 capsule (40 mg total) by mouth every morning. Patient taking differently: Take 40 mg by mouth daily as needed (attention deficit).  08/14/19  Yes Biagio Borg, MD  Multiple Vitamins-Minerals (ALIVE WOMENS GUMMY PO) Take 1 tablet by mouth daily.   Yes [provider]  polyethylene glycol (MIRALAX / GLYCOLAX) 17 g packet Take 17 g by mouth daily.   Yes [provider]  valACYclovir (VALTREX) 500 MG tablet Take 1 tablet by mouth twice daily Patient taking differently: Take 500 mg by mouth 2 (two) times daily as needed (cold sores).  08/19/19  Yes Biagio Borg, MD  VITAMIN D PO Take 1 capsule by mouth daily.   Yes [provider]  linaclotide (LINZESS) 72 MCG capsule Take 1 capsule (72 mcg total) by mouth daily as needed. Patient taking differently: Take 72 mcg by mouth daily as needed (IBS).  08/14/19   Biagio Borg, MD  pantoprazole (PROTONIX) 40 MG tablet Take 1 tablet (40 mg total) by mouth daily. 08/14/19   Biagio Borg, MD    Physical Exam: Vitals:   08/21/19 1025 08/21/19 1138 08/21/19 1141 08/21/19 1306  BP: (!) 125/97 (!) 125/101 (!) 135/91 (!) 144/105  Pulse: 77 76  71  Resp: 18 18  20   Temp:      SpO2: 98% 92%  98%    Constitutional: NAD, calm, comfortable Vitals:   08/21/19 1025 08/21/19 1138 08/21/19 1141 08/21/19 1306  BP: (!) 125/97 (!) 125/101 (!) 135/91 (!) 144/105  Pulse: 77 76  71  Resp: 18 18  20   Temp:      SpO2: 98% 92%  98%   Eyes: PERRL, lids and conjunctivae normal ENMT: Mucous membranes are moist. Posterior pharynx clear of any exudate or lesions.Normal dentition.  Neck: normal, supple, no masses, no thyromegaly Respiratory: clear to auscultation bilaterally, no wheezing, no crackles. Normal respiratory effort. No accessory muscle use.  Cardiovascular: Regular rate and rhythm, no murmurs / rubs / gallops. No  extremity edema. 2+ pedal pulses. No carotid bruits.  Abdomen: no tenderness, no masses palpated. No hepatosplenomegaly. Bowel sounds positive.  Musculoskeletal: no clubbing / cyanosis. No joint deformity upper and lower extremities. Good ROM, no contractures. Normal muscle tone.  Skin: no rashes, lesions, ulcers. No induration Neurologic: CN 2-12 grossly intact. Sensation intact, DTR normal. Strength 5/5 in all 4.  Psychiatric: Normal judgment and insight. Alert and oriented x 3. Normal mood.    Labs on Admission: I have personally reviewed following labs and imaging studies  CBC: Recent Labs  Lab 08/21/19 1005  WBC 5.1  HGB  13.0  HCT 39.2  MCV 94.7  PLT [id number]   Basic Metabolic Panel: Recent Labs  Lab 08/21/19 1005  NA 139  K 3.8  CL 105  CO2 26  GLUCOSE 100*  BUN 11  CREATININE 0.76  CALCIUM 8.8*   GFR: Estimated Creatinine Clearance: 91.5 mL/min (by C-G formula based on SCr of 0.76 mg/dL). Liver Function Tests: Recent Labs  Lab 08/21/19 1005  AST 14*  ALT 12  ALKPHOS 30*  BILITOT 0.5  PROT 7.3  ALBUMIN 4.2   No results for input(s): LIPASE, AMYLASE in the last 168 hours. No results for input(s): AMMONIA in the last 168 hours. Coagulation Profile: No results for input(s): INR, PROTIME in the last 168 hours. Cardiac Enzymes: No results for input(s): CKTOTAL, CKMB, CKMBINDEX, TROPONINI in the last 168 hours. BNP (last 3 results) No results for input(s): PROBNP in the last 8760 hours. HbA1C: No results for input(s): HGBA1C in the last 72 hours. CBG: No results for input(s): GLUCAP in the last 168 hours. Lipid Profile: No results for input(s): CHOL, HDL, LDLCALC, TRIG, CHOLHDL, LDLDIRECT in the last 72 hours. Thyroid Function Tests: No results for input(s): TSH, T4TOTAL, FREET4, T3FREE, THYROIDAB in the last 72 hours. Anemia Panel: No results for input(s): VITAMINB12, FOLATE, FERRITIN, TIBC, IRON, RETICCTPCT in the last 72 hours. Urine analysis:      Component Value Date/Time   COLORURINE YELLOW 08/14/2019 [place] 08/14/2019 1134   LABSPEC 1.025 08/14/2019 1134   PHURINE 6.5 08/14/2019 1134   GLUCOSEU NEGATIVE 08/14/2019 1134   HGBUR NEGATIVE 08/14/2019 [place] 08/14/2019 1134   BILIRUBINUR neg 07/04/2014 1102   KETONESUR NEGATIVE 08/14/2019 1134   PROTEINUR NEGATIVE 01/20/2016 1730   UROBILINOGEN 0.2 08/14/2019 1134   NITRITE NEGATIVE 08/14/2019 1134   LEUKOCYTESUR NEGATIVE 08/14/2019 1134    Radiological Exams on Admission: CT ABDOMEN PELVIS W CONTRAST  Result Date: 08/21/2019 CLINICAL DATA:  Lower abdominal pain with rectal bleeding EXAM: CT ABDOMEN AND PELVIS WITH CONTRAST TECHNIQUE: Multidetector CT imaging of the abdomen and pelvis was performed using the standard protocol following bolus administration of intravenous contrast. CONTRAST:  142mL OMNIPAQUE IOHEXOL 300 MG/ML  SOLN COMPARISON:  July 08, 2014 FINDINGS: Lower chest: There is bibasilar atelectatic change. Lung bases otherwise are clear. There is a small hiatal hernia. Hepatobiliary: No focal liver lesions are evident. Gallbladder wall is not appreciably thickened. There is no biliary duct dilatation. Pancreas: No pancreatic mass or inflammatory focus. Spleen: No splenic lesions are evident. Adrenals/Urinary Tract: Adrenals bilaterally appear normal. There is a cyst in the upper pole of the right kidney measuring 8 x 8 mm. There is no evident renal hydronephrosis on either side. There is no evident renal or ureteral calculus on either side. Urinary bladder is midline with wall thickness within normal limits. Stomach/Bowel: There is no appreciable diverticular disease. There is no evident bowel wall or mesenteric thickening. There is moderate stool in the colon. The terminal ileum appears normal. No evident bowel obstruction. No free air or portal venous air. There is an apparent degree of rectal prolapse. Vascular/Lymphatic: No abdominal  aortic aneurysm. No arterial vascular lesions are evident. Major venous structures appear patent. There is no evident adenopathy in the abdomen or pelvis. Reproductive: Uterus is absent. There is an apparent degree of vaginal prolapse. No pelvic mass evident. Other: Appendix appears normal. No abscess in the abdomen or pelvis. There is minimal ascitic fluid in the dependent portion of the pelvis. There  is a small umbilical hernia containing only fat. Musculoskeletal: No blastic or lytic bone lesions. No intramuscular lesions evident. IMPRESSION: 1.  There is a degree of rectal and vaginal prolapse. 2. No bowel wall thickening or bowel obstruction. No abscess in the abdomen or pelvis. Appendix appears normal. 3. Rather minimal ascites in the dependent portion of the pelvis, a finding of uncertain significance. 4. Small hiatal hernia. Small umbilical hernia containing fat but no bowel. 5. No renal or ureteral calculus. No hydronephrosis. Urinary bladder wall thickness within normal limits. 6.  Uterus absent. Comment: If rectal bleeding persists, direct visualization of the colon may well be warranted.11 Electronically Signed   By: Lowella Grip III M.D.   On: 08/21/2019 12:00    EKG: Not done  Assessment/Plan Active Problems:   Anxiety   Attention deficit hyperactivity disorder (ADHD)   Rectal bleeding   Rectal prolapse    Bright red blood per rectum: No history of such symptoms in the past.  No history of colon Pathology in the past.  Recent colonoscopy in 2017.  Will admit to MedSurg floor.  Start on clear liquid diet and n.p.o. from midnight.  GI to see patient.  Repeat CBC at 8 PM today.  ADHD: Resume home medications.  Elevated blood pressure: No history of hypertension.  Slightly elevated diastolic blood pressure.  Monitor with as needed hydralazine.  Possible rectal and vaginal prolapse: Seen this on the CT scan.  Patient denies feeling any thing.  Per EDP, they did not see any prolapse.   Patient declined vaginal and rectal exam with me.  DVT prophylaxis: SCD.  Avoiding heparin products due to lower GI bleed Code Status: Full code Family Communication: None present at bedside.  Plan of care discussed with patient in length and he verbalized understanding and agreed with it. Disposition Plan: Discharge in 1 to 2 days Consults called: GI Admission status: Observation   Status is: Observation  The patient remains OBS appropriate and will d/c before 2 midnights.  Dispo: The patient is from: Home              Anticipated d/c is to: Home              Anticipated d/c date is: 1 day              Patient currently is not medically stable to d/c.       Darliss Cheney MD [facility]  08/21/2019, 1:10 PM  To contact the attending provider between 7A-7P or the covering provider during after hours 7P-7A, please log into the web site [email or web address]

## 2019-08-21 NOTE — ED Triage Notes (Signed)
Pt reports that since last night had issues with rectal bleeding. Having clots. Has one wrapped up with her. Doesn't take blood thinners. Reports abd pains that comes in waves. Had polyps seen in coloscopy before, unsure about having hemorrhoids.

## 2019-08-21 NOTE — ED Notes (Signed)
Transport called.

## 2019-08-21 NOTE — ED Provider Notes (Signed)
[facility] DEPT Provider Note   CSN: [id number] Arrival date & time: 08/21/19  [id number]     History Chief Complaint  Patient presents with  . Rectal Bleeding    Kellie Curtis is a 49 y.o. female.  Kellie Curtis is a 49 y.o. female with a history of seizure, IBS, colon polyp, GERD, depression, anxiety, asthma, who presents to the emergency department for evaluation of rectal bleeding.  Patient reports that yesterday she is not feeling well having some generalized abdominal pain with nausea and diarrhea, and when she got home from work she had a loose bowel movement with bright red blood and some clots noted in the toilet bowl.  She reports prior to this episode she had never noted blood in her spleen before.  She states she has been dealing with IBS and commonly has some left upper quadrant pain associated with this.  But with episode of bleeding patient began having lower abdominal pain described as a cramping and pressure.  Pain does not localize to 1 side.  She denies any associated fevers or chills.  No nausea or vomiting, no hematemesis.  She reports through the night and this morning she had 3 additional episodes of rectal bleeding, all with bright red blood and some small blood clots.  She states the last episode she had there was no stool at all that she passed, only blood.  She denies straining to have these bowel movements, reports that she does intermittent constipation and diarrhea related to her IBS.  Had previous colonoscopy in 2016 with Dr. Silverio Decamp that showed some polyps, but does not regularly follow with GI.  Patient is not on any blood thinners.  Denies history of hemorrhoids.  Denies heavy alcohol use or NSAID use.  No other aggravating or alleviating factors.        Past Medical History:  Diagnosis Date  . Abdominal pain, left lower quadrant   . ADHD (attention deficit hyperactivity disorder)   . Allergic rhinitis   . Anemia   . Anxiety  state, unspecified   . Asthma    Controlled  . Depression   . Dyslexia   . Dysmenorrhea   . Elevated blood pressure reading without diagnosis of hypertension   . GERD (gastroesophageal reflux disease)   . History of adenomatous polyp of colon    tubular adenoma's 2012;  2017  . Hyperlipidemia   . IBS (irritable bowel syndrome)   . Menorrhagia   . Other convulsions   . Seizure disorder ([place])   . Uterus, adenomyosis     Patient Active Problem List   Diagnosis Date Noted  . Rectal bleeding 08/21/2019  . Rectal prolapse 08/21/2019  . Daytime somnolence 08/26/2017  . Acute upper respiratory infection 03/16/2017  . Urinary incontinence 03/16/2017  . Influenza 06/24/2016  . Cough 04/13/2016  . Lymphadenitis 03/01/2016  . S/P laparoscopic assisted vaginal hysterectomy (LAVH) 02/01/2016  . Perimenopause 06/06/2014  . Thickened endometrium 06/06/2014  . Low grade squamous intraepithelial lesion (LGSIL) on Papanicolaou smear of cervix 05/25/2014  . Hemoptysis 05/10/2013  . Chest pain 01/11/2013  . Anemia, unspecified 10/04/2012  . Allergic rhinitis 03/10/2012  . Irregular menses 05/30/2011  . Irritable bowel syndrome with constipation 05/30/2011  . Encounter for well adult exam with abnormal findings 05/28/2011  . GERD 05/25/2010  . Diaphragmatic hernia 05/21/2010  . COLONIC POLYPS, HX OF 05/21/2010  . Constipation 04/27/2010  . Wheezing 12/16/2008  . Anxiety 03/14/2008  . Depression 03/14/2008  . Attention  deficit hyperactivity disorder (ADHD) 03/14/2008  . SEIZURE DISORDER 02/13/2008  . HYPERLIPIDEMIA 02/07/2008  . BACK PAIN 02/07/2008    Past Surgical History:  Procedure Laterality Date  . CESAREAN SECTION  x2  last one 02-22-2005   Bilateral Tubal Ligation w/ last one  . COLONOSCOPY  last one 11-27-2015  . LAPAROSCOPIC VAGINAL HYSTERECTOMY WITH SALPINGECTOMY Bilateral 02/01/2016   Procedure: LAPAROSCOPIC ASSISTED VAGINAL HYSTERECTOMY WITH SALPINGECTOMY;  Surgeon:  Arvella Nigh, MD;  Location: [facility];  Service: Gynecology;  Laterality: Bilateral;  . TONSILLECTOMY       OB History    Gravida  3   Para  3   Term      Preterm      AB      Living  3     SAB      TAB      Ectopic      Multiple      Live Births              Family History  Problem Relation Age of Onset  . Stroke Father   . Hypertension Father   . Gout Father   . Hyperlipidemia Father   . Colon cancer Father   . Asthma Mother   . Alcohol abuse Other        grandfather  . Breast cancer Other        grandmother  . Hypertension Other        grandmother  . Asthma Other        grandmother  . Breast cancer Maternal Grandmother   . Esophageal cancer Neg Hx   . Stomach cancer Neg Hx   . Rectal cancer Neg Hx     Social History   Tobacco Use  . Smoking status: Never Smoker  . Smokeless tobacco: Never Used  Substance Use Topics  . Alcohol use: Yes    Alcohol/week: 2.0 standard drinks    Types: 2 Cans of beer per week  . Drug use: No    Home Medications Prior to Admission medications   Medication Sig Start Date End Date Taking? Authorizing Provider  ACAI BERRY PO Take 1 tablet by mouth daily.   Yes [provider]  amphetamine-dextroamphetamine (ADDERALL) 5 MG tablet Take 1 tablet (5 mg total) by mouth daily as needed. Patient taking differently: Take 5 mg by mouth daily as needed (attention deficit).  08/14/19  Yes Biagio Borg, MD  Cyanocobalamin (VITAMIN B 12 PO) Take 1 tablet by mouth daily.   Yes [provider]  methylphenidate (METADATE CD) 40 MG CR capsule Take 1 capsule (40 mg total) by mouth every morning. Patient taking differently: Take 40 mg by mouth daily as needed (attention deficit).  08/14/19  Yes Biagio Borg, MD  Multiple Vitamins-Minerals (ALIVE WOMENS GUMMY PO) Take 1 tablet by mouth daily.   Yes [provider]  polyethylene glycol (MIRALAX / GLYCOLAX) 17 g packet Take 17 g by mouth  daily.   Yes [provider]  valACYclovir (VALTREX) 500 MG tablet Take 1 tablet by mouth twice daily Patient taking differently: Take 500 mg by mouth 2 (two) times daily as needed (cold sores).  08/19/19  Yes Biagio Borg, MD  VITAMIN D PO Take 1 capsule by mouth daily.   Yes [provider]  linaclotide (LINZESS) 72 MCG capsule Take 1 capsule (72 mcg total) by mouth daily as needed. Patient taking differently: Take 72 mcg by mouth daily as needed (  IBS).  08/14/19   Biagio Borg, MD  pantoprazole (PROTONIX) 40 MG tablet Take 1 tablet (40 mg total) by mouth daily. 08/14/19   Biagio Borg, MD    Allergies    Latex, Adhesive [tape], and Hydrocodone-homatropine  Review of Systems   Review of Systems  Constitutional: Negative for chills, fatigue and fever.  HENT: Negative.   Respiratory: Negative for cough and shortness of breath.   Cardiovascular: Negative for chest pain.  Gastrointestinal: Positive for abdominal pain, blood in stool, diarrhea and nausea. Negative for vomiting.  Genitourinary: Negative for dysuria and frequency.  Musculoskeletal: Negative for arthralgias and myalgias.  Skin: Negative for color change and rash.  Neurological: Negative for syncope and light-headedness.  All other systems reviewed and are negative.   Physical Exam Updated Vital Signs BP (!) 118/91   Pulse 80   Temp 98.5 F (36.9 C)   Resp 18   LMP 01/20/2016   SpO2 99%   Physical Exam Vitals and nursing note reviewed.  Constitutional:      General: She is not in acute distress.    Appearance: Normal appearance. She is well-developed and normal weight. She is not ill-appearing or diaphoretic.     Comments: Patient appears anxious but is in no acute distress  HENT:     Head: Normocephalic and atraumatic.     Mouth/Throat:     Mouth: Mucous membranes are moist.     Pharynx: Oropharynx is clear.  Eyes:     General:        Right eye: No discharge.        Left eye: No  discharge.     Conjunctiva/sclera: Conjunctivae normal.  Cardiovascular:     Rate and Rhythm: Normal rate and regular rhythm.     Pulses: Normal pulses.     Heart sounds: Normal heart sounds. No murmur. No gallop.   Pulmonary:     Effort: Pulmonary effort is normal. No respiratory distress.     Breath sounds: Normal breath sounds. No wheezing or rales.     Comments: Respirations equal and unlabored, patient able to speak in full sentences, lungs clear to auscultation bilaterally Abdominal:     General: Bowel sounds are normal. There is no distension.     Palpations: Abdomen is soft. There is no mass.     Tenderness: There is abdominal tenderness. There is no guarding.     Comments: Abdomen is soft, nondistended, bowel sounds present throughout, patient with tenderness across the lower abdomen without guarding, there is also some mild left upper quadrant tenderness.  Genitourinary:    Comments: Chaperone present during rectal exam No stool present in the rectal vault, no bright red blood noted, no external hemorrhoids or fissures noted, no palpable internal hemorrhoids, no pain during rectal exam, normal rectal tone Musculoskeletal:        General: No deformity.     Cervical back: Neck supple.  Skin:    General: Skin is warm and dry.     Capillary Refill: Capillary refill takes less than 2 seconds.  Neurological:     Mental Status: She is alert and oriented to person, place, and time.     Coordination: Coordination normal.     Comments: Speech is clear, able to follow commands Moves extremities without ataxia, coordination intact  Psychiatric:        Mood and Affect: Mood normal.        Behavior: Behavior normal.     ED Results /  Procedures / Treatments   Labs (all labs ordered are listed, but only abnormal results are displayed) Labs Reviewed  COMPREHENSIVE METABOLIC PANEL - Abnormal; Notable for the following components:      Result Value   Glucose, Bld 100 (*)    Calcium  8.8 (*)    AST 14 (*)    Alkaline Phosphatase 30 (*)    All other components within normal limits  POC OCCULT BLOOD, ED - Abnormal; Notable for the following components:   Fecal Occult Bld POSITIVE (*)    All other components within normal limits  SARS CORONAVIRUS 2 BY RT PCR (HOSPITAL ORDER, [facility])  CBC  MAGNESIUM  HIV ANTIBODY (ROUTINE TESTING W REFLEX)  CBC  TSH  TYPE AND SCREEN    EKG None  Radiology CT ABDOMEN PELVIS W CONTRAST  Result Date: 08/21/2019 CLINICAL DATA:  Lower abdominal pain with rectal bleeding EXAM: CT ABDOMEN AND PELVIS WITH CONTRAST TECHNIQUE: Multidetector CT imaging of the abdomen and pelvis was performed using the standard protocol following bolus administration of intravenous contrast. CONTRAST:  111mL OMNIPAQUE IOHEXOL 300 MG/ML  SOLN COMPARISON:  July 08, 2014 FINDINGS: Lower chest: There is bibasilar atelectatic change. Lung bases otherwise are clear. There is a small hiatal hernia. Hepatobiliary: No focal liver lesions are evident. Gallbladder wall is not appreciably thickened. There is no biliary duct dilatation. Pancreas: No pancreatic mass or inflammatory focus. Spleen: No splenic lesions are evident. Adrenals/Urinary Tract: Adrenals bilaterally appear normal. There is a cyst in the upper pole of the right kidney measuring 8 x 8 mm. There is no evident renal hydronephrosis on either side. There is no evident renal or ureteral calculus on either side. Urinary bladder is midline with wall thickness within normal limits. Stomach/Bowel: There is no appreciable diverticular disease. There is no evident bowel wall or mesenteric thickening. There is moderate stool in the colon. The terminal ileum appears normal. No evident bowel obstruction. No free air or portal venous air. There is an apparent degree of rectal prolapse. Vascular/Lymphatic: No abdominal aortic aneurysm. No arterial vascular lesions are evident. Major venous structures  appear patent. There is no evident adenopathy in the abdomen or pelvis. Reproductive: Uterus is absent. There is an apparent degree of vaginal prolapse. No pelvic mass evident. Other: Appendix appears normal. No abscess in the abdomen or pelvis. There is minimal ascitic fluid in the dependent portion of the pelvis. There is a small umbilical hernia containing only fat. Musculoskeletal: No blastic or lytic bone lesions. No intramuscular lesions evident. IMPRESSION: 1.  There is a degree of rectal and vaginal prolapse. 2. No bowel wall thickening or bowel obstruction. No abscess in the abdomen or pelvis. Appendix appears normal. 3. Rather minimal ascites in the dependent portion of the pelvis, a finding of uncertain significance. 4. Small hiatal hernia. Small umbilical hernia containing fat but no bowel. 5. No renal or ureteral calculus. No hydronephrosis. Urinary bladder wall thickness within normal limits. 6.  Uterus absent. Comment: If rectal bleeding persists, direct visualization of the colon may well be warranted.11 Electronically Signed   By: Lowella Grip III M.D.   On: 08/21/2019 12:00    Procedures Procedures (including critical care time)  Medications Ordered in ED Medications  sodium chloride (PF) 0.9 % injection (has no administration in time range)  valACYclovir (VALTREX) tablet 500 mg (has no administration in time range)  amphetamine-dextroamphetamine (ADDERALL) tablet 5 mg (has no administration in time range)  methylphenidate (METADATE CD)  CR capsule 40 mg (has no administration in time range)  0.9 %  sodium chloride infusion (has no administration in time range)  acetaminophen (TYLENOL) tablet 650 mg (has no administration in time range)    Or  acetaminophen (TYLENOL) suppository 650 mg (has no administration in time range)  ondansetron (ZOFRAN) tablet 4 mg (has no administration in time range)    Or  ondansetron (ZOFRAN) injection 4 mg (has no administration in time range)    iohexol (OMNIPAQUE) 300 MG/ML solution 100 mL (100 mLs Intravenous Contrast Given 08/21/19 1118)    ED Course  I have reviewed the triage vital signs and the nursing notes.  Pertinent labs & imaging results that were available during my care of the patient were reviewed by me and considered in my medical decision making (see chart for details).  Clinical Course as of Aug 22 49  Fri Aug 23, 2019  0050 Total Protein: 7.3 [KF]    Clinical Course User Index [KF] Janet Berlin   MDM Rules/Calculators/A&P                     49 year old female presents with 4 episodes of rectal bleeding starting last night, reports bright red blood per rectum and passing some small blood clots, no prior history, not on anticoagulation.  Associated with diffuse lower abdominal pain, no fevers, no vomiting or hematemesis.  History of IBS.  On evaluation patient is overall well-appearing with stable vitals.  Will check basic labs, Hemoccult, type and screen, and given that this bleeding is associated with diffuse lower abdominal pain will check CT abdomen pelvis, question whether this could be diverticular bleed, diverticulitis, colitis, no noted hemorrhoids or palpable hemorrhoids on exam and no evidence of fissure, no bleeding on exam today, no gross blood on rectal exam but Hemoccult is positive.  I have independently ordered, reviewed and interpreted all labs and imaging: CBC: Stable hemoglobin of 13, down about one-point from 9 months ago CMP: No significant electrolyte derangements, normal renal liver function Hemoccult: Positive  CT abdomen pelvis suggest some degree of vaginal and rectal prolapse but there is no prolapse noted on exam.  No signs of colitis, no diverticuli making diverticular bleed very unlikely.  No other abnormalities to explain bleeding.  Work-up has been largely reassuring thus far, but the fact that patient has had increasing amounts of blood and 4 episodes of rectal bleeding  since last night is still concerning.  Will discuss with GI.  Case discussed with PA Tye Savoy with [facility], who recommends medicine admission and observation, GI will consult, if she continues to have multiple episodes of bleeding, will likely need colonoscopy.  Case discussed with Dr. Deretha Emory with [facility] who will see and admit the patient.  Final Clinical Impression(s) / ED Diagnoses Final diagnoses:  Lower GI bleed    Rx / DC Orders ED Discharge Orders    None       Janet Berlin 08/23/19 [id number]    Gareth Morgan, MD 08/24/19 2232

## 2019-08-22 ENCOUNTER — Encounter: Payer: Self-pay | Admitting: Nurse Practitioner

## 2019-08-22 DIAGNOSIS — K922 Gastrointestinal hemorrhage, unspecified: Secondary | ICD-10-CM

## 2019-08-22 DIAGNOSIS — K625 Hemorrhage of anus and rectum: Secondary | ICD-10-CM | POA: Diagnosis not present

## 2019-08-22 LAB — BASIC METABOLIC PANEL
Anion gap: 8 (ref 5–15)
BUN: 7 mg/dL (ref 6–20)
CO2: 24 mmol/L (ref 22–32)
Calcium: 9.2 mg/dL (ref 8.9–10.3)
Chloride: 105 mmol/L (ref 98–111)
Creatinine, Ser: 0.72 mg/dL (ref 0.44–1.00)
GFR calc Af Amer: >60 mL/min (ref >60)
GFR calc non Af Amer: >60 mL/min (ref >60)
Glucose, Bld: 96 mg/dL (ref 70–99)
Potassium: 4.3 mmol/L (ref 3.5–5.1)
Sodium: 137 mmol/L (ref 135–145)

## 2019-08-22 LAB — CBC
HCT: 41 % (ref 36.0–46.0)
Hemoglobin: 13.2 g/dL (ref 12.0–15.0)
MCH: 30.5 pg (ref 26.0–34.0)
MCHC: 32.2 g/dL (ref 30.0–36.0)
MCV: 94.7 fL (ref 80.0–100.0)
Platelets: 308 10*3/uL (ref 150–400)
RBC: 4.33 MIL/uL (ref 3.87–5.11)
RDW: 12.6 % (ref 11.5–15.5)
WBC: 4 10*3/uL (ref 4.0–10.5)
nRBC: 0 % (ref 0.0–0.2)

## 2019-08-22 MED ORDER — HYDROCORTISONE (PERIANAL) 2.5 % EX CREA
TOPICAL_CREAM | Freq: Two times a day (BID) | CUTANEOUS | Status: DC
  Filled 2019-08-22: qty 28.35

## 2019-08-22 MED ORDER — HYDROCORTISONE (PERIANAL) 2.5 % EX CREA: TOPICAL_CREAM | Freq: Two times a day (BID) | CUTANEOUS | 0 refills | Status: AC

## 2019-08-22 MED ORDER — CITRUCEL PO POWD: ORAL | Status: DC

## 2019-08-22 NOTE — Discharge Summary (Signed)
DISCHARGE SUMMARY  Kellie Curtis  MR#: [id number]  DOB:Apr 04, 1971  Date of Admission: 08/21/2019 Date of Discharge: 08/22/2019  Attending Physician:Briston Lax Hennie Duos, MD  Patient's TE:[id number], Hunt Oris, MD  Consults: [facility]  Disposition: D/C home   Follow-up Appts: Follow-up Information    Willia Craze, NP Follow up on 09/13/2019.   Specialty: Gastroenterology Why: at 10 am.   Contact information: [place] [address] [phone]        Willia Craze, NP .   Specialty: Gastroenterology Contact information: [address]  [id number] [place] [address] [phone]           Discharge Diagnoses: BRBPR History of adenomatous colon polyps ADHD  Initial presentation: 49 year old with a history of ADHD, IBS with chronic constipation, small hiatal hernia, prior adenomatous colon polyps, and anxiety/depression who presented to the ED with BRBPR of acute onset with a total of 5 episodes reported.  This was associated with generalized abdominal pain and nausea.  In the ED she was hemodynamically stable with a normal hemoglobin on CBC.  CT abdomen raised a question of vaginal/rectal prolapse.  Hospital Course:  BRBPR No prior history of same -most recent prior colonoscopy 2017 -GI evaluated and felt findings were most consistent with internal hemorrhoids -only 1 bowel movement with some blood in it since admission -hemoglobin stable -Citrucel 2 tablespoons daily in 8 ounces of water suggested -increased water intake suggested -hydrocortisone cream per rectum for 10 days -to follow-up with Dr. Owens Loffler 09/13/2019 at 10 AM  History of adenomatous colon polyps Due for 5-year surveillance colonoscopy September 2020 -father with history of colorectal cancer diagnosed over the age of 50 -GI following  ADHD Continue usual home medication regimen  Elevated blood pressure Likely situational - BP normalized prior to d/c home    Possible rectal/vaginal prolapse Suggested via CT but ED physician denied evidence of this on exam and patient denied repeat exam  Allergies as of 08/22/2019      Reactions   Latex Rash   Adhesive [tape] Rash   Hydrocodone-homatropine Itching      Medication List    TAKE these medications   ACAI BERRY PO Take 1 tablet by mouth daily.   ALIVE WOMENS GUMMY PO Take 1 tablet by mouth daily.   amphetamine-dextroamphetamine 5 MG tablet Commonly known as: Adderall Take 1 tablet (5 mg total) by mouth daily as needed. What changed: reasons to take this   Citrucel oral powder Generic drug: methylcellulose Take 2 tablespoons in 8oz of water every day.   hydrocortisone 2.5 % rectal cream Commonly known as: ANUSOL-HC Place rectally 2 (two) times daily for 10 days.   linaclotide 72 MCG capsule Commonly known as: Linzess Take 1 capsule (72 mcg total) by mouth daily as needed. What changed: reasons to take this   methylphenidate 40 MG CR capsule Commonly known as: METADATE CD Take 1 capsule (40 mg total) by mouth every morning. What changed:   when to take this  reasons to take this   pantoprazole 40 MG tablet Commonly known as: PROTONIX Take 1 tablet (40 mg total) by mouth daily.   polyethylene glycol 17 g packet Commonly known as: MIRALAX / GLYCOLAX Take 17 g by mouth daily.   valACYclovir 500 MG tablet Commonly known as: VALTREX Take 1 tablet by mouth twice daily What changed:   when to take this  reasons to take this   VITAMIN B 12 PO Take 1 tablet  by mouth daily.   VITAMIN D PO Take 1 capsule by mouth daily.       Day of Discharge BP 117/67 (BP Location: Left Arm)   Pulse 62   Temp 98.1 F (36.7 C) (Oral)   Resp 16   LMP 01/20/2016   SpO2 97%   Physical Exam: General: No acute respiratory distress Lungs: Clear to auscultation bilaterally without wheezes or crackles Cardiovascular: Regular rate and rhythm without murmur gallop or rub normal  S1 and S2 Abdomen: Nontender, nondistended, soft, bowel sounds positive, no rebound, no ascites, no appreciable mass Extremities: No significant cyanosis, clubbing, or edema bilateral lower extremities  Basic Metabolic Panel: Recent Labs  Lab 08/21/19 1005 08/22/19 0536  NA 139 137  K 3.8 4.3  CL 105 105  CO2 26 24  GLUCOSE 100* 96  BUN 11 7  CREATININE 0.76 0.72  CALCIUM 8.8* 9.2  MG 2.0  --     Liver Function Tests: Recent Labs  Lab 08/21/19 1005  AST 14*  ALT 12  ALKPHOS 30*  BILITOT 0.5  PROT 7.3  ALBUMIN 4.2   CBC: Recent Labs  Lab 08/21/19 1005 08/21/19 1956 08/22/19 0536  WBC 5.1 5.9 4.0  HGB 13.0 12.6 13.2  HCT 39.2 39.1 41.0  MCV 94.7 94.9 94.7  PLT 332 327 308    Recent Results (from the past 240 hour(s))  SARS Coronavirus 2 by RT PCR (hospital order, performed in [facility] hospital lab) Nasopharyngeal Nasopharyngeal Swab     Status: None   Collection Time: 08/21/19  2:09 PM   Specimen: Nasopharyngeal Swab  Result Value Ref Range Status   SARS Coronavirus 2 NEGATIVE NEGATIVE Final    Comment: (NOTE) SARS-CoV-2 target nucleic acids are NOT DETECTED. The SARS-CoV-2 RNA is generally detectable in upper and lower respiratory specimens during the acute phase of infection. The lowest concentration of SARS-CoV-2 viral copies this assay can detect is 250 copies / mL. A negative result does not preclude SARS-CoV-2 infection and should not be used as the sole basis for treatment or other patient management decisions.  A negative result may occur with improper specimen collection / handling, submission of specimen other than nasopharyngeal swab, presence of viral mutation(s) within the areas targeted by this assay, and inadequate number of viral copies (<250 copies / mL). A negative result must be combined with clinical observations, patient history, and epidemiological information. Fact Sheet for Patients:    StrictlyIdeas.no Fact Sheet for Healthcare Providers: BankingDealers.co.za This test is not yet approved or cleared  by the [place] FDA and has been authorized for detection and/or diagnosis of SARS-CoV-2 by FDA under an Emergency Use Authorization (EUA).  This EUA will remain in effect (meaning this test can be used) for the duration of the COVID-19 declaration under Section 564(b)(1) of the Act, 21 U.S.C. section 360bbb-3(b)(1), unless the authorization is terminated or revoked sooner. Performed at [facility], [place] [address], [place], [place] [address]      Time spent in discharge (includes decision making & examination of pt): 30 minutes  08/22/2019, 2:43 PM   Cherene Altes, MD [facility] Office  [phone]

## 2019-08-22 NOTE — Progress Notes (Signed)
   08/22/19   To Whom it may concern,  Kellie Curtis was admitted to [facility] on 08/21/2019 and remained under my care in the hospital through 08/22/2019. She has been advised that she should not return to work until 08/26/19, at which time she will be cleared to resume all of her usual responsibilities.  Sincerely,  Cherene Altes, MD [facility] Office  [phone]

## 2019-08-22 NOTE — Discharge Instructions (Signed)
Lower Gastrointestinal Bleeding  Lower gastrointestinal (GI) bleeding is the result of bleeding from the colon, rectum, or anal area. The colon is the last part of the digestive tract, where stool, also called feces, is formed. If you have lower GI bleeding, you may see blood in or on your stool. It may be bright red. Lower GI bleeding often stops without treatment. Continued or heavy bleeding needs emergency treatment at the hospital. What are the causes? Lower GI bleeding may be caused by:  A condition that causes pouches to form in the colon over time (diverticulosis).  Swelling and irritation (inflammation) in areas with diverticulosis (diverticulitis).  Inflammation of the colon (inflammatory bowel disease).  Swollen veins in the rectum (hemorrhoids).  Painful tears in the anus (anal fissures), often caused by passing hard stools.  Cancer of the colon or rectum.  Noncancerous growths (polyps) of the colon or rectum.  A bleeding disorder that impairs the formation of blood clots and causes easy bleeding (coagulopathy).  An abnormal weakening of a blood vessel where an artery and a vein come together (arteriovenous malformation). What increases the risk? You are more likely to develop this condition if:  You are older than 51 years of age.  You take aspirin or NSAIDs on a regular basis.  You take anticoagulant or antiplatelet drugs.  You have a history of high-dose X-ray treatment (radiation therapy) of the colon.  You recently had a colon polyp removed. What are the signs or symptoms? Symptoms of this condition include:  Bright red blood or blood clots coming from your rectum.  Bloody stools.  Black or maroon-colored stools.  Pain or cramping in the abdomen.  Weakness or dizziness.  Racing heartbeat. How is this diagnosed? This condition may be diagnosed based on:  Your symptoms and medical history.  A physical exam. During the exam, your health care  provider will check for signs of blood loss, such as low blood pressure and a rapid pulse.  Tests, such as: ? Flexible sigmoidoscopy. In this procedure, a flexible tube with a camera on the end is used to examine your anus and the first part of your colon to look for the source of bleeding. ? Colonoscopy. This is similar to a flexible sigmoidoscopy, but the camera can extend all the way to the uppermost part of your colon. ? Blood tests to measure your red blood cell count and to check for coagulopathy. ? An imaging study of your colon to look for a bleeding site. In some cases, you may have X-rays taken after a dye or radioactive substance is injected into your bloodstream (angiogram). How is this treated? Treatment for this condition depends on the cause of the bleeding. Heavy or persistent bleeding is treated at the hospital. Treatment may include:  Getting fluids through an IV tube inserted into one of your veins.  Getting blood through an IV tube (blood transfusion).  Stopping bleeding through high-heat coagulation, injections of certain medicines, or applying surgical clips. This can all be done during a colonoscopy.  Having a procedure that involves first doing an angiogram and then blocking blood flow to the bleeding site (embolization).  Stopping some of your regular medicines for a certain amount of time.  Having surgery to remove part of the colon. This may be needed if bleeding is severe and does not respond to other treatment. Follow these instructions at home:  Take over-the-counter and prescription medicines only as told by your health care provider. You may need to  avoid aspirin, NSAIDs, or other medicines that increase bleeding.  Eat foods that are high in fiber. This will help keep your stools soft. These foods include whole grains, legumes, fruits, and vegetables. Eating 1-3 prunes each day works well for many people.  Drink enough fluid to keep your urine clear or pale  yellow.  Keep all follow-up visits as told by your health care provider. This is important. Contact a health care provider if:  Your symptoms do not improve. Get help right away if:  Your bleeding increases.  You feel light-headed or you faint.  You feel weak.  You have severe cramps in your back or abdomen.  You pass large blood clots in your stool.  Your symptoms get worse. This information is not intended to replace advice given to you by your health care provider. Make sure you discuss any questions you have with your health care provider. Document Revised: 07/13/2018 Document Reviewed: 08/06/2015 Elsevier Patient Education  [place].   Hemorrhoids Hemorrhoids are swollen veins that may develop:  In the butt (rectum). These are called internal hemorrhoids.  Around the opening of the butt (anus). These are called external hemorrhoids. Hemorrhoids can cause pain, itching, or bleeding. Most of the time, they do not cause serious problems. They usually get better with diet changes, lifestyle changes, and other home treatments. What are the causes? This condition may be caused by:  Having trouble pooping (constipation).  Pushing hard (straining) to poop.  Watery poop (diarrhea).  Pregnancy.  Being very overweight (obese).  Sitting for long periods of time.  Heavy lifting or other activity that causes you to strain.  Anal sex.  Riding a bike for a long period of time. What are the signs or symptoms? Symptoms of this condition include:  Pain.  Itching or soreness in the butt.  Bleeding from the butt.  Leaking poop.  Swelling in the area.  One or more lumps around the opening of your butt. How is this diagnosed? A doctor can often diagnose this condition by looking at the affected area. The doctor may also:  Do an exam that involves feeling the area with a gloved hand (digital rectal exam).  Examine the area inside your butt using a small  tube (anoscope).  Order blood tests. This may be done if you have lost a lot of blood.  Have you get a test that involves looking inside the colon using a flexible tube with a camera on the end (sigmoidoscopy or colonoscopy). How is this treated? This condition can usually be treated at home. Your doctor may tell you to change what you eat, make lifestyle changes, or try home treatments. If these do not help, procedures can be done to remove the hemorrhoids or make them smaller. These may involve:  Placing rubber bands at the base of the hemorrhoids to cut off their blood supply.  Injecting medicine into the hemorrhoids to shrink them.  Shining a type of light energy onto the hemorrhoids to cause them to fall off.  Doing surgery to remove the hemorrhoids or cut off their blood supply. Follow these instructions at home: Eating and drinking   Eat foods that have a lot of fiber in them. These include whole grains, beans, nuts, fruits, and vegetables.  Ask your doctor about taking products that have added fiber (fibersupplements).  Reduce the amount of fat in your diet. You can do this by: ? Eating low-fat dairy products. ? Eating less red meat. ? Avoiding  processed foods.  Drink enough fluid to keep your pee (urine) pale yellow. Managing pain and swelling   Take a warm-water bath (sitz bath) for 20 minutes to ease pain. Do this 3-4 times a day. You may do this in a bathtub or using a portable sitz bath that fits over the toilet.  If told, put ice on the painful area. It may be helpful to use ice between your warm baths. ? Put ice in a plastic bag. ? Place a towel between your skin and the bag. ? Leave the ice on for 20 minutes, 2-3 times a day. General instructions  Take over-the-counter and prescription medicines only as told by your doctor. ? Medicated creams and medicines may be used as told.  Exercise often. Ask your doctor how much and what kind of exercise is best for  you.  Go to the bathroom when you have the urge to poop. Do not wait.  Avoid pushing too hard when you poop.  Keep your butt dry and clean. Use wet toilet paper or moist towelettes after pooping.  Do not sit on the toilet for a long time.  Keep all follow-up visits as told by your doctor. This is important. Contact a doctor if you:  Have pain and swelling that do not get better with treatment or medicine.  Have trouble pooping.  Cannot poop.  Have pain or swelling outside the area of the hemorrhoids. Get help right away if you have:  Bleeding that will not stop. Summary  Hemorrhoids are swollen veins in the butt or around the opening of the butt.  They can cause pain, itching, or bleeding.  Eat foods that have a lot of fiber in them. These include whole grains, beans, nuts, fruits, and vegetables.  Take a warm-water bath (sitz bath) for 20 minutes to ease pain. Do this 3-4 times a day. This information is not intended to replace advice given to you by your health care provider. Make sure you discuss any questions you have with your health care provider. Document Revised: 03/29/2018 Document Reviewed: 08/10/2017 Elsevier Patient Education  [place].

## 2019-08-22 NOTE — Progress Notes (Addendum)
Progress Note  CC:        Rectal bleeding   ASSESSMENT AND PLAN:   49 yo female with pmh significant for GERD, small hiatal hernia, adenomatous colon polyps, IBS / chronic constipation, ADHD, dyslexia, hysterectomy  # Rectal bleeding --in setting of chronic constipation and suggestion of internal hemorrhoids on DRE.  --Only 1 bowel movement with blood since we saw her in ED yesterday --Hemoglobin stable at 13.2 --Patient is stable for discharge from GI standpoint. --Start Citrucel 2 tablespoons daily in 8 oz water --Drink 64 oz water daily.  --Hydrocortisone cream 2.5 mg PR qam and qhs x 10 days upon discharge --Follow up with me 09/13/19 at 10 am   SUBJECTIVE   Since I saw her in the ED yesterday she has had only 1 bowel movement with blood.  Hungry.    OBJECTIVE:     Vital signs in last 24 hours: Temp:  [97.7 F (36.5 C)-98.6 F (37 C)] 97.8 F (36.6 C) (05/20 0438) Pulse Rate:  [62-77] 70 (05/20 0438) Resp:  [14-20] 14 (05/20 0438) BP: (100-144)/(63-105) 114/79 (05/20 0438) SpO2:  [92 %-100 %] 96 % (05/20 0438)   General:   Alert, in NAD Heart:  Regular rate and rhythm.  No lower extremity edema   Pulm: Normal respiratory effort   Abdomen:  Soft,  nontender, nondistended.  Normal bowel sounds.          Neurologic:  Alert and  oriented,  grossly normal neurologically. Psych:  Pleasant, cooperative.  Normal mood and affect.   Intake/Output from previous day: No intake/output data recorded. Intake/Output this shift: No intake/output data recorded.  Lab Results: Recent Labs    08/21/19 1005 08/21/19 1956 08/22/19 0536  WBC 5.1 5.9 4.0  HGB 13.0 12.6 13.2  HCT 39.2 39.1 41.0  PLT 332 327 308   BMET Recent Labs    08/21/19 1005 08/22/19 0536  NA 139 137  K 3.8 4.3  CL 105 105  CO2 26 24  GLUCOSE 100* 96  BUN 11 7  CREATININE 0.76 0.72  CALCIUM 8.8* 9.2   LFT Recent Labs    08/21/19 1005  PROT 7.3  ALBUMIN 4.2  AST 14*  ALT 12    ALKPHOS 30*  BILITOT 0.5   PT/INR No results for input(s): LABPROT, INR in the last 72 hours. Hepatitis Panel No results for input(s): HEPBSAG, HCVAB, HEPAIGM, HEPBIGM in the last 72 hours.  CT ABDOMEN PELVIS W CONTRAST  Result Date: 08/21/2019 CLINICAL DATA:  Lower abdominal pain with rectal bleeding EXAM: CT ABDOMEN AND PELVIS WITH CONTRAST TECHNIQUE: Multidetector CT imaging of the abdomen and pelvis was performed using the standard protocol following bolus administration of intravenous contrast. CONTRAST:  157mL OMNIPAQUE IOHEXOL 300 MG/ML  SOLN COMPARISON:  July 08, 2014 FINDINGS: Lower chest: There is bibasilar atelectatic change. Lung bases otherwise are clear. There is a small hiatal hernia. Hepatobiliary: No focal liver lesions are evident. Gallbladder wall is not appreciably thickened. There is no biliary duct dilatation. Pancreas: No pancreatic mass or inflammatory focus. Spleen: No splenic lesions are evident. Adrenals/Urinary Tract: Adrenals bilaterally appear normal. There is a cyst in the upper pole of the right kidney measuring 8 x 8 mm. There is no evident renal hydronephrosis on either side. There is no evident renal or ureteral calculus on either side. Urinary bladder is midline with wall thickness within normal limits. Stomach/Bowel: There is no appreciable diverticular disease. There is no evident bowel wall or  mesenteric thickening. There is moderate stool in the colon. The terminal ileum appears normal. No evident bowel obstruction. No free air or portal venous air. There is an apparent degree of rectal prolapse. Vascular/Lymphatic: No abdominal aortic aneurysm. No arterial vascular lesions are evident. Major venous structures appear patent. There is no evident adenopathy in the abdomen or pelvis. Reproductive: Uterus is absent. There is an apparent degree of vaginal prolapse. No pelvic mass evident. Other: Appendix appears normal. No abscess in the abdomen or pelvis. There is  minimal ascitic fluid in the dependent portion of the pelvis. There is a small umbilical hernia containing only fat. Musculoskeletal: No blastic or lytic bone lesions. No intramuscular lesions evident. IMPRESSION: 1.  There is a degree of rectal and vaginal prolapse. 2. No bowel wall thickening or bowel obstruction. No abscess in the abdomen or pelvis. Appendix appears normal. 3. Rather minimal ascites in the dependent portion of the pelvis, a finding of uncertain significance. 4. Small hiatal hernia. Small umbilical hernia containing fat but no bowel. 5. No renal or ureteral calculus. No hydronephrosis. Urinary bladder wall thickness within normal limits. 6.  Uterus absent. Comment: If rectal bleeding persists, direct visualization of the colon may well be warranted.11 Electronically Signed   By: Lowella Grip III M.D.   On: 08/21/2019 12:00     Active Problems:   Anxiety   Attention deficit hyperactivity disorder (ADHD)   Rectal bleeding   Rectal prolapse     LOS: 0 days   Tye Savoy ,NP 08/22/2019, 9:39 AM   ________________________________________________________________________  Velora Heckler GI MD note:  I reviewed the data and agree with the assessment and plan described above.   Owens Loffler, MD [facility] Pager [phone]

## 2019-08-22 NOTE — Plan of Care (Signed)
Pt VS WNL this morning.  No complaints at this time.  Problem: Education: Goal: Ability to identify signs and symptoms of gastrointestinal bleeding will improve Outcome: Progressing   Problem: Bowel/Gastric: Goal: Will show no signs and symptoms of gastrointestinal bleeding Outcome: Progressing   Problem: Fluid Volume: Goal: Will show no signs and symptoms of excessive bleeding Outcome: Progressing   Problem: Clinical Measurements: Goal: Complications related to the disease process, condition or treatment will be avoided or minimized Outcome: Progressing

## 2019-08-22 NOTE — Progress Notes (Signed)
Pt VS WNL.  No complaints today.  Discharge paperwork provided to and review with patient.  PIV discontinued.  Catheter intact and clotting within expected timeframe.  Pt transported by RN via wheelchair to hospital entrance.

## 2019-09-13 ENCOUNTER — Ambulatory Visit: Payer: BC Managed Care – PPO | Admitting: Nurse Practitioner

## 2019-10-16 ENCOUNTER — Ambulatory Visit: Payer: BC Managed Care – PPO | Admitting: Pulmonary Disease

## 2019-10-16 ENCOUNTER — Other Ambulatory Visit: Payer: Self-pay

## 2019-10-16 ENCOUNTER — Encounter: Payer: Self-pay | Admitting: Pulmonary Disease

## 2019-10-16 VITALS — BP 108/64 | HR 86 | Temp 98.2°F | Ht 65.0 in | Wt 210.0 lb

## 2019-10-16 DIAGNOSIS — R0683 Snoring: Secondary | ICD-10-CM

## 2019-10-16 NOTE — Patient Instructions (Signed)
Will arrange for home sleep study Will call to arrange for follow up after sleep study reviewed  

## 2019-10-16 NOTE — Progress Notes (Signed)
[facility]  Chief Complaint  Patient presents with   Follow-up    Snoring is Worse,gasping when wakes up at night,talking    Constitutional:  BP 108/64 (BP Location: Right Arm, Cuff Size: Normal)    Pulse 86    Temp 98.2 F (36.8 C) (Oral)    Ht 5\' 5"  (1.651 m)    Wt 210 lb (95.3 kg)    LMP 01/20/2016    SpO2 97%    BMI 34.95 kg/m   Past Medical History:  ADHD, Allergies, Anemia, Anxiety, Depression, Asthma, Dyslexia, GERD, Colon polyp, HLD, IBS, Seizures  Summary:  Kellie Curtis is a 49 y.o. female with snoring.  She is a Proofreader.  Subjective:  She wasn't able to set up home sleep study previously.  Her sleep has gotten worse.  She snores, and boyfriend says she stops breathing while asleep.  She is sleep during the day in spite of being on stimulant medications for ADHD.  She reports several of her sisters have trouble with daytime sleepiness also.  She goes to bed at 930 pm and wakes up at 530 am.    Physical Exam:   Appearance - well kempt  ENMT - no sinus tenderness, no nasal discharge, no oral exudate, Mallampati 3  Respiratory - no wheeze, or rales  CV - regular rate and rhythm, no murmurs  GI - soft, non tender  Lymph - no adenopathy noted in neck  Ext - no edema  Skin - no rashes  Neuro - normal strength, oriented x 3  Psych - normal mood and affect  Discussion:  She has snoring, sleep disruption, daytime sleepiness, and apnea.  She has history of mood disorder.  I am concerned she could have sleep apnea.  Assessment/Plan:   Snoring with excessive daytime sleepiness. - will need to arrange for a home sleep study  Obesity. - discussed how weight can impact sleep and risk for sleep disordered breathing - discussed options to assist with weight loss: combination of diet modification, cardiovascular and strength training exercises  Cardiovascular risk. - had an extensive discussion regarding the  adverse health consequences related to untreated sleep disordered breathing - specifically discussed the risks for hypertension, coronary artery disease, cardiac dysrhythmias, cerebrovascular disease, and diabetes - lifestyle modification discussed  Safe driving practices. - discussed how sleep disruption can increase risk of accidents, particularly when driving - safe driving practices were discussed  Therapies for obstructive sleep apnea. - if the sleep study shows significant sleep apnea, then various therapies for treatment were reviewed: CPAP, oral appliance, and surgical interventions   A total of  24 minutes spent addressing patient care issues on day of visit.  Follow up:  Patient Instructions  Will arrange for home sleep study Will call to arrange for follow up after sleep study reviewed    Signature:  Chesley Mires, MD [place] Pager: [phone] 10/16/2019, 11:55 AM  Flow Sheet    Sleep tests:    Medications:   Allergies as of 10/16/2019      Reactions   Latex Rash   Adhesive [tape] Rash   Hydrocodone-homatropine Itching      Medication List       Accurate as of October 16, 2019 11:55 AM. If you have any questions, ask your nurse or doctor.        ACAI BERRY PO Take 1 tablet by mouth daily.   ALIVE WOMENS GUMMY PO Take 1 tablet by mouth  daily.   amphetamine-dextroamphetamine 5 MG tablet Commonly known as: Adderall Take 1 tablet (5 mg total) by mouth daily as needed. What changed: reasons to take this   Citrucel oral powder Generic drug: methylcellulose Take 2 tablespoons in 8oz of water every day.   linaclotide 72 MCG capsule Commonly known as: Linzess Take 1 capsule (72 mcg total) by mouth daily as needed. What changed: reasons to take this   methylphenidate 40 MG CR capsule Commonly known as: METADATE CD Take 1 capsule (40 mg total) by mouth every morning. What changed:   when to take this  reasons to take  this   pantoprazole 40 MG tablet Commonly known as: PROTONIX Take 1 tablet (40 mg total) by mouth daily.   polyethylene glycol 17 g packet Commonly known as: MIRALAX / GLYCOLAX Take 17 g by mouth daily.   valACYclovir 500 MG tablet Commonly known as: VALTREX Take 1 tablet by mouth twice daily What changed:   when to take this  reasons to take this   VITAMIN B 12 PO Take 1 tablet by mouth daily.   VITAMIN D PO Take 1 capsule by mouth daily.       Past Surgical History:  She  has a past surgical history that includes Cesarean section (x2  last one 02-22-2005); Tonsillectomy; Colonoscopy (last one 11-27-2015); and Laparoscopic vaginal hysterectomy with salpingectomy (Bilateral, 02/01/2016).  Family History:  Her family history includes Alcohol abuse in an other family member; Asthma in her mother and another family member; Breast cancer in her maternal grandmother and another family member; Colon cancer in her father; Gout in her father; Hyperlipidemia in her father; Hypertension in her father and another family member; Stroke in her father.  Social History:  She  reports that she has never smoked. She has never used smokeless tobacco. She reports current alcohol use of about 2.0 standard drinks of alcohol per week. She reports that she does not use drugs.

## 2019-11-01 ENCOUNTER — Other Ambulatory Visit: Payer: Self-pay

## 2019-11-01 ENCOUNTER — Ambulatory Visit: Payer: BC Managed Care – PPO

## 2019-11-01 DIAGNOSIS — R0683 Snoring: Secondary | ICD-10-CM

## 2019-11-01 DIAGNOSIS — G4733 Obstructive sleep apnea (adult) (pediatric): Secondary | ICD-10-CM | POA: Diagnosis not present

## 2019-11-20 DIAGNOSIS — G4733 Obstructive sleep apnea (adult) (pediatric): Secondary | ICD-10-CM

## 2019-11-21 ENCOUNTER — Telehealth: Payer: Self-pay | Admitting: Pulmonary Disease

## 2019-11-21 NOTE — Telephone Encounter (Signed)
Called spoke with patient  Let them know the recommendations Scheduled with NP to discuss options  Nothing further needed at this time

## 2019-11-21 NOTE — Telephone Encounter (Signed)
HST 11/01/19 >> AHI 13.6, SpO2 low 75%   Please inform her that her sleep study shows mild obstructive sleep apnea.  Please arrange for ROV with me or NP to discuss treatment options.

## 2019-11-27 ENCOUNTER — Ambulatory Visit (INDEPENDENT_AMBULATORY_CARE_PROVIDER_SITE_OTHER): Payer: BC Managed Care – PPO | Admitting: Adult Health

## 2019-11-27 ENCOUNTER — Other Ambulatory Visit: Payer: Self-pay

## 2019-11-27 ENCOUNTER — Encounter: Payer: Self-pay | Admitting: Adult Health

## 2019-11-27 DIAGNOSIS — G4733 Obstructive sleep apnea (adult) (pediatric): Secondary | ICD-10-CM | POA: Diagnosis not present

## 2019-11-27 NOTE — Progress Notes (Signed)
@Patient  ID: Kellie Curtis, female    DOB: [birth year], 49 y.o.   MRN: [id number]  Chief Complaint  Patient presents with  . Follow-up    sleep study     Referring provider: Biagio Borg, MD  HPI: 49 year old female seen for sleep consult October 16, 2019 for snoring, restless sleep and daytime sleepiness found to have mild sleep apnea on home sleep study Medical history significant for ADHD on Adderall and Ritalin . Followed by PCP   TEST/EVENTS :  HST 11/01/19 >> AHI 13.6, SpO2 low 75%  11/27/2019 Follow up : OSA  Patient returns for a 1 month follow-up.  Patient was seen last visit for a sleep consult for daytime sleepiness, snoring and restless sleep.  She was set up for home sleep study that showed that she does in fact have mild obstructive sleep apnea with AHI at 13.6/hour.  SPO2 low 75%.  We discussed her sleep study results went over potential complications of untreated sleep apnea.  We discussed treatment options including weight loss, oral appliance and CPAP.  Patient would like to proceed with CPAP .   She is a truck driver./school bus driver. Advised on dangers of sleepiness and driving . Discussed CPAP compliance with CDL license.    Allergies  Allergen Reactions  . Latex Rash  . Adhesive [Tape] Rash  . Hydrocodone-Homatropine Itching    Immunization History  Administered Date(s) Administered  . Influenza,inj,Quad PF,6+ Mos 03/16/2017  . PFIZER SARS-COV-2 Vaccination 07/06/2019, 07/27/2019    Past Medical History:  Diagnosis Date  . Abdominal pain, left lower quadrant   . ADHD (attention deficit hyperactivity disorder)   . Allergic rhinitis   . Anemia   . Anxiety state, unspecified   . Asthma    Controlled  . Depression   . Dyslexia   . Dysmenorrhea   . Elevated blood pressure reading without diagnosis of hypertension   . GERD (gastroesophageal reflux disease)   . History of adenomatous polyp of colon    tubular adenoma's 2012;  2017  . Hyperlipidemia     . IBS (irritable bowel syndrome)   . Menorrhagia   . Other convulsions   . Seizure disorder ([place])   . Uterus, adenomyosis     Tobacco History: Social History   Tobacco Use  Smoking Status Never Smoker  Smokeless Tobacco Never Used   Counseling given: Not Answered   Outpatient Medications Prior to Visit  Medication Sig Dispense Refill  . amphetamine-dextroamphetamine (ADDERALL) 5 MG tablet Take 1 tablet (5 mg total) by mouth daily as needed. (Patient taking differently: Take 5 mg by mouth daily as needed (attention deficit). ) 30 tablet 0  . Ascorbic Acid (VITAMIN C) 100 MG tablet Take 100 mg by mouth daily.    . methylcellulose (CITRUCEL) oral powder Take 2 tablespoons in 8oz of water every day.    . methylphenidate (METADATE CD) 40 MG CR capsule Take 1 capsule (40 mg total) by mouth every morning. (Patient taking differently: Take 40 mg by mouth daily as needed (attention deficit). ) 30 capsule 0  . Multiple Vitamins-Minerals (ALIVE WOMENS GUMMY PO) Take 1 tablet by mouth daily.    . pantoprazole (PROTONIX) 40 MG tablet Take 1 tablet (40 mg total) by mouth daily. 90 tablet 3  . polyethylene glycol (MIRALAX / GLYCOLAX) 17 g packet Take 17 g by mouth daily.    Marland Kitchen VITAMIN D PO Take 1 capsule by mouth daily.    Blain Pais BERRY PO Take 1  tablet by mouth daily. (Patient not taking: Reported on 11/27/2019)    . Cyanocobalamin (VITAMIN B 12 PO) Take 1 tablet by mouth daily. (Patient not taking: Reported on 11/27/2019)    . linaclotide (LINZESS) 72 MCG capsule Take 1 capsule (72 mcg total) by mouth daily as needed. (Patient not taking: Reported on 11/27/2019) 30 capsule 5  . valACYclovir (VALTREX) 500 MG tablet Take 1 tablet by mouth twice daily (Patient not taking: Reported on 11/27/2019) 30 tablet 0   No facility-administered medications prior to visit.     Review of Systems:   Constitutional:   No  weight loss, night sweats,  Fevers, chills, fatigue, or  lassitude.  HEENT:   No  headaches,  Difficulty swallowing,  Tooth/dental problems, or  Sore throat,                No sneezing, itching, ear ache, nasal congestion, post nasal drip,   CV:  No chest pain,  Orthopnea, PND, swelling in lower extremities, anasarca, dizziness, palpitations, syncope.   GI  No heartburn, indigestion, abdominal pain, nausea, vomiting, diarrhea, change in bowel habits, loss of appetite, bloody stools.   Resp: No shortness of breath with exertion or at rest.  No excess mucus, no productive cough,  No non-productive cough,  No coughing up of blood.  No change in color of mucus.  No wheezing.  No chest wall deformity  Skin: no rash or lesions.  GU: no dysuria, change in color of urine, no urgency or frequency.  No flank pain, no hematuria   MS:  No joint pain or swelling.  No decreased range of motion.  No back pain.    Physical Exam  BP 120/86 (BP Location: Left Arm, Cuff Size: Normal)   Pulse 86   Temp (!) 97.4 F (36.3 C) (Temporal)   Ht 5\' 5"  (1.651 m)   Wt 203 lb 9.6 oz (92.4 kg)   LMP 01/20/2016   SpO2 100%   BMI 33.88 kg/m   GEN: A/Ox3; pleasant , NAD, well nourished    HEENT:  [place]/AT,   NOSE-clear, THROAT-clear, no lesions, no postnasal drip or exudate noted. Class 2 MP airway   NECK:  Supple w/ fair ROM; no JVD; normal carotid impulses w/o bruits; no thyromegaly or nodules palpated; no lymphadenopathy.    RESP  Clear  P & A; w/o, wheezes/ rales/ or rhonchi. no accessory muscle use, no dullness to percussion  CARD:  RRR, no m/r/g, no peripheral edema, pulses intact, no cyanosis or clubbing.  GI:   Soft & nt; nml bowel sounds; no organomegaly or masses detected.   Musco: Warm bil, no deformities or joint swelling noted.   Neuro: alert, no focal deficits noted.    Skin: Warm, no lesions or rashes    Lab Results:   BNP No results found for: BNP  ProBNP No results found for: PROBNP  Imaging: No results found.    No flowsheet data found.  No results  found for: NITRICOXIDE      Assessment & Plan:   OSA (obstructive sleep apnea) Mild obstructive sleep apnea with significant nocturnal desaturations.  Patient has significant symptoms of sleep apnea with daytime sleepiness snoring and restless sleep.  She also is a truck Programmer, systems.  Patient education on underlying obstructive sleep apnea, treatment options including CPAP and oral appliance and weight loss.  Due to patient's significant symptomology and nocturnal desaturations along with her occupation she will proceed with CPAP.  Patient education  on CPAP compliance.  Did discuss CDL licensure requirements She does not have Internet services.  She will need an SD Card   Plan  Patient Instructions  You have mild sleep apnea.   Begin CPAP at bedtime Goal is to wear CPAP for at least 4 hours or more each night (no internet, needs SD card)  Work on healthy weight loss Do not drive if sleepy Follow-up in 3 months with Dr. Halford Chessman or Mavery Milling NP and As needed  (Bring SD card)        Morbid obesity ([place]) Healthy weight loss discussed.       Rexene Edison, NP 11/27/2019

## 2019-11-27 NOTE — Addendum Note (Signed)
Addended by: Vanessa Barbara on: 11/27/2019 12:28 PM   Modules accepted: Orders

## 2019-11-27 NOTE — Assessment & Plan Note (Signed)
Mild obstructive sleep apnea with significant nocturnal desaturations.  Patient has significant symptoms of sleep apnea with daytime sleepiness snoring and restless sleep.  She also is a truck Programmer, systems.  Patient education on underlying obstructive sleep apnea, treatment options including CPAP and oral appliance and weight loss.  Due to patient's significant symptomology and nocturnal desaturations along with her occupation she will proceed with CPAP.  Patient education on CPAP compliance.  Did discuss CDL licensure requirements She does not have Internet services.  She will need an SD Card   Plan  Patient Instructions  You have mild sleep apnea.   Begin CPAP at bedtime Goal is to wear CPAP for at least 4 hours or more each night (no internet, needs SD card)  Work on healthy weight loss Do not drive if sleepy Follow-up in 3 months with Dr. Halford Chessman or Sharyah Bostwick NP and As needed  (Bring SD card)

## 2019-11-27 NOTE — Assessment & Plan Note (Signed)
Healthy weight loss discussed 

## 2019-11-27 NOTE — Patient Instructions (Addendum)
You have mild sleep apnea.   Begin CPAP at bedtime Goal is to wear CPAP for at least 4 hours or more each night (no internet, needs SD card)  Work on healthy weight loss Do not drive if sleepy Follow-up in 3 months with Dr. Halford Curtis or Kellie Griesinger NP and As needed  (Bring SD card)

## 2019-11-28 NOTE — Progress Notes (Signed)
Reviewed and agree with assessment/plan.   Chesley Mires, MD [facility] Pulmonary/Critical Care 11/28/2019, 8:53 AM Pager:  [phone]

## 2019-11-30 ENCOUNTER — Emergency Department (HOSPITAL_COMMUNITY): Payer: BC Managed Care – PPO

## 2019-11-30 ENCOUNTER — Emergency Department (HOSPITAL_COMMUNITY)
Admission: EM | Admit: 2019-11-30 | Discharge: 2019-11-30 | Disposition: A | Discharge status: Home / Self Care | Payer: BC Managed Care – PPO | Attending: Emergency Medicine | Admitting: Emergency Medicine

## 2019-11-30 ENCOUNTER — Other Ambulatory Visit: Payer: Self-pay

## 2019-11-30 ENCOUNTER — Telehealth: Payer: Self-pay | Admitting: Internal Medicine

## 2019-11-30 DIAGNOSIS — F329 Major depressive disorder, single episode, unspecified: Secondary | ICD-10-CM | POA: Diagnosis not present

## 2019-11-30 DIAGNOSIS — R05 Cough: Secondary | ICD-10-CM | POA: Insufficient documentation

## 2019-11-30 DIAGNOSIS — J029 Acute pharyngitis, unspecified: Secondary | ICD-10-CM | POA: Diagnosis present

## 2019-11-30 DIAGNOSIS — E785 Hyperlipidemia, unspecified: Secondary | ICD-10-CM | POA: Diagnosis not present

## 2019-11-30 DIAGNOSIS — K5900 Constipation, unspecified: Secondary | ICD-10-CM | POA: Diagnosis not present

## 2019-11-30 DIAGNOSIS — J0110 Acute frontal sinusitis, unspecified: Secondary | ICD-10-CM | POA: Insufficient documentation

## 2019-11-30 DIAGNOSIS — Z86010 Personal history of colonic polyps: Secondary | ICD-10-CM | POA: Insufficient documentation

## 2019-11-30 DIAGNOSIS — Z79899 Other long term (current) drug therapy: Secondary | ICD-10-CM | POA: Insufficient documentation

## 2019-11-30 DIAGNOSIS — J45909 Unspecified asthma, uncomplicated: Secondary | ICD-10-CM | POA: Diagnosis not present

## 2019-11-30 DIAGNOSIS — F419 Anxiety disorder, unspecified: Secondary | ICD-10-CM | POA: Insufficient documentation

## 2019-11-30 DIAGNOSIS — J069 Acute upper respiratory infection, unspecified: Secondary | ICD-10-CM | POA: Insufficient documentation

## 2019-11-30 MED ORDER — PROMETHAZINE-DM 6.25-15 MG/5ML PO SYRP: 5.0000 mL | ORAL_SOLUTION | Freq: Four times a day (QID) | ORAL | 0 refills | Status: DC | PRN

## 2019-11-30 MED ORDER — FLUTICASONE PROPIONATE 50 MCG/ACT NA SUSP: 2.0000 | Freq: Every day | NASAL | 0 refills | Status: DC

## 2019-11-30 MED ORDER — AMOXICILLIN-POT CLAVULANATE 500-125 MG PO TABS: 1.0000 | ORAL_TABLET | Freq: Three times a day (TID) | ORAL | 0 refills | Status: AC

## 2019-11-30 NOTE — Discharge Instructions (Addendum)
You have been diagnosed with a sinus infection.  The majority of these are viral in nature however we are treating with a antibiotics since it has been ongoing for 14 days.  Please use a Netti pot he can buy this at [facility], [facility], [facility], [employer] or any other such store.  This will help clean her sinuses.  Please use Zyrtec 10 mg daily.  Please use the Flonase that I prescribed you 2 sprays in each nostril twice daily.  I have also prescribed you a cough syrup to use at home.  If this is not make you drowsy may use at work.  I also recommend you follow-up with your pulmonologist.  You may return to the emergency department for reevaluation anytime.

## 2019-11-30 NOTE — Telephone Encounter (Signed)
Sees Dr Halford Chessman for OSA. Now tryin go to [place] on 12/02/19 for pulmonary. Last seen at [facility] by someone called Lucy-> last week. Diagnosis given "not getting enough air or oxygen from the bottom of the lung".  No hemoptysis/. Now having sorte throat, hemoptysis, black stuff - new   Plan - go to an urgent care - nearest because not established at [facility] for pulmonary as yet      SIGNATURE    Dr. Brand Males, M.D., F.C.C.P,  Pulmonary and Critical Care Medicine Staff Physician, [place] Director - Interstitial Lung Disease  Program  Pulmonary [place] at [place], [place], [address]  Pager: [phone], If no answer  OR between  19:00-7:00h: page [phone] Telephone (clinical office): [phone] Telephone (research): [phone]  9:21 AM 11/30/2019  [email or web address]     has a past medical history of Abdominal pain, left lower quadrant, ADHD (attention deficit hyperactivity disorder), Allergic rhinitis, Anemia, Anxiety state, unspecified, Asthma, Depression, Dyslexia, Dysmenorrhea, Elevated blood pressure reading without diagnosis of hypertension, GERD (gastroesophageal reflux disease), History of adenomatous polyp of colon, Hyperlipidemia, IBS (irritable bowel syndrome), Menorrhagia, Other convulsions, Seizure disorder ([place]), and Uterus, adenomyosis.   reports that she has never smoked. She has never used smokeless tobacco.  Past Surgical History:  Procedure Laterality Date  . CESAREAN SECTION  x2  last one 02-22-2005   Bilateral Tubal Ligation w/ last one  . COLONOSCOPY  last one 11-27-2015  . LAPAROSCOPIC VAGINAL HYSTERECTOMY WITH SALPINGECTOMY Bilateral 02/01/2016   Procedure: LAPAROSCOPIC ASSISTED VAGINAL HYSTERECTOMY WITH SALPINGECTOMY;  Surgeon: Arvella Nigh, MD;  Location: [place];  Service: Gynecology;  Laterality: Bilateral;  .  TONSILLECTOMY      Allergies  Allergen Reactions  . Latex Rash  . Adhesive [Tape] Rash  . Hydrocodone-Homatropine Itching    Immunization History  Administered Date(s) Administered  . Influenza,inj,Quad PF,6+ Mos 03/16/2017  . PFIZER SARS-COV-2 Vaccination 07/06/2019, 07/27/2019    Family History  Problem Relation Age of Onset  . Stroke Father   . Hypertension Father   . Gout Father   . Hyperlipidemia Father   . Colon cancer Father   . Asthma Mother   . Alcohol abuse Other        grandfather  . Breast cancer Other        grandmother  . Hypertension Other        grandmother  . Asthma Other        grandmother  . Breast cancer Maternal Grandmother   . Esophageal cancer Neg Hx   . Stomach cancer Neg Hx   . Rectal cancer Neg Hx      Current Outpatient Medications:  .  ACAI BERRY PO, Take 1 tablet by mouth daily. (Patient not taking: Reported on 11/27/2019), Disp: , Rfl:  .  albuterol (VENTOLIN HFA) 108 (90 Base) MCG/ACT inhaler, Inhale 2 puffs into the lungs every 4 (four) hours as needed for wheezing or shortness of breath., Disp: , Rfl:  .  amphetamine-dextroamphetamine (ADDERALL) 5 MG tablet, Take 1 tablet (5 mg total) by mouth daily as needed. (Patient taking differently: Take 5 mg by mouth daily as needed (attention deficit). ), Disp: 30 tablet, Rfl: 0 .  Ascorbic Acid (VITAMIN C) 100 MG tablet, Take 100 mg by mouth daily., Disp: , Rfl:  .  Cyanocobalamin (VITAMIN B 12 PO), Take 1 tablet by mouth daily. (Patient  not taking: Reported on 11/27/2019), Disp: , Rfl:  .  linaclotide (LINZESS) 72 MCG capsule, Take 1 capsule (72 mcg total) by mouth daily as needed. (Patient not taking: Reported on 11/27/2019), Disp: 30 capsule, Rfl: 5 .  methylcellulose (CITRUCEL) oral powder, Take 2 tablespoons in 8oz of water every day., Disp: , Rfl:  .  methylphenidate (METADATE CD) 40 MG CR capsule, Take 1 capsule (40 mg total) by mouth every morning. (Patient taking differently: Take 40 mg by  mouth daily as needed (attention deficit). ), Disp: 30 capsule, Rfl: 0 .  Multiple Vitamins-Minerals (ALIVE WOMENS GUMMY PO), Take 1 tablet by mouth daily., Disp: , Rfl:  .  pantoprazole (PROTONIX) 40 MG tablet, Take 1 tablet (40 mg total) by mouth daily., Disp: 90 tablet, Rfl: 3 .  polyethylene glycol (MIRALAX / GLYCOLAX) 17 g packet, Take 17 g by mouth daily., Disp: , Rfl:  .  valACYclovir (VALTREX) 500 MG tablet, Take 1 tablet by mouth twice daily (Patient not taking: Reported on 11/27/2019), Disp: 30 tablet, Rfl: 0 .  VITAMIN D PO, Take 1 capsule by mouth daily., Disp: , Rfl:

## 2019-11-30 NOTE — ED Provider Notes (Signed)
[facility] DEPT Provider Note   CSN: [id number] Arrival date & time: 11/30/19  0930     History Chief Complaint  Patient presents with  . Sore Throat    Kellie Curtis is a 49 y.o. female.  HPI  Patient is a 49 year old female with past medical history of anxiety, depression, hyperlipidemia, constipation  Patient presented today for chief complaint of sore throat and congestion.  She states she is also having associated cough has been primarily dry but occasionally she will cough up some mucus feels like she can also feel it slightly down the back of her throat.  Today she coughed up a faint bit of dark saliva and when she googled this was concerned about fungal lung infection. She came to ED for advice on this. She does see a pulmonologist for sleep apnea. No hx of PE and no CP/SOB hemoptysis or unilateral leg swelling or pain. No recent travel/surgery no OCPs. She states she has some congestion but no other major symptoms.      Past Medical History:  Diagnosis Date  . Abdominal pain, left lower quadrant   . ADHD (attention deficit hyperactivity disorder)   . Allergic rhinitis   . Anemia   . Anxiety state, unspecified   . Asthma    Controlled  . Depression   . Dyslexia   . Dysmenorrhea   . Elevated blood pressure reading without diagnosis of hypertension   . GERD (gastroesophageal reflux disease)   . History of adenomatous polyp of colon    tubular adenoma's 2012;  2017  . Hyperlipidemia   . IBS (irritable bowel syndrome)   . Menorrhagia   . Other convulsions   . Seizure disorder ([place])   . Uterus, adenomyosis     Patient Active Problem List   Diagnosis Date Noted  . Sinobronchitis 12/02/2019  . OSA (obstructive sleep apnea) 11/27/2019  . Morbid obesity ([place]) 11/27/2019  . Rectal bleeding 08/21/2019  . Daytime somnolence 08/26/2017  . Acute upper respiratory infection 03/16/2017  . Urinary incontinence 03/16/2017  . Influenza  06/24/2016  . Cough 04/13/2016  . Lymphadenitis 03/01/2016  . S/P laparoscopic assisted vaginal hysterectomy (LAVH) 02/01/2016  . Perimenopause 06/06/2014  . Thickened endometrium 06/06/2014  . Low grade squamous intraepithelial lesion (LGSIL) on Papanicolaou smear of cervix 05/25/2014  . Hemoptysis 05/10/2013  . Chest pain 01/11/2013  . Anemia, unspecified 10/04/2012  . Allergic rhinitis 03/10/2012  . Irregular menses 05/30/2011  . Irritable bowel syndrome with constipation 05/30/2011  . Encounter for well adult exam with abnormal findings 05/28/2011  . GERD 05/25/2010  . Diaphragmatic hernia 05/21/2010  . COLONIC POLYPS, HX OF 05/21/2010  . Constipation 04/27/2010  . Wheezing 12/16/2008  . Anxiety 03/14/2008  . Depression 03/14/2008  . Attention deficit hyperactivity disorder (ADHD) 03/14/2008  . SEIZURE DISORDER 02/13/2008  . HYPERLIPIDEMIA 02/07/2008  . BACK PAIN 02/07/2008    Past Surgical History:  Procedure Laterality Date  . CESAREAN SECTION  x2  last one 02-22-2005   Bilateral Tubal Ligation w/ last one  . COLONOSCOPY  last one 11-27-2015  . LAPAROSCOPIC VAGINAL HYSTERECTOMY WITH SALPINGECTOMY Bilateral 02/01/2016   Procedure: LAPAROSCOPIC ASSISTED VAGINAL HYSTERECTOMY WITH SALPINGECTOMY;  Surgeon: Arvella Nigh, MD;  Location: [facility];  Service: Gynecology;  Laterality: Bilateral;  . TONSILLECTOMY       OB History    Gravida  3   Para  3   Term      Preterm  AB      Living  3     SAB      TAB      Ectopic      Multiple      Live Births              Family History  Problem Relation Age of Onset  . Stroke Father   . Hypertension Father   . Gout Father   . Hyperlipidemia Father   . Colon cancer Father   . Asthma Mother   . Alcohol abuse Other        grandfather  . Breast cancer Other        grandmother  . Hypertension Other        grandmother  . Asthma Other        grandmother  . Breast cancer Maternal  Grandmother   . Esophageal cancer Neg Hx   . Stomach cancer Neg Hx   . Rectal cancer Neg Hx     Social History   Tobacco Use  . Smoking status: Never Smoker  . Smokeless tobacco: Never Used  Substance Use Topics  . Alcohol use: Yes    Alcohol/week: 2.0 standard drinks    Types: 2 Cans of beer per week  . Drug use: No    Home Medications Prior to Admission medications   Medication Sig Start Date End Date Taking? Authorizing Provider  ACAI BERRY PO Take 1 tablet by mouth daily.     [provider]  albuterol (VENTOLIN HFA) 108 (90 Base) MCG/ACT inhaler Inhale 2 puffs into the lungs every 4 (four) hours as needed for wheezing or shortness of breath.    [provider]  amoxicillin-clavulanate (AUGMENTIN) 500-125 MG tablet Take 1 tablet (500 mg total) by mouth every 8 (eight) hours for 7 days. 11/30/19 12/07/19  Tedd Sias, PA  amphetamine-dextroamphetamine (ADDERALL) 5 MG tablet Take 1 tablet (5 mg total) by mouth daily as needed. Patient taking differently: Take 5 mg by mouth daily as needed (attention deficit).  08/14/19   Biagio Borg, MD  Ascorbic Acid (VITAMIN C) 100 MG tablet Take 100 mg by mouth daily.    [provider]  Cyanocobalamin (VITAMIN B 12 PO) Take 1 tablet by mouth daily.     [provider]  fluticasone (FLONASE) 50 MCG/ACT nasal spray Place 2 sprays into both nostrils daily for 14 days. 11/30/19 12/14/19  Tedd Sias, PA  linaclotide (LINZESS) 72 MCG capsule Take 1 capsule (72 mcg total) by mouth daily as needed. 08/14/19   Biagio Borg, MD  methylcellulose (CITRUCEL) oral powder Take 2 tablespoons in 8oz of water every day. 08/22/19   Cherene Altes, MD  methylphenidate (METADATE CD) 40 MG CR capsule Take 1 capsule (40 mg total) by mouth every morning. Patient taking differently: Take 40 mg by mouth daily as needed (attention deficit).  08/14/19   Biagio Borg, MD  Multiple Vitamins-Minerals (ALIVE WOMENS GUMMY PO) Take  1 tablet by mouth daily.    [provider]  pantoprazole (PROTONIX) 40 MG tablet Take 1 tablet (40 mg total) by mouth daily. 08/14/19   Biagio Borg, MD  polyethylene glycol (MIRALAX / GLYCOLAX) 17 g packet Take 17 g by mouth daily.    [provider]  predniSONE (DELTASONE) 10 MG tablet 2 tablets daily x 5 days; then 1 tablet x 5 days; 1/2 tab x 5 days; then stop 12/02/19   Martyn Ehrich, NP  promethazine-dextromethorphan (PROMETHAZINE-DM) 6.25-15 MG/5ML syrup Take 5 mLs by mouth 4 (four) times daily as needed for cough. 11/30/19   Tedd Sias, PA  valACYclovir (VALTREX) 500 MG tablet Take 1 tablet by mouth twice daily 08/19/19   Biagio Borg, MD  VITAMIN D PO Take 1 capsule by mouth daily.    [provider]    Allergies    Latex, Adhesive [tape], and Hydrocodone-homatropine  Review of Systems   Review of Systems  Constitutional: Negative for chills and fever.  HENT: Positive for congestion.   Eyes: Negative for pain.  Respiratory: Positive for cough. Negative for shortness of breath.   Cardiovascular: Negative for chest pain and leg swelling.  Gastrointestinal: Negative for abdominal pain and vomiting.  Genitourinary: Negative for dysuria.  Musculoskeletal: Negative for myalgias.  Skin: Negative for rash.  Neurological: Negative for dizziness and headaches.    Physical Exam Updated Vital Signs BP 115/83 (BP Location: Right Arm)   Pulse 84   Temp 98 F (36.7 C)   Resp 20   Ht 5\' 5"  (1.651 m)   Wt 92.4 kg   LMP 01/20/2016   SpO2 100%   BMI 33.88 kg/m   Physical Exam Vitals and nursing note reviewed.  Constitutional:      General: She is not in acute distress. HENT:     Head: Normocephalic and atraumatic.     Nose: Congestion present.     Comments: Frontal sinus TTP no max TTP    Mouth/Throat:     Mouth: Mucous membranes are moist.  Eyes:     General: No scleral icterus. Cardiovascular:     Rate and Rhythm: Normal rate and  regular rhythm.     Pulses: Normal pulses.     Heart sounds: Normal heart sounds.  Pulmonary:     Effort: Pulmonary effort is normal. No respiratory distress.     Breath sounds: Normal breath sounds. No wheezing.  Abdominal:     Palpations: Abdomen is soft.     Tenderness: There is no abdominal tenderness.  Musculoskeletal:     Cervical back: Normal range of motion.     Right lower leg: No edema.     Left lower leg: No edema.  Skin:    General: Skin is warm and dry.     Capillary Refill: Capillary refill takes less than 2 seconds.  Neurological:     Mental Status: She is alert. Mental status is at baseline.  Psychiatric:        Mood and Affect: Mood normal.        Behavior: Behavior normal.     Comments: Anxious; pleasant     ED Results / Procedures / Treatments   Labs (all labs ordered are listed, but only abnormal results are displayed) Labs Reviewed - No data to display  EKG None  Radiology DG Chest 2 View  Result Date: 11/30/2019 CLINICAL DATA:  Cough and sore throat. EXAM: CHEST - 2 VIEW COMPARISON:  Radiograph 11/17/2018 FINDINGS: The cardiomediastinal contours are normal. The lungs are clear. Pulmonary vasculature is normal. No consolidation, pleural effusion, or pneumothorax. No acute osseous abnormalities are seen. IMPRESSION: No acute chest findings. Electronically Signed   By: Keith Rake M.D.   On: 11/30/2019 16:33    Procedures Procedures (including critical care time)  Medications Ordered in ED Medications - No data to display  ED Course  I have reviewed the triage vital signs and the nursing notes.  Pertinent labs & imaging results that  were available during my care of the patient were reviewed by me and considered in my medical decision making (see chart for details).    MDM Rules/Calculators/A&P                          Patient is 49 year old female with PMH detailed above presenting today for symptoms discussed in HPI primarily cough,  congestion sinus pressure and ST.   She has had > 10 days of sinus symptoms and has TTP of frontal sinuses. No fever but will empirically treat with augmentin d/t my concern for sinus infection. Pulm exam normal. She is well appearing and posterior pharynx is clear iwht minor PND.   Will DC with pulmonollist follow up with her established Pulm doctors and given augmentin, fluticasone and cough syrup.   She was recently tested - for covid and declined covid testing today.   Kellie Curtis was evaluated in Emergency Department on 12/02/2019 for the symptoms described in the history of present illness. She was evaluated in the context of the global COVID-19 pandemic, which necessitated consideration that the patient might be at risk for infection with the SARS-CoV-2 virus that causes COVID-19. Institutional protocols and algorithms that pertain to the evaluation of patients at risk for COVID-19 are in a state of rapid change based on information released by regulatory bodies including the CDC and federal and state organizations. These policies and algorithms were followed during the patient's care in the ED.   DC at this time with [facility] WNL.  Final Clinical Impression(s) / ED Diagnoses Final diagnoses:  Acute non-recurrent frontal sinusitis  Viral upper respiratory tract infection with cough    Rx / DC Orders ED Discharge Orders         Ordered    amoxicillin-clavulanate (AUGMENTIN) 500-125 MG tablet  Every 8 hours        11/30/19 1642    fluticasone (FLONASE) 50 MCG/ACT nasal spray  Daily        11/30/19 1642    promethazine-dextromethorphan (PROMETHAZINE-DM) 6.25-15 MG/5ML syrup  4 times daily PRN        11/30/19 1642           Lavone Orn [place], [place] 12/02/19 1449    Carmin Muskrat, MD 12/03/19 0004

## 2019-11-30 NOTE — ED Triage Notes (Signed)
Patient reports she has had issues with sore throat and allergies since 8/13. States she has been on antibiotics and finished those. Patient says she was doing better but woke up today and her throat is sore again and she coughed up blood and a black substance that looks like soot. Pain rated 10/10

## 2019-12-02 ENCOUNTER — Other Ambulatory Visit: Payer: Self-pay

## 2019-12-02 ENCOUNTER — Ambulatory Visit: Payer: BC Managed Care – PPO | Admitting: Primary Care

## 2019-12-02 ENCOUNTER — Encounter: Payer: Self-pay | Admitting: Primary Care

## 2019-12-02 VITALS — HR 88 | Temp 97.1°F | Ht 65.0 in | Wt 200.0 lb

## 2019-12-02 DIAGNOSIS — J329 Chronic sinusitis, unspecified: Secondary | ICD-10-CM | POA: Diagnosis not present

## 2019-12-02 DIAGNOSIS — G4733 Obstructive sleep apnea (adult) (pediatric): Secondary | ICD-10-CM

## 2019-12-02 DIAGNOSIS — J40 Bronchitis, not specified as acute or chronic: Secondary | ICD-10-CM | POA: Diagnosis not present

## 2019-12-02 MED ORDER — PREDNISONE 10 MG PO TABS: ORAL_TABLET | ORAL | 0 refills | Status: DC

## 2019-12-02 NOTE — Patient Instructions (Addendum)
Sinusitis: - Continue Augmentin 1 tab twice daily until completed - Continue Flonase nasal spray 1 puff per nostril daily - Start prednisone 20mg  x 5 days; 10mg  x 5 days - Use Albuterol 2 puffs every 6 hours for shortness of breath or wheezing  Orders: - Pulmonary function testing or Spirometry (which ever can be done to fit her scheduled) - we will call you after testing    Sinusitis, Adult Sinusitis is soreness and swelling (inflammation) of your sinuses. Sinuses are hollow spaces in the bones around your face. They are located:  Around your eyes.  In the middle of your forehead.  Behind your nose.  In your cheekbones. Your sinuses and nasal passages are lined with a fluid called mucus. Mucus drains out of your sinuses. Swelling can trap mucus in your sinuses. This lets germs (bacteria, virus, or fungus) grow, which leads to infection. Most of the time, this condition is caused by a virus. What are the causes? This condition is caused by:  Allergies.  Asthma.  Germs.  Things that block your nose or sinuses.  Growths in the nose (nasal polyps).  Chemicals or irritants in the air.  Fungus (rare). What increases the risk? You are more likely to develop this condition if:  You have a weak body defense system (immune system).  You do a lot of swimming or diving.  You use nasal sprays too much.  You smoke. What are the signs or symptoms? The main symptoms of this condition are pain and a feeling of pressure around the sinuses. Other symptoms include:  Stuffy nose (congestion).  Runny nose (drainage).  Swelling and warmth in the sinuses.  Headache.  Toothache.  A cough that may get worse at night.  Mucus that collects in the throat or the back of the nose (postnasal drip).  Being unable to smell and taste.  Being very tired (fatigue).  A fever.  Sore throat.  Bad breath. How is this diagnosed? This condition is diagnosed based on:  Your  symptoms.  Your medical history.  A physical exam.  Tests to find out if your condition is short-term (acute) or long-term (chronic). Your doctor may: ? Check your nose for growths (polyps). ? Check your sinuses using a tool that has a light (endoscope). ? Check for allergies or germs. ? Do imaging tests, such as an MRI or CT scan. How is this treated? Treatment for this condition depends on the cause and whether it is short-term or long-term.  If caused by a virus, your symptoms should go away on their own within 10 days. You may be given medicines to relieve symptoms. They include: ? Medicines that shrink swollen tissue in the nose. ? Medicines that treat allergies (antihistamines). ? A spray that treats swelling of the nostrils. ? Rinses that help get rid of thick mucus in your nose (nasal saline washes).  If caused by bacteria, your doctor may wait to see if you will get better without treatment. You may be given antibiotic medicine if you have: ? A very bad infection. ? A weak body defense system.  If caused by growths in the nose, you may need to have surgery. Follow these instructions at home: Medicines  Take, use, or apply over-the-counter and prescription medicines only as told by your doctor. These may include nasal sprays.  If you were prescribed an antibiotic medicine, take it as told by your doctor. Do not stop taking the antibiotic even if you start to feel better.  Hydrate and humidify   Drink enough water to keep your pee (urine) pale yellow.  Use a cool mist humidifier to keep the humidity level in your home above 50%.  Breathe in steam for 10-15 minutes, 3-4 times a day, or as told by your doctor. You can do this in the bathroom while a hot shower is running.  Try not to spend time in cool or dry air. Rest  Rest as much as you can.  Sleep with your head raised (elevated).  Make sure you get enough sleep each night. General instructions   Put a  warm, moist washcloth on your face 3-4 times a day, or as often as told by your doctor. This will help with discomfort.  Wash your hands often with soap and water. If there is no soap and water, use hand sanitizer.  Do not smoke. Avoid being around people who are smoking (secondhand smoke).  Keep all follow-up visits as told by your doctor. This is important. Contact a doctor if:  You have a fever.  Your symptoms get worse.  Your symptoms do not get better within 10 days. Get help right away if:  You have a very bad headache.  You cannot stop throwing up (vomiting).  You have very bad pain or swelling around your face or eyes.  You have trouble seeing.  You feel confused.  Your neck is stiff.  You have trouble breathing. Summary  Sinusitis is swelling of your sinuses. Sinuses are hollow spaces in the bones around your face.  This condition is caused by tissues in your nose that become inflamed or swollen. This traps germs. These can lead to infection.  If you were prescribed an antibiotic medicine, take it as told by your doctor. Do not stop taking it even if you start to feel better.  Keep all follow-up visits as told by your doctor. This is important. This information is not intended to replace advice given to you by your health care provider. Make sure you discuss any questions you have with your health care provider. Document Revised: 08/21/2017 Document Reviewed: 08/21/2017 Elsevier Patient Education  [place].

## 2019-12-02 NOTE — Assessment & Plan Note (Signed)
-   Patient has had sinusitis symptoms for 2 weeks with associated cough/wheezing. CXR on 8/28 showed clear lungs, COVID negative  - Continue Augmentin 1 tab twice daily until complete  - Continue Flonase nasal spray 1 puff per nostril daily - Start prednisone 20mg  x 5 days; 10mg  x 5 days; 5mg  x 5 days; stop  - Use Albuterol 2 puffs every 6 hours for shortness of breath or wheezing  Orders: - Pulmonary function testing or Spirometry

## 2019-12-02 NOTE — Progress Notes (Signed)
Reviewed and agree with assessment/plan.   Chesley Mires, MD [facility] Pulmonary/Critical Care 12/02/2019, 2:48 PM Pager:  [phone]

## 2019-12-02 NOTE — Progress Notes (Signed)
@Patient  ID: Kellie Curtis, female    DOB: 1970-06-01, 49 y.o.   MRN: [id number]  Chief Complaint  Patient presents with  . Follow-up    reports no change in breathing since starting antibiotics, promethezine, and nasal spray this past saturday. pt believes environmental allergen at her place of residence    Referring provider: Biagio Borg, MD  HPI: 49 year old female, never smoked.  Past medical history significant for obstructive sleep apnea.  Patient of Dr. Halford Chessman, patient last seen on 11/27/2019 by pulmonary nurse practitioner.  Home sleep study showed mild obstructive sleep apnea.  She was started on CPAP (needs SD card).  Recommended follow-up in 3 months.  12/02/2019 Patient presents today for acute office visit.  She was just recently seen in our office 5 days ago for OSA. She has been sick with Sinusitis symptoms since August 13th. She has associated cough and wheezing. She has been on doxycycline and tessalon Perles with no improvement. States that two days ago on Saturday August 28th she woke up coughing. Her cough was productive with slight blood tinge and black specks. She presents to [facility] ED for acute nonrecurrent frontal sinusitis.  CXR showed clear lungs. COVID was negative. She was given course of Augmentin, Flonase and promethazine/Dm cough syrup. She uses albuterol rescue inhaler every 6 hours with some temporarily improvement. Tells me that she had previous lung function testing scheduled for last Friday 8/27 with her PCP but she was unable to do these. She was under the impression that she could have pulmonary function test today with our office. She showed me a picture of a single fluid filled blister on her abdomen, this has since resolved resolved. She used pepermint oil to dry it out. No fevers.   Allergies  Allergen Reactions  . Latex Rash  . Adhesive [Tape] Rash  . Hydrocodone-Homatropine Itching    Immunization History  Administered Date(s) Administered  .  Influenza,inj,Quad PF,6+ Mos 03/16/2017  . PFIZER SARS-COV-2 Vaccination 07/06/2019, 07/27/2019    Past Medical History:  Diagnosis Date  . Abdominal pain, left lower quadrant   . ADHD (attention deficit hyperactivity disorder)   . Allergic rhinitis   . Anemia   . Anxiety state, unspecified   . Asthma    Controlled  . Depression   . Dyslexia   . Dysmenorrhea   . Elevated blood pressure reading without diagnosis of hypertension   . GERD (gastroesophageal reflux disease)   . History of adenomatous polyp of colon    tubular adenoma's 2012;  2017  . Hyperlipidemia   . IBS (irritable bowel syndrome)   . Menorrhagia   . Other convulsions   . Seizure disorder ([place])   . Uterus, adenomyosis     Tobacco History: Social History   Tobacco Use  Smoking Status Never Smoker  Smokeless Tobacco Never Used   Counseling given: Not Answered   Outpatient Medications Prior to Visit  Medication Sig Dispense Refill  . ACAI BERRY PO Take 1 tablet by mouth daily.     Marland Kitchen albuterol (VENTOLIN HFA) 108 (90 Base) MCG/ACT inhaler Inhale 2 puffs into the lungs every 4 (four) hours as needed for wheezing or shortness of breath.    Marland Kitchen amoxicillin-clavulanate (AUGMENTIN) 500-125 MG tablet Take 1 tablet (500 mg total) by mouth every 8 (eight) hours for 7 days. 21 tablet 0  . amphetamine-dextroamphetamine (ADDERALL) 5 MG tablet Take 1 tablet (5 mg total) by mouth daily as needed. (Patient taking differently: Take 5 mg by  mouth daily as needed (attention deficit). ) 30 tablet 0  . Ascorbic Acid (VITAMIN C) 100 MG tablet Take 100 mg by mouth daily.    . Cyanocobalamin (VITAMIN B 12 PO) Take 1 tablet by mouth daily.     . fluticasone (FLONASE) 50 MCG/ACT nasal spray Place 2 sprays into both nostrils daily for 14 days. 11.1 mL 0  . linaclotide (LINZESS) 72 MCG capsule Take 1 capsule (72 mcg total) by mouth daily as needed. 30 capsule 5  . methylcellulose (CITRUCEL) oral powder Take 2 tablespoons in 8oz of water  every day.    . methylphenidate (METADATE CD) 40 MG CR capsule Take 1 capsule (40 mg total) by mouth every morning. (Patient taking differently: Take 40 mg by mouth daily as needed (attention deficit). ) 30 capsule 0  . Multiple Vitamins-Minerals (ALIVE WOMENS GUMMY PO) Take 1 tablet by mouth daily.    . pantoprazole (PROTONIX) 40 MG tablet Take 1 tablet (40 mg total) by mouth daily. 90 tablet 3  . polyethylene glycol (MIRALAX / GLYCOLAX) 17 g packet Take 17 g by mouth daily.    . promethazine-dextromethorphan (PROMETHAZINE-DM) 6.25-15 MG/5ML syrup Take 5 mLs by mouth 4 (four) times daily as needed for cough. 118 mL 0  . valACYclovir (VALTREX) 500 MG tablet Take 1 tablet by mouth twice daily 30 tablet 0  . VITAMIN D PO Take 1 capsule by mouth daily.     No facility-administered medications prior to visit.    Review of Systems  Review of Systems  HENT: Positive for congestion and sinus pressure.   Respiratory: Positive for cough, shortness of breath and wheezing.    Physical Exam  Pulse 88   Temp (!) 97.1 F (36.2 C)   Ht 5\' 5"  (1.651 m)   Wt 200 lb (90.7 kg)   LMP 01/20/2016   SpO2 98%   BMI 33.28 kg/m  Physical Exam Constitutional:      Appearance: Normal appearance.  HENT:     Head: Normocephalic and atraumatic.  Neck:     Comments: Slight fullness appreciated right parotid gland  Cardiovascular:     Rate and Rhythm: Normal rate and regular rhythm.  Pulmonary:     Effort: Pulmonary effort is normal.     Breath sounds: Normal breath sounds. No wheezing or rhonchi.  Neurological:     General: No focal deficit present.     Mental Status: She is alert and oriented to person, place, and time. Mental status is at baseline.  Psychiatric:        Mood and Affect: Mood normal.        Behavior: Behavior normal.        Thought Content: Thought content normal.        Judgment: Judgment normal.      Lab Results:  CBC    Component Value Date/Time   WBC 4.0 08/22/2019 0536     RBC 4.33 08/22/2019 0536   HGB 13.2 08/22/2019 0536   HCT 41.0 08/22/2019 0536   PLT 308 08/22/2019 0536   MCV 94.7 08/22/2019 0536   MCV 91.2 07/04/2014 1102   MCH 30.5 08/22/2019 0536   MCHC 32.2 08/22/2019 0536   RDW 12.6 08/22/2019 0536   LYMPHSABS 1.6 03/16/2017 1727   MONOABS 0.5 03/16/2017 1727   EOSABS 0.2 03/16/2017 1727   BASOSABS 0.0 03/16/2017 1727    BMET    Component Value Date/Time   NA 137 08/22/2019 0536   K 4.3 08/22/2019 0536  CL 105 08/22/2019 0536   CO2 24 08/22/2019 0536   GLUCOSE 96 08/22/2019 0536   BUN 7 08/22/2019 0536   CREATININE 0.72 08/22/2019 0536   CREATININE 0.78 07/04/2014 1053   CALCIUM 9.2 08/22/2019 0536   GFRNONAA >60 08/22/2019 0536   GFRNONAA >89 05/22/2014 1222   GFRAA >60 08/22/2019 0536   GFRAA >89 05/22/2014 1222    BNP No results found for: BNP  ProBNP No results found for: PROBNP  Imaging: DG Chest 2 View  Result Date: 11/30/2019 CLINICAL DATA:  Cough and sore throat. EXAM: CHEST - 2 VIEW COMPARISON:  Radiograph 11/17/2018 FINDINGS: The cardiomediastinal contours are normal. The lungs are clear. Pulmonary vasculature is normal. No consolidation, pleural effusion, or pneumothorax. No acute osseous abnormalities are seen. IMPRESSION: No acute chest findings. Electronically Signed   By: Keith Rake M.D.   On: 11/30/2019 16:33     Assessment & Plan:   Sinobronchitis - Patient has had sinusitis symptoms for 2 weeks with associated cough/wheezing. CXR on 8/28 showed clear lungs, COVID negative  - Continue Augmentin 1 tab twice daily until complete  - Continue Flonase nasal spray 1 puff per nostril daily - Start prednisone 20mg  x 5 days; 10mg  x 5 days; 5mg  x 5 days; stop  - Use Albuterol 2 puffs every 6 hours for shortness of breath or wheezing  Orders: - Pulmonary function testing or Spirometry    Martyn Ehrich, NP 12/02/2019

## 2019-12-13 ENCOUNTER — Telehealth: Payer: Self-pay | Admitting: Internal Medicine

## 2019-12-13 NOTE — Telephone Encounter (Signed)
Patient needs letter stating that second hand smoke is causing her asthma to flair, it has gotten worse need to move to a new apartment building.  Apartment complex will not let her out of her lease otherwise.  She goes to court on 9.17.21 needs the letter for the judge

## 2019-12-13 NOTE — Telephone Encounter (Signed)
Sent to Dr. John. 

## 2019-12-13 NOTE — Telephone Encounter (Signed)
I decline as I am not able to prove this or say this in court

## 2019-12-24 ENCOUNTER — Telehealth: Payer: Self-pay | Admitting: Pulmonary Disease

## 2019-12-24 NOTE — Telephone Encounter (Addendum)
Spoke with patient regarding prior message. Patient stated  She got a new mask on Thursday from DME [employer]. Patient stated she work with a dry nose and mouth.  CPAP Settings: Auto 5-15, needs SD card, no Internet  Dr.Sood can you please advise.  Thank you

## 2019-12-25 NOTE — Telephone Encounter (Signed)
Please let her know she can try increasing the temperature setting on her CPAP humidifier.  If she is having trouble with air leaking from her mask, then please arrange for her DME to refit her CPAP mask.

## 2019-12-25 NOTE — Telephone Encounter (Signed)
Called and spoke with pt letting her know the info stated by VS and she verbalized understanding. I provided pt the phone number for [employer] that pts contact. Nothing further needed.

## 2020-01-02 ENCOUNTER — Telehealth: Payer: Self-pay | Admitting: Pulmonary Disease

## 2020-01-02 NOTE — Telephone Encounter (Signed)
Will call tomorrow since msg states pt can not be reached at this time

## 2020-01-03 NOTE — Telephone Encounter (Addendum)
Tried calling the pt, no answer and her vm was full so not able to leave msg

## 2020-01-07 NOTE — Telephone Encounter (Signed)
Tried calling pt again and there was no answer and no option to leave msg  Will close encounter per protocol

## 2020-01-09 ENCOUNTER — Telehealth: Payer: Self-pay | Admitting: Pulmonary Disease

## 2020-01-09 DIAGNOSIS — G4733 Obstructive sleep apnea (adult) (pediatric): Secondary | ICD-10-CM

## 2020-01-09 NOTE — Telephone Encounter (Signed)
Spoke with pt. She states that she is having trouble with feeling very tired again like she did before she started CPAP. Her numbers have been down lately and she cant figure out why. She had met with [employer] and changed to a nasal mask but it didn't work for her so she went back to old mask because she was getting better readings on that mask. She wakes up in the morning sometimes and her machine is off and she doesn't remember turning it off. She states that she doesn't think the setting ever moves off of 5. Please advise.

## 2020-01-09 NOTE — Telephone Encounter (Signed)
Download has been obtained. Download has been placed up in VS's office. He will be in clinic tomorrow 10/8 so this can be reviewed then. Routing encounter back to VS.

## 2020-01-09 NOTE — Telephone Encounter (Signed)
Please get a copy of her last 30 days CPAP download.

## 2020-01-10 NOTE — Telephone Encounter (Signed)
Auto CPAP 12/19/19 to 01/08/20 >> used on 21 of 21 nights with average 5 hrs 51 min.  Average AHI 4.1 with median CPAP 7 and 95 th percentile CPAP 10 cm H2O.    Please let her know I reviewed her download.  Her auto CPAP is adjusting pressure between 7 and 10 cm H2O, and within this range her sleep apnea is well controlled.  It does appear that she uses CPAP for 6 to 8 hours on some nights, but only 1 to 2 hours on other nights.  If her machine is shutting off by itself, then this could explain why she is feeling more tired during the day.     Please send an order to her DME to have them check the function of her CPAP machine.

## 2020-01-10 NOTE — Telephone Encounter (Signed)
LMTCB

## 2020-01-13 ENCOUNTER — Telehealth: Payer: Self-pay | Admitting: Pulmonary Disease

## 2020-01-13 DIAGNOSIS — G4733 Obstructive sleep apnea (adult) (pediatric): Secondary | ICD-10-CM

## 2020-01-13 NOTE — Telephone Encounter (Signed)
Spoke with Kellie Curtis. She is aware of Dr. Juanetta Gosling response. Order has been placed for DME to service Kellie Curtis's CPAP machine. Nothing further was needed at this time.

## 2020-01-14 ENCOUNTER — Encounter (HOSPITAL_COMMUNITY): Payer: Self-pay

## 2020-01-14 ENCOUNTER — Emergency Department (HOSPITAL_COMMUNITY)
Admission: EM | Admit: 2020-01-14 | Discharge: 2020-01-14 | Disposition: A | Discharge status: Home / Self Care | Payer: No Typology Code available for payment source | Attending: Emergency Medicine | Admitting: Emergency Medicine

## 2020-01-14 ENCOUNTER — Other Ambulatory Visit: Payer: Self-pay

## 2020-01-14 DIAGNOSIS — R42 Dizziness and giddiness: Secondary | ICD-10-CM | POA: Diagnosis present

## 2020-01-14 DIAGNOSIS — J45909 Unspecified asthma, uncomplicated: Secondary | ICD-10-CM | POA: Diagnosis not present

## 2020-01-14 DIAGNOSIS — R519 Headache, unspecified: Secondary | ICD-10-CM | POA: Diagnosis not present

## 2020-01-14 DIAGNOSIS — Z91040 Latex allergy status: Secondary | ICD-10-CM | POA: Insufficient documentation

## 2020-01-14 NOTE — Telephone Encounter (Signed)
LMTCB for pt 

## 2020-01-14 NOTE — ED Triage Notes (Signed)
Pt presents with c/o dizziness and headache. Pt reports dizziness since this morning. Pt reports this is a workers comp situation as a kid came onto her bus that smelled like weed. Pt reports that the smell triggered her asthma and which caused these symptoms.

## 2020-01-14 NOTE — ED Provider Notes (Signed)
[facility] DEPT Provider Note   CSN: [id number] Arrival date & time: 01/14/20  1218     History Chief Complaint  Patient presents with  . Headache  . Dizziness    Kellie Curtis is a 49 y.o. female.  HPI   Patient with significant medical history of asthma, anxiety, ADHD, presents to the emergency department with chief complaint of asthma exacerbation.  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  She states she started become short of breath and started become lightheaded.  She then used her albuterol inhaler which seemed to helped but she still felt lightheaded.  She then contacted EMS who came out and evaluate her.  she was given a neb treatment and felt much better.  She wanted to come heret to be further assessed.  Patient states she is currently not experiencing shortness of breath, no chest pain, denies feeling dizzy at this time.  She explains she has albuterol which she has been taking and seems to help with her symptoms, she states she never been hospitalized due to asthma.  Patient denies headache, fever, chills, shortness breath, chest pain, vomiting, nausea, vomiting, diarrhea.  Past Medical History:  Diagnosis Date  . Abdominal pain, left lower quadrant   . ADHD (attention deficit hyperactivity disorder)   . Allergic rhinitis   . Anemia   . Anxiety state, unspecified   . Asthma    Controlled  . Depression   . Dyslexia   . Dysmenorrhea   . Elevated blood pressure reading without diagnosis of hypertension   . GERD (gastroesophageal reflux disease)   . History of adenomatous polyp of colon    tubular adenoma's 2012;  2017  . Hyperlipidemia   . IBS (irritable bowel syndrome)   . Menorrhagia   . Other convulsions   . Seizure disorder ([place])   . Uterus, adenomyosis     Patient Active Problem List   Diagnosis Date Noted  . Sinobronchitis  12/02/2019  . OSA (obstructive sleep apnea) 11/27/2019  . Morbid obesity ([place]) 11/27/2019  . Rectal bleeding 08/21/2019  . Daytime somnolence 08/26/2017  . Acute upper respiratory infection 03/16/2017  . Urinary incontinence 03/16/2017  . Influenza 06/24/2016  . Cough 04/13/2016  . Lymphadenitis 03/01/2016  . S/P laparoscopic assisted vaginal hysterectomy (LAVH) 02/01/2016  . Perimenopause 06/06/2014  . Thickened endometrium 06/06/2014  . Low grade squamous intraepithelial lesion (LGSIL) on Papanicolaou smear of cervix 05/25/2014  . Hemoptysis 05/10/2013  . Chest pain 01/11/2013  . Anemia, unspecified 10/04/2012  . Allergic rhinitis 03/10/2012  . Irregular menses 05/30/2011  . Irritable bowel syndrome with constipation 05/30/2011  . Encounter for well adult exam with abnormal findings 05/28/2011  . GERD 05/25/2010  . Diaphragmatic hernia 05/21/2010  . COLONIC POLYPS, HX OF 05/21/2010  . Constipation 04/27/2010  . Wheezing 12/16/2008  . Anxiety 03/14/2008  . Depression 03/14/2008  . Attention deficit hyperactivity disorder (ADHD) 03/14/2008  . SEIZURE DISORDER 02/13/2008  . HYPERLIPIDEMIA 02/07/2008  . BACK PAIN 02/07/2008    Past Surgical History:  Procedure Laterality Date  . CESAREAN SECTION  x2  last one 02-22-2005   Bilateral Tubal Ligation w/ last one  . COLONOSCOPY  last one 11-27-2015  . LAPAROSCOPIC VAGINAL HYSTERECTOMY WITH SALPINGECTOMY Bilateral 02/01/2016   Procedure: LAPAROSCOPIC ASSISTED VAGINAL HYSTERECTOMY WITH SALPINGECTOMY;  Surgeon: Arvella Nigh, MD;  Location: [facility];  Service: Gynecology;  Laterality: Bilateral;  . TONSILLECTOMY       OB History    Gravida  3   Para  3   Term      Preterm      AB      Living  3     SAB      TAB      Ectopic      Multiple      Live Births              Family History  Problem Relation Age of Onset  . Stroke Father   . Hypertension Father   . Gout Father   .  Hyperlipidemia Father   . Colon cancer Father   . Asthma Mother   . Alcohol abuse Other        grandfather  . Breast cancer Other        grandmother  . Hypertension Other        grandmother  . Asthma Other        grandmother  . Breast cancer Maternal Grandmother   . Esophageal cancer Neg Hx   . Stomach cancer Neg Hx   . Rectal cancer Neg Hx     Social History   Tobacco Use  . Smoking status: Never Smoker  . Smokeless tobacco: Never Used  Substance Use Topics  . Alcohol use: Yes    Alcohol/week: 2.0 standard drinks    Types: 2 Cans of beer per week  . Drug use: No    Home Medications Prior to Admission medications   Medication Sig Start Date End Date Taking? Authorizing Provider  ACAI BERRY PO Take 1 tablet by mouth daily.     [provider]  albuterol (VENTOLIN HFA) 108 (90 Base) MCG/ACT inhaler Inhale 2 puffs into the lungs every 4 (four) hours as needed for wheezing or shortness of breath.    [provider]  amphetamine-dextroamphetamine (ADDERALL) 5 MG tablet Take 1 tablet (5 mg total) by mouth daily as needed. Patient taking differently: Take 5 mg by mouth daily as needed (attention deficit).  08/14/19   Biagio Borg, MD  Ascorbic Acid (VITAMIN C) 100 MG tablet Take 100 mg by mouth daily.    [provider]  Cyanocobalamin (VITAMIN B 12 PO) Take 1 tablet by mouth daily.     [provider]  fluticasone (FLONASE) 50 MCG/ACT nasal spray Place 2 sprays into both nostrils daily for 14 days. 11/30/19 12/14/19  Tedd Sias, PA  linaclotide (LINZESS) 72 MCG capsule Take 1 capsule (72 mcg total) by mouth daily as needed. 08/14/19   Biagio Borg, MD  methylcellulose (CITRUCEL) oral powder Take 2 tablespoons in 8oz of water every day. 08/22/19   Cherene Altes, MD  methylphenidate (METADATE CD) 40 MG CR capsule Take 1 capsule (40 mg total) by mouth every morning. Patient taking differently: Take 40 mg by mouth daily as needed (attention  deficit).  08/14/19   Biagio Borg, MD  Multiple Vitamins-Minerals (ALIVE WOMENS GUMMY PO) Take 1 tablet by mouth daily.    [provider]  pantoprazole (PROTONIX) 40 MG tablet Take 1 tablet (40 mg total) by mouth daily. 08/14/19   Biagio Borg, MD  polyethylene glycol (MIRALAX / GLYCOLAX) 17 g packet Take 17 g by mouth daily.    [provider]  predniSONE (DELTASONE) 10 MG tablet 2 tablets daily x 5 days; then 1 tablet x 5 days; 1/2 tab x 5  days; then stop 12/02/19   Martyn Ehrich, NP  promethazine-dextromethorphan (PROMETHAZINE-DM) 6.25-15 MG/5ML syrup Take 5 mLs by mouth 4 (four) times daily as needed for cough. 11/30/19   Tedd Sias, PA  valACYclovir (VALTREX) 500 MG tablet Take 1 tablet by mouth twice daily 08/19/19   Biagio Borg, MD  VITAMIN D PO Take 1 capsule by mouth daily.    [provider]    Allergies    Latex, Adhesive [tape], and Hydrocodone-homatropine  Review of Systems   Review of Systems  Constitutional: Negative for chills and fever.  HENT: Negative for congestion, tinnitus, trouble swallowing and voice change.   Eyes: Negative for visual disturbance.  Respiratory: Negative for cough and shortness of breath.   Cardiovascular: Negative for chest pain.  Gastrointestinal: Negative for abdominal pain, diarrhea, nausea and vomiting.  Genitourinary: Negative for enuresis, flank pain, frequency and genital sores.  Musculoskeletal: Negative for back pain.  Skin: Negative for rash.  Neurological: Negative for dizziness and headaches.  Hematological: Does not bruise/bleed easily.    Physical Exam Updated Vital Signs BP 121/77 (BP Location: Left Arm)   Pulse 76   Temp 98 F (36.7 C) (Oral)   Resp 18   LMP 01/20/2016   SpO2 95%   Physical Exam Vitals and nursing note reviewed.  Constitutional:      General: She is not in acute distress.    Appearance: She is not ill-appearing.  HENT:     Head: Normocephalic and atraumatic.      Nose: No congestion.     Mouth/Throat:     Mouth: Mucous membranes are moist.     Pharynx: Oropharynx is clear. No oropharyngeal exudate or posterior oropharyngeal erythema.  Eyes:     General: No scleral icterus. Cardiovascular:     Rate and Rhythm: Normal rate and regular rhythm.     Pulses: Normal pulses.     Heart sounds: No murmur heard.  No friction rub. No gallop.   Pulmonary:     Effort: No respiratory distress.     Breath sounds: No wheezing, rhonchi or rales.  Abdominal:     General: There is no distension.     Tenderness: There is no abdominal tenderness. There is no right CVA tenderness, left CVA tenderness or guarding.  Musculoskeletal:        General: No swelling.     Right lower leg: No edema.     Left lower leg: No edema.  Skin:    General: Skin is warm and dry.     Findings: No rash.  Neurological:     Mental Status: She is alert.  Psychiatric:        Mood and Affect: Mood normal.     ED Results / Procedures / Treatments   Labs (all labs ordered are listed, but only abnormal results are displayed) Labs Reviewed - No data to display  EKG None  Radiology No results found.  Procedures Procedures (including critical care time)  Medications Ordered in ED Medications - No data to display  ED Course  I have reviewed the triage vital signs and the nursing notes.  Pertinent labs & imaging results that were available during my care of the patient were reviewed by me and considered in my medical decision making (see chart for details).    MDM Rules/Calculators/A&P                          I  have personally reviewed all imaging, labs and have interpreted them.  Patient presents with chief complaint of asthma exacerbation.  She was alert, did not appear in acute distress, vital signs reassuring.  I have low suspicion for acute asthma exacerbation as lung sounds are clear bilaterally, no wheezing or stridor heard.  She had good air movement, no signs  of respiratory distress.  Low suspicion for systemic infection as patient was nontoxic-appearing, vital signs reassuring, no obvious source infection on exam.  Low suspicion for ACS as patient denies chest pain, shortness of breath occurred right after she smelled marijuana, no signs of hypoperfusion or fluid overload noted on exam, patient has little cardiac risk factors.  Low suspicion for PE as patient denies pleuritic chest pain, patient explains shortness of breath happened immediately after smelling marijuana.  She denies leg pain or leg swelling, on exam there is no pedal edema noted.  I suspect patient's asthma exacerbation is multifactorial.  I suspect she may have had a slight exacerbation from the marijuana but I suspect that patient has anxiety which plays a large part in this.  I recommend that she continues with her albuterol inhaler and follow-up with her PCP for further evaluation.  Patient vital signs remained stable, no indication for hospital admission.  Patient given at home care and strict return precautions.  Patient relates he understood and agreed to the plan. Final Clinical Impression(s) / ED Diagnoses Final diagnoses:  Lightheadedness    Rx / DC Orders ED Discharge Orders    None       Marcello Fennel, PA-C 01/14/20 1544    Nat Christen, MD 01/15/20 [phone]

## 2020-01-14 NOTE — ED Notes (Signed)
An After Visit Summary was printed and given to the patient. Discharge instructions given and no further questions at this time.  

## 2020-01-14 NOTE — Discharge Instructions (Signed)
You have been seen here for asthma.  Exam looks reassuring.  I recommend taking your rescue inhaler as needed.  Please continue to take all your medications as prescribed.  You can follow-up with your PCP in 2 weeks time if needed.  Come back to emergency department if develop chest pain, shortness of breath, severe abdominal pain, uncontrolled nausea, vomiting, diarrhea.

## 2020-01-15 NOTE — Telephone Encounter (Signed)
Will try calling her again tomorrow since the time is now 1:06 PM

## 2020-01-15 NOTE — Telephone Encounter (Signed)
Pt is calling back - she can only answer the phone between 9:30 - 12:00 - or after 6:00 pm. - CB# [phone]

## 2020-01-16 NOTE — Telephone Encounter (Signed)
Okay to change auto CPAP to 7 to 15 cm H2O.

## 2020-01-16 NOTE — Telephone Encounter (Signed)
LMTCB x1 for pt.  

## 2020-01-16 NOTE — Telephone Encounter (Addendum)
Dr. Juanetta Gosling recs from previous phone note are below. Called and spoke to pt. Pt states she doesn't feel any pressure when she puts her mask on and wishes to change the initial pressure to 7 instead of 5. Pt states it takes her more than an hour to fall asleep each night with the help of melatonin. Pt was vague when asked what keeps her from falling asleep. Pt was reluctant to believe her apneas are being controlled at the current pressure setting.   Dr. Halford Chessman please advise if you are ok changing her pressure to begin at 7. Last order to DME in 11/2019 was auto 5-15.      "Auto CPAP 12/19/19 to 01/08/20 >> used on 21 of 21 nights with average 5 hrs 51 min.  Average AHI 4.1 with median CPAP 7 and 95 th percentile CPAP 10 cm H2O.  Please let her know I reviewed her download.  Her auto CPAP is adjusting pressure between 7 and 10 cm H2O, and within this range her sleep apnea is well controlled.  It does appear that she uses CPAP for 6 to 8 hours on some nights, but only 1 to 2 hours on other nights.  If her machine is shutting off by itself, then this could explain why she is feeling more tired during the day.     Please send an order to her DME to have them check the function of her CPAP machine."

## 2020-01-21 NOTE — Telephone Encounter (Signed)
Called and left detailed message for pt about pressure change. Order placed to [employer]. Instructed pt to call back with issues or if she hasnt heard from DME. Will sign off.

## 2020-03-06 ENCOUNTER — Other Ambulatory Visit: Payer: Self-pay

## 2020-03-06 ENCOUNTER — Ambulatory Visit: Payer: BC Managed Care – PPO | Admitting: Adult Health

## 2020-03-06 ENCOUNTER — Encounter: Payer: Self-pay | Admitting: Adult Health

## 2020-03-06 DIAGNOSIS — G4733 Obstructive sleep apnea (adult) (pediatric): Secondary | ICD-10-CM

## 2020-03-06 NOTE — Assessment & Plan Note (Signed)
Patient is starting off well on CPAP. She has excellent control and compliance on nocturnal CPAP  Plan  Patient Instructions  Continue on CPAP at bedtime Goal is to wear CPAP for at least 4 hours or more each night Work on healthy weight loss Do not drive if sleepy Follow-up in 4-6 months with Dr. Halford Chessman or Yannely Kintzel NP and As needed

## 2020-03-06 NOTE — Progress Notes (Signed)
Reviewed and agree with assessment/plan.   Chesley Mires, MD [facility]/Critical [facility] 03/06/2020, 12:59 PM Pager:  [phone]

## 2020-03-06 NOTE — Assessment & Plan Note (Signed)
Healthy weight loss discussed 

## 2020-03-06 NOTE — Patient Instructions (Signed)
Continue on CPAP at bedtime Goal is to wear CPAP for at least 4 hours or more each night Work on healthy weight loss Do not drive if sleepy Follow-up in 4-6 months with Dr. Halford Chessman or Sherea Liptak NP and As needed

## 2020-03-06 NOTE — Progress Notes (Signed)
@Patient  ID: Kellie Curtis, female    DOB: 1970/06/10, 49 y.o.   MRN: [id number]  Chief Complaint  Patient presents with  . Follow-up    OSA     Referring provider: Biagio Borg, MD  HPI: 49 yo female seen for sleep consult on 10/16/19 for snoring , daytime sleepiness and snoring found to have a mild sleep apnea on home sleep study.  Medical history significant for ADHD on Adderrall and Ritalin-managed by PCP  TEST/EVENTS :  HST 11/01/19 >> AHI 13.6, SpO2 low 75%  03/06/2020 Follow up : OSA  Patient presents for a follow-up for obstructive sleep apnea.  Patient is recently started on CPAP.  Patient says that she is doing okay on CPAP.  Has had some trouble with her machine.  And is taking it back to the homecare company.  He has had episodes where it turns on and off.  Patient says that she has not noticed a whole lot of difference with her daytime sleepiness.  However she does not wake up as much at nighttime.  Patient's CPAP download shows excellent compliance at 93% usage.  Daily average usage around 6 hours.  Patient is on auto CPAP 7 to 15 cm H2O.  AHI 3.4. She is currently using a full face mask.  Does have attention deficit disorder and is on methylphenidate and Adderall managed by her primary care provider.   Allergies  Allergen Reactions  . Latex Rash  . Adhesive [Tape] Rash  . Hydrocodone-Homatropine Itching    Immunization History  Administered Date(s) Administered  . Influenza,inj,Quad PF,6+ Mos 03/16/2017  . PFIZER SARS-COV-2 Vaccination 07/06/2019, 07/27/2019    Past Medical History:  Diagnosis Date  . Abdominal pain, left lower quadrant   . ADHD (attention deficit hyperactivity disorder)   . Allergic rhinitis   . Anemia   . Anxiety state, unspecified   . Asthma    Controlled  . Depression   . Dyslexia   . Dysmenorrhea   . Elevated blood pressure reading without diagnosis of hypertension   . GERD (gastroesophageal reflux disease)   . History of  adenomatous polyp of colon    tubular adenoma's 2012;  2017  . Hyperlipidemia   . IBS (irritable bowel syndrome)   . Menorrhagia   . Other convulsions   . Seizure disorder ([place])   . Uterus, adenomyosis     Tobacco History: Social History   Tobacco Use  Smoking Status Never Smoker  Smokeless Tobacco Never Used   Counseling given: Not Answered   Outpatient Medications Prior to Visit  Medication Sig Dispense Refill  . ACAI BERRY PO Take 1 tablet by mouth daily.     Marland Kitchen albuterol (VENTOLIN HFA) 108 (90 Base) MCG/ACT inhaler Inhale 2 puffs into the lungs every 4 (four) hours as needed for wheezing or shortness of breath.    . amphetamine-dextroamphetamine (ADDERALL) 5 MG tablet Take 1 tablet (5 mg total) by mouth daily as needed. (Patient taking differently: Take 5 mg by mouth daily as needed (attention deficit). ) 30 tablet 0  . Ascorbic Acid (VITAMIN C) 100 MG tablet Take 100 mg by mouth daily.    . Cyanocobalamin (VITAMIN B 12 PO) Take 1 tablet by mouth daily.     . fluticasone (FLONASE) 50 MCG/ACT nasal spray Place 2 sprays into both nostrils daily for 14 days. 11.1 mL 0  . linaclotide (LINZESS) 72 MCG capsule Take 1 capsule (72 mcg total) by mouth daily as needed. 30 capsule 5  .  methylcellulose (CITRUCEL) oral powder Take 2 tablespoons in 8oz of water every day.    . methylphenidate (METADATE CD) 40 MG CR capsule Take 1 capsule (40 mg total) by mouth every morning. (Patient taking differently: Take 40 mg by mouth daily as needed (attention deficit). ) 30 capsule 0  . Multiple Vitamins-Minerals (ALIVE WOMENS GUMMY PO) Take 1 tablet by mouth daily.    . pantoprazole (PROTONIX) 40 MG tablet Take 1 tablet (40 mg total) by mouth daily. 90 tablet 3  . polyethylene glycol (MIRALAX / GLYCOLAX) 17 g packet Take 17 g by mouth daily.    . predniSONE (DELTASONE) 10 MG tablet 2 tablets daily x 5 days; then 1 tablet x 5 days; 1/2 tab x 5 days; then stop 20 tablet 0  .  promethazine-dextromethorphan (PROMETHAZINE-DM) 6.25-15 MG/5ML syrup Take 5 mLs by mouth 4 (four) times daily as needed for cough. 118 mL 0  . valACYclovir (VALTREX) 500 MG tablet Take 1 tablet by mouth twice daily 30 tablet 0  . VITAMIN D PO Take 1 capsule by mouth daily.     No facility-administered medications prior to visit.     Review of Systems:   Constitutional:   No  weight loss, night sweats,  Fevers, chills, fatigue, or  lassitude.  HEENT:   No headaches,  Difficulty swallowing,  Tooth/dental problems, or  Sore throat,                No sneezing, itching, ear ache, nasal congestion, post nasal drip,   CV:  No chest pain,  Orthopnea, PND, swelling in lower extremities, anasarca, dizziness, palpitations, syncope.   GI  No heartburn, indigestion, abdominal pain, nausea, vomiting, diarrhea, change in bowel habits, loss of appetite, bloody stools.   Resp: No shortness of breath with exertion or at rest.  No excess mucus, no productive cough,  No non-productive cough,  No coughing up of blood.  No change in color of mucus.  No wheezing.  No chest wall deformity  Skin: no rash or lesions.  GU: no dysuria, change in color of urine, no urgency or frequency.  No flank pain, no hematuria   MS:  No joint pain or swelling.  No decreased range of motion.  No back pain.    Physical Exam  BP 100/60 (BP Location: Left Arm, Patient Position: Sitting, Cuff Size: Normal)   Pulse 80   Temp 98.2 F (36.8 C) (Temporal)   Ht 5\' 5"  (1.651 m)   Wt 204 lb 9.6 oz (92.8 kg)   LMP 01/20/2016   SpO2 97%   BMI 34.05 kg/m   GEN: A/Ox3; pleasant , NAD, well nourished    HEENT:  [place]/AT,   NOSE-clear, THROAT-clear, no lesions, no postnasal drip or exudate noted.   NECK:  Supple w/ fair ROM; no JVD; normal carotid impulses w/o bruits; no thyromegaly or nodules palpated; no lymphadenopathy.    RESP  Clear  P & A; w/o, wheezes/ rales/ or rhonchi. no accessory muscle use, no dullness to  percussion  CARD:  RRR, no m/r/g, no peripheral edema, pulses intact, no cyanosis or clubbing.  GI:   Soft & nt; nml bowel sounds; no organomegaly or masses detected.   Musco: Warm bil, no deformities or joint swelling noted.   Neuro: alert, no focal deficits noted.    Skin: Warm, no lesions or rashes    Lab Results:  CBC    Component Value Date/Time   WBC 4.0 08/22/2019 0536   RBC  4.33 08/22/2019 0536   HGB 13.2 08/22/2019 0536   HCT 41.0 08/22/2019 0536   PLT 308 08/22/2019 0536   MCV 94.7 08/22/2019 0536   MCV 91.2 07/04/2014 1102   MCH 30.5 08/22/2019 0536   MCHC 32.2 08/22/2019 0536   RDW 12.6 08/22/2019 0536   LYMPHSABS 1.6 03/16/2017 1727   MONOABS 0.5 03/16/2017 1727   EOSABS 0.2 03/16/2017 1727   BASOSABS 0.0 03/16/2017 1727    BMET    Component Value Date/Time   NA 137 08/22/2019 0536   K 4.3 08/22/2019 0536   CL 105 08/22/2019 0536   CO2 24 08/22/2019 0536   GLUCOSE 96 08/22/2019 0536   BUN 7 08/22/2019 0536   CREATININE 0.72 08/22/2019 0536   CREATININE 0.78 07/04/2014 1053   CALCIUM 9.2 08/22/2019 0536   GFRNONAA >60 08/22/2019 0536   GFRNONAA >89 05/22/2014 1222   GFRAA >60 08/22/2019 0536   GFRAA >89 05/22/2014 1222    BNP No results found for: BNP  ProBNP No results found for: PROBNP  Imaging: No results found.    No flowsheet data found.  No results found for: NITRICOXIDE      Assessment & Plan:   OSA (obstructive sleep apnea) Patient is starting off well on CPAP. She has excellent control and compliance on nocturnal CPAP  Plan  Patient Instructions  Continue on CPAP at bedtime Goal is to wear CPAP for at least 4 hours or more each night Work on healthy weight loss Do not drive if sleepy Follow-up in 4-6 months with Dr. Halford Chessman or Meklit Cotta NP and As needed        Morbid obesity ([place]) Healthy weight loss discussed     Rexene Edison, NP 03/06/2020

## 2020-04-09 ENCOUNTER — Telehealth: Payer: Self-pay | Admitting: Pulmonary Disease

## 2020-04-09 NOTE — Telephone Encounter (Signed)
04/09/2020  Sorry to hear the patient is not feeling well.  I am glad to hear that she is vaccinated.  I am unable to provide additional recommendations regarding a medication that I did not prescribe as well as we are unsure exactly what the antibiotic is.  I would recommend that she contact [facility] regarding this information.  I would assume that potentially they treated her with an antibiotic out of concerns for bronchitis.  This is not clear as I do not have these records.  If she is not having any wheezing, chest tightness or shortness of breath and is simply having fatigue, headaches and fever she should continue to monitor her symptoms.  I am unsure what patient means regarding her CPAP.  If she simply means because she is positive for COVID-19 she still can safely use her CPAP and I would recommend this considering the fact that she has known obstructive sleep apnea.  If she refused to use her CPAP then I would recommend she avoid supine sleep and sleep in a recliner if she is able.  Patient needs to continue to monitor her symptoms and ensure her oxygen levels are remaining above 90% on room air.  Patient also needs to be monitoring for fever and keeping fever below 100.5.  She can use over-the-counter options such as ibuprofen or Tylenol.  If symptoms worsen such as persistent fever, worsened oxygen levels, sudden chest pain, shortness of breath she needs to seek emergent evaluation in the ED.  Patient should continue to quarantine for the next 10 days.  She should also notify anyone who she has been around in the last 2 to 5 days as they should be monitoring for symptoms as a potentially may have been exposed to COVID-19 as well.  Would recommend patient have a follow-up with our practice in the next 2 to 4 weeks in office to monitor for symptoms.  Could consider chest x-ray at that point in time.  Elisha Headland, FNP

## 2020-04-09 NOTE — Telephone Encounter (Signed)
Spoke with the pt and notified of response per Arlys John  She verbalized understanding  Referral to infusion clinic also made by Elisha Headland and pt aware they will be calling her  F/u scheduled with TP for 04/27/20

## 2020-04-09 NOTE — Telephone Encounter (Signed)
Attempted to call pt but unable to reach. Left message for her to return call. 

## 2020-04-09 NOTE — Telephone Encounter (Signed)
Spoke with the pt  She states dx with covid today  She woke up today with HA, aches, nausea, fever  She had virtual visit today and states that they prescribed abs but not sure the name  She states they asked her if she was allergic to PCN  She states not having any wheezing, tightness, SOB  She does not have a way to check her sats  Vaxxed x 2- no booster  She states afraid to use her CPAP "b/c it's contaminated"

## 2020-04-27 ENCOUNTER — Ambulatory Visit: Payer: BC Managed Care – PPO | Admitting: Adult Health

## 2020-05-04 ENCOUNTER — Other Ambulatory Visit: Payer: Self-pay

## 2020-05-04 ENCOUNTER — Ambulatory Visit (INDEPENDENT_AMBULATORY_CARE_PROVIDER_SITE_OTHER): Payer: Self-pay | Admitting: Adult Health

## 2020-05-04 ENCOUNTER — Encounter: Payer: Self-pay | Admitting: Adult Health

## 2020-05-04 DIAGNOSIS — G4733 Obstructive sleep apnea (adult) (pediatric): Secondary | ICD-10-CM

## 2020-05-04 DIAGNOSIS — J069 Acute upper respiratory infection, unspecified: Secondary | ICD-10-CM

## 2020-05-04 MED ORDER — BENZONATATE 200 MG PO CAPS: 200.0000 mg | ORAL_CAPSULE | Freq: Three times a day (TID) | ORAL | 0 refills | Status: AC | PRN

## 2020-05-04 NOTE — Progress Notes (Signed)
@Patient  ID: Kellie Curtis, female    DOB: Dec 12, 1970, 50 y.o.   MRN: [id number]  Chief Complaint  Patient presents with  . Sleep Apnea    Referring provider: Biagio Borg, MD  HPI: 50 year old female seen for sleep consult October 16, 2019 for snoring daytime sleepiness and snoring found to have mild sleep apnea on home sleep study Medical history significant for ADHD on Adderall and Ritalin managed by primary care provider  TEST/EVENTS :  HST 11/01/19 >> AHI 13.6, SpO2 low 75%  05/04/2020 Follow up ; OSA , Covid 19  Returns for follow up for OSA. Says she is doing better on CPAP . Missed couple of nights due to illness and holidays other than that has been wearing each night .  Does feel like mask moves at night sometimes.  CPAP download shows 87% compliance with daily average use is at 5 hours.  Patient is on auto CPAP 7 to 15 cm H2O.  AHI 3.2. Wants all new CPAP supplies since having Covid 19 earlier this month. Order sent to DME .   She is recovering from Covid , feeling better, back to work. Has some lingering cough , minimal congestion . No fever or chest pain.    Allergies  Allergen Reactions  . Latex Rash  . Adhesive [Tape] Rash  . Hydrocodone-Homatropine Itching    Immunization History  Administered Date(s) Administered  . Influenza,inj,Quad PF,6+ Mos 03/16/2017  . PFIZER([place])SARS-COV-2 Vaccination 07/06/2019, 07/27/2019    Past Medical History:  Diagnosis Date  . Abdominal pain, left lower quadrant   . ADHD (attention deficit hyperactivity disorder)   . Allergic rhinitis   . Anemia   . Anxiety state, unspecified   . Asthma    Controlled  . Depression   . Dyslexia   . Dysmenorrhea   . Elevated blood pressure reading without diagnosis of hypertension   . GERD (gastroesophageal reflux disease)   . History of adenomatous polyp of colon    tubular adenoma's 2012;  2017  . Hyperlipidemia   . IBS (irritable bowel syndrome)   . Menorrhagia   . Other  convulsions   . Seizure disorder ([place])   . Uterus, adenomyosis     Tobacco History: Social History   Tobacco Use  Smoking Status Never Smoker  Smokeless Tobacco Never Used   Counseling given: Not Answered   Outpatient Medications Prior to Visit  Medication Sig Dispense Refill  . ACAI BERRY PO Take 1 tablet by mouth daily.     Marland Kitchen albuterol (VENTOLIN HFA) 108 (90 Base) MCG/ACT inhaler Inhale 2 puffs into the lungs every 4 (four) hours as needed for wheezing or shortness of breath.    . amphetamine-dextroamphetamine (ADDERALL) 5 MG tablet Take 1 tablet (5 mg total) by mouth daily as needed. (Patient taking differently: Take 5 mg by mouth daily as needed (attention deficit).) 30 tablet 0  . Ascorbic Acid (VITAMIN C) 100 MG tablet Take 100 mg by mouth daily.    . Cyanocobalamin (VITAMIN B 12 PO) Take 1 tablet by mouth daily.     Marland Kitchen linaclotide (LINZESS) 72 MCG capsule Take 1 capsule (72 mcg total) by mouth daily as needed. 30 capsule 5  . methylcellulose (CITRUCEL) oral powder Take 2 tablespoons in 8oz of water every day.    . methylphenidate (METADATE CD) 40 MG CR capsule Take 1 capsule (40 mg total) by mouth every morning. (Patient taking differently: Take 40 mg by mouth daily as needed (attention deficit).) 30  capsule 0  . Multiple Vitamins-Minerals (ALIVE WOMENS GUMMY PO) Take 1 tablet by mouth daily.    . pantoprazole (PROTONIX) 40 MG tablet Take 1 tablet (40 mg total) by mouth daily. 90 tablet 3  . polyethylene glycol (MIRALAX / GLYCOLAX) 17 g packet Take 17 g by mouth daily.    . promethazine-dextromethorphan (PROMETHAZINE-DM) 6.25-15 MG/5ML syrup Take 5 mLs by mouth 4 (four) times daily as needed for cough. 118 mL 0  . valACYclovir (VALTREX) 500 MG tablet Take 1 tablet by mouth twice daily 30 tablet 0  . VITAMIN D PO Take 1 capsule by mouth daily.    . fluticasone (FLONASE) 50 MCG/ACT nasal spray Place 2 sprays into both nostrils daily for 14 days. 11.1 mL 0  . predniSONE (DELTASONE)  10 MG tablet 2 tablets daily x 5 days; then 1 tablet x 5 days; 1/2 tab x 5 days; then stop 20 tablet 0   No facility-administered medications prior to visit.     Review of Systems:   Constitutional:   No  weight loss, night sweats,  Fevers, chills, fatigue, or  lassitude.  HEENT:   No headaches,  Difficulty swallowing,  Tooth/dental problems, or  Sore throat,                No sneezing, itching, ear ache,  +nasal congestion, post nasal drip,   CV:  No chest pain,  Orthopnea, PND, swelling in lower extremities, anasarca, dizziness, palpitations, syncope.   GI  No heartburn, indigestion, abdominal pain, nausea, vomiting, diarrhea, change in bowel habits, loss of appetite, bloody stools.   Resp:   No chest wall deformity  Skin: no rash or lesions.  GU: no dysuria, change in color of urine, no urgency or frequency.  No flank pain, no hematuria   MS:  No joint pain or swelling.  No decreased range of motion.  No back pain.    Physical Exam  BP 124/76   Pulse 80   Temp 97.6 F (36.4 C) (Temporal)   Ht 5\' 5"  (1.651 m)   Wt 205 lb 9.6 oz (93.3 kg)   LMP 01/20/2016   SpO2 97% Comment: on RA  BMI 34.21 kg/m   GEN: A/Ox3; pleasant , NAD, well nourished    HEENT:  [place]/AT,    NOSE-clear, THROAT-clear, no lesions, no postnasal drip or exudate noted.   NECK:  Supple w/ fair ROM; no JVD; normal carotid impulses w/o bruits; no thyromegaly or nodules palpated; no lymphadenopathy.    RESP  Clear  P & A; w/o, wheezes/ rales/ or rhonchi. no accessory muscle use, no dullness to percussion  CARD:  RRR, no m/r/g, no peripheral edema, pulses intact, no cyanosis or clubbing.  GI:   Soft & nt; nml bowel sounds; no organomegaly or masses detected.   Musco: Warm bil, no deformities or joint swelling noted.   Neuro: alert, no focal deficits noted.    Skin: Warm, no lesions or rashes    Lab Results:  CBC  BNP No results found for: BNP  ProBNP No results found for:  PROBNP  Imaging: No results found.    No flowsheet data found.  No results found for: NITRICOXIDE      Assessment & Plan:   OSA (obstructive sleep apnea) Good control and compliance.  Order sent to DME for all new supplies since having COVID-19. Patient education given  Plan  Patient Instructions  Continue on CPAP at bedtime Goal is to wear CPAP for at least  4 hours or more each night Work on healthy weight loss Order for new supplies  Do not drive if sleepy  Delsym 2 tsp Twice daily  For cough as needed.  Tessalon Three times a day  As needed  Cough.   Follow-up in 4-6 months with Dr. Halford Chessman or Manuel Dall NP and As needed        Acute upper respiratory infection Recent COVID-19 infection with post viral cough.  Patient is improving.  Advised to use Delsym and may have some Tessalon Perles for cough control.  If not improving will need further evaluation.     Rexene Edison, NP 05/04/2020

## 2020-05-04 NOTE — Assessment & Plan Note (Signed)
Good control and compliance.  Order sent to DME for all new supplies since having COVID-19. Patient education given  Plan  Patient Instructions  Continue on CPAP at bedtime Goal is to wear CPAP for at least 4 hours or more each night Work on healthy weight loss Order for new supplies  Do not drive if sleepy  Delsym 2 tsp Twice daily  For cough as needed.  Tessalon Three times a day  As needed  Cough.   Follow-up in 4-6 months with Dr. Halford Chessman or Mirely Pangle NP and As needed

## 2020-05-04 NOTE — Progress Notes (Signed)
Reviewed and agree with assessment/plan.   Juventino Pavone, MD [facility] 05/04/2020, 12:15 PM Pager:  [phone]  

## 2020-05-04 NOTE — Assessment & Plan Note (Signed)
Recent COVID-19 infection with post viral cough.  Patient is improving.  Advised to use Delsym and may have some Tessalon Perles for cough control.  If not improving will need further evaluation.

## 2020-05-04 NOTE — Patient Instructions (Addendum)
Continue on CPAP at bedtime Goal is to wear CPAP for at least 4 hours or more each night Work on healthy weight loss Order for new supplies  Do not drive if sleepy  Delsym 2 tsp Twice daily  For cough as needed.  Tessalon Three times a day  As needed  Cough.   Follow-up in 4-6 months with Dr. Halford Chessman or Kyrra Prada NP and As needed

## 2020-12-31 ENCOUNTER — Encounter: Payer: Self-pay | Admitting: Gastroenterology

## 2021-06-24 ENCOUNTER — Other Ambulatory Visit: Payer: Self-pay | Admitting: Endocrinology

## 2021-06-24 DIAGNOSIS — Z1231 Encounter for screening mammogram for malignant neoplasm of breast: Secondary | ICD-10-CM

## 2021-12-09 ENCOUNTER — Ambulatory Visit (INDEPENDENT_AMBULATORY_CARE_PROVIDER_SITE_OTHER): Payer: BC Managed Care – PPO

## 2021-12-09 ENCOUNTER — Ambulatory Visit (HOSPITAL_COMMUNITY)
Admission: EM | Admit: 2021-12-09 | Discharge: 2021-12-09 | Disposition: A | Discharge status: Home / Self Care | Payer: BC Managed Care – PPO | Attending: Family Medicine | Admitting: Family Medicine

## 2021-12-09 DIAGNOSIS — R1031 Right lower quadrant pain: Secondary | ICD-10-CM | POA: Diagnosis not present

## 2021-12-09 DIAGNOSIS — K5900 Constipation, unspecified: Secondary | ICD-10-CM | POA: Diagnosis not present

## 2021-12-09 LAB — POCT URINALYSIS DIPSTICK, ED / UC
Bilirubin Urine: NEGATIVE
Glucose, UA: NEGATIVE mg/dL
Hgb urine dipstick: NEGATIVE
Ketones, ur: NEGATIVE mg/dL
Leukocytes,Ua: NEGATIVE
Nitrite: NEGATIVE
Protein, ur: NEGATIVE mg/dL
Specific Gravity, Urine: 1.025 (ref 1.005–1.030)
Urobilinogen, UA: 0.2 mg/dL (ref 0.0–1.0)
pH: 5 (ref 5.0–8.0)

## 2021-12-09 NOTE — Discharge Instructions (Addendum)
Start using MiraLAX over-the-counter according to the package instructions.  Try using a Dulcolax suppository to get things started.  Let your primary care office know you have been seen for these issues.  Get seen in the emergency room if you worsen in any way

## 2021-12-09 NOTE — ED Provider Notes (Addendum)
[place]    CSN: [id number] Arrival date & time: 12/09/21  0946      History   Chief Complaint Chief Complaint  Patient presents with   Abdominal Pain    HPI Kellie Curtis is a 51 y.o. female.    Abdominal Pain  Here for a 3 to 4-day history of right lower quadrant pain.  She has had absolutely no nausea and no vomiting.  No fever.  Last bowel movement was yesterday.  She has still been passing latest.  She states she does have a history of IBS, and this usually manifest as constipation.  She had a hysterectomy 5 years ago; she is not sure she still has her ovaries  The pain has been fairly constant and has worsened some.  It is worse with bearing weight with lying down it can worsen when she is sitting.  Past Medical History:  Diagnosis Date   Abdominal pain, left lower quadrant    ADHD (attention deficit hyperactivity disorder)    Allergic rhinitis    Anemia    Anxiety state, unspecified    Asthma    Controlled   Depression    Dyslexia    Dysmenorrhea    Elevated blood pressure reading without diagnosis of hypertension    GERD (gastroesophageal reflux disease)    History of adenomatous polyp of colon    tubular adenoma's 2012;  2017   Hyperlipidemia    IBS (irritable bowel syndrome)    Menorrhagia    Other convulsions    Seizure disorder ([place])    Uterus, adenomyosis     Patient Active Problem List   Diagnosis Date Noted   Sinobronchitis 12/02/2019   OSA (obstructive sleep apnea) 11/27/2019   Morbid obesity ([place]) 11/27/2019   Rectal bleeding 08/21/2019   Daytime somnolence 08/26/2017   Acute upper respiratory infection 03/16/2017   Urinary incontinence 03/16/2017   Influenza 06/24/2016   Cough 04/13/2016   Lymphadenitis 03/01/2016   S/P laparoscopic assisted vaginal hysterectomy (LAVH) 02/01/2016   Perimenopause 06/06/2014   Thickened endometrium 06/06/2014   Low grade squamous intraepithelial lesion (LGSIL) on Papanicolaou smear of  cervix 05/25/2014   Hemoptysis 05/10/2013   Chest pain 01/11/2013   Anemia, unspecified 10/04/2012   Allergic rhinitis 03/10/2012   Irregular menses 05/30/2011   Irritable bowel syndrome with constipation 05/30/2011   Encounter for well adult exam with abnormal findings 05/28/2011   GERD 05/25/2010   Diaphragmatic hernia 05/21/2010   COLONIC POLYPS, HX OF 05/21/2010   Constipation 04/27/2010   Wheezing 12/16/2008   Anxiety 03/14/2008   Depression 03/14/2008   Attention deficit hyperactivity disorder (ADHD) 03/14/2008   SEIZURE DISORDER 02/13/2008   HYPERLIPIDEMIA 02/07/2008   BACK PAIN 02/07/2008    Past Surgical History:  Procedure Laterality Date   CESAREAN SECTION  x2  last one 02-22-2005   Bilateral Tubal Ligation w/ last one   COLONOSCOPY  last one 11-27-2015   LAPAROSCOPIC VAGINAL HYSTERECTOMY WITH SALPINGECTOMY Bilateral 02/01/2016   Procedure: LAPAROSCOPIC ASSISTED VAGINAL HYSTERECTOMY WITH SALPINGECTOMY;  Surgeon: Arvella Nigh, MD;  Location: [facility];  Service: Gynecology;  Laterality: Bilateral;   TONSILLECTOMY      OB History     Gravida  3   Para  3   Term      Preterm      AB      Living  3      SAB      IAB      Ectopic  Multiple      Live Births               Home Medications    Prior to Admission medications   Medication Sig Start Date End Date Taking? Authorizing Provider  ACAI BERRY PO Take 1 tablet by mouth daily.     [provider]  albuterol (VENTOLIN HFA) 108 (90 Base) MCG/ACT inhaler Inhale 2 puffs into the lungs every 4 (four) hours as needed for wheezing or shortness of breath.    [provider]  amphetamine-dextroamphetamine (ADDERALL) 5 MG tablet Take 1 tablet (5 mg total) by mouth daily as needed. Patient taking differently: Take 5 mg by mouth daily as needed (attention deficit). 08/14/19   Biagio Borg, MD  Ascorbic Acid (VITAMIN C) 100 MG tablet Take 100 mg by mouth daily.     [provider]  Cyanocobalamin (VITAMIN B 12 PO) Take 1 tablet by mouth daily.     [provider]  fluticasone (FLONASE) 50 MCG/ACT nasal spray Place 2 sprays into both nostrils daily for 14 days. 11/30/19 12/14/19  Tedd Sias, PA  linaclotide (LINZESS) 72 MCG capsule Take 1 capsule (72 mcg total) by mouth daily as needed. 08/14/19   Biagio Borg, MD  methylcellulose (CITRUCEL) oral powder Take 2 tablespoons in 8oz of water every day. 08/22/19   Cherene Altes, MD  methylphenidate (METADATE CD) 40 MG CR capsule Take 1 capsule (40 mg total) by mouth every morning. Patient taking differently: Take 40 mg by mouth daily as needed (attention deficit). 08/14/19   Biagio Borg, MD  Multiple Vitamins-Minerals (ALIVE WOMENS GUMMY PO) Take 1 tablet by mouth daily.    [provider]  pantoprazole (PROTONIX) 40 MG tablet Take 1 tablet (40 mg total) by mouth daily. 08/14/19   Biagio Borg, MD  polyethylene glycol (MIRALAX / GLYCOLAX) 17 g packet Take 17 g by mouth daily.    [provider]  promethazine-dextromethorphan (PROMETHAZINE-DM) 6.25-15 MG/5ML syrup Take 5 mLs by mouth 4 (four) times daily as needed for cough. 11/30/19   Tedd Sias, PA  valACYclovir (VALTREX) 500 MG tablet Take 1 tablet by mouth twice daily 08/19/19   Biagio Borg, MD  VITAMIN D PO Take 1 capsule by mouth daily.    [provider]    Family History Family History  Problem Relation Age of Onset   Stroke Father    Hypertension Father    Gout Father    Hyperlipidemia Father    Colon cancer Father    Asthma Mother    Alcohol abuse Other        grandfather   Breast cancer Other        grandmother   Hypertension Other        grandmother   Asthma Other        grandmother   Breast cancer Maternal Grandmother    Esophageal cancer Neg Hx    Stomach cancer Neg Hx    Rectal cancer Neg Hx     Social History Social History   Tobacco Use   Smoking status: Never    Smokeless tobacco: Never  Substance Use Topics   Alcohol use: Yes    Alcohol/week: 2.0 standard drinks of alcohol    Types: 2 Cans of beer per week   Drug use: No     Allergies   Latex, Adhesive [tape], and Hydrocodone bit-homatrop mbr   Review of Systems Review of Systems  Gastrointestinal:  Positive for abdominal pain.     Physical Exam Triage Vital Signs ED Triage Vitals [12/09/21 1104]  Enc Vitals Group     BP (!) 131/92     Pulse Rate 65     Resp 18     Temp 97.9 F (36.6 C)     Temp Source Oral     SpO2 99 %     Weight      Height      Head Circumference      Peak Flow      Pain Score      Pain Loc      Pain Edu?      Excl. in [place]?    No data found.  Updated Vital Signs BP (!) 131/92 (BP Location: Left Arm)   Pulse 65   Temp 97.9 F (36.6 C) (Oral)   Resp 18   LMP 01/20/2016 (Exact Date)   SpO2 99%   Visual Acuity Right Eye Distance:   Left Eye Distance:   Bilateral Distance:    Right Eye Near:   Left Eye Near:    Bilateral Near:     Physical Exam Vitals reviewed.  Constitutional:      General: She is not in acute distress.    Appearance: She is not ill-appearing, toxic-appearing or diaphoretic.  HENT:     Nose: Nose normal.     Mouth/Throat:     Mouth: Mucous membranes are moist.  Eyes:     Extraocular Movements: Extraocular movements intact.     Pupils: Pupils are equal, round, and reactive to light.  Cardiovascular:     Rate and Rhythm: Normal rate and regular rhythm.     Heart sounds: No murmur heard. Pulmonary:     Effort: Pulmonary effort is normal.     Breath sounds: Normal breath sounds.  Abdominal:     General: Bowel sounds are normal. There is no distension.     Palpations: Abdomen is soft. There is no mass.     Tenderness: There is abdominal tenderness (RLQ only). There is no guarding.  Musculoskeletal:     Cervical back: Neck supple.     Right lower leg: No edema.     Left lower leg: No edema.  Lymphadenopathy:      Cervical: No cervical adenopathy.  Skin:    Capillary Refill: Capillary refill takes less than 2 seconds.     Coloration: Skin is not jaundiced or pale.  Neurological:     Mental Status: She is alert and oriented to person, place, and time.  Psychiatric:        Behavior: Behavior normal.      UC Treatments / Results  Labs (all labs ordered are listed, but only abnormal results are displayed) Labs Reviewed  POCT URINALYSIS DIPSTICK, ED / UC    EKG   Radiology DG Abd 1 View  Result Date: 12/09/2021 CLINICAL DATA:  Right lower quadrant pain for 3-4 days. Left-sided groin pain for several days. EXAM: ABDOMEN - 1 VIEW COMPARISON:  CT abdomen pelvis 08/21/2019 FINDINGS: The bowel gas pattern is normal. No radio-opaque calculi at the expected locations of the kidneys or course of the ureters. Several pelvic phleboliths are noted. Mild fecal burden. IMPRESSION: Negative. Electronically Signed   By: Ileana Roup M.D.   On: 12/09/2021 12:04    Procedures Procedures (including critical care time)  Medications Ordered in UC Medications - No data to display  Initial Impression / Assessment and Plan /  UC Course  I have reviewed the triage vital signs and the nursing notes.  Pertinent labs & imaging results that were available during my care of the patient were reviewed by me and considered in my medical decision making (see chart for details).      x-ray does show a moderate amount of stool throughout her colon.  She is going to start using MiraLAX again and uses to collect suppository.  We discussed what to do if it worsens in any way and she will follow-up with her primary.  She is not currently taking Linzess   Urinalysis is negative   Final Clinical Impressions(s) / UC Diagnoses   Final diagnoses:  Right lower quadrant abdominal pain  Constipation, unspecified constipation type     Discharge Instructions      Start using MiraLAX over-the-counter according to the  package instructions.  Try using a Dulcolax suppository to get things started.  Let your primary care office know you have been seen for these issues.  Get seen in the emergency room if you worsen in any way     ED Prescriptions   None    PDMP not reviewed this encounter.   Barrett Henle, MD 12/09/21 1213    Barrett Henle, MD 12/09/21 1215

## 2021-12-09 NOTE — ED Triage Notes (Signed)
Pt reports left side pain groin pain for several days.  Denies any falls or trauma to the area

## 2022-06-24 ENCOUNTER — Telehealth: Payer: Self-pay | Admitting: Nurse Practitioner

## 2022-06-24 DIAGNOSIS — J0140 Acute pansinusitis, unspecified: Secondary | ICD-10-CM

## 2022-06-24 DIAGNOSIS — J4521 Mild intermittent asthma with (acute) exacerbation: Secondary | ICD-10-CM

## 2022-06-24 DIAGNOSIS — Z8619 Personal history of other infectious and parasitic diseases: Secondary | ICD-10-CM

## 2022-06-24 DIAGNOSIS — R051 Acute cough: Secondary | ICD-10-CM

## 2022-06-24 MED ORDER — VALACYCLOVIR HCL 500 MG PO TABS: 500.0000 mg | ORAL_TABLET | Freq: Two times a day (BID) | ORAL | 2 refills | Status: AC

## 2022-06-24 MED ORDER — ALBUTEROL SULFATE HFA 108 (90 BASE) MCG/ACT IN AERS: 2.0000 | INHALATION_SPRAY | Freq: Four times a day (QID) | RESPIRATORY_TRACT | 0 refills | Status: DC | PRN

## 2022-06-24 MED ORDER — AMOXICILLIN-POT CLAVULANATE 875-125 MG PO TABS: 1.0000 | ORAL_TABLET | Freq: Two times a day (BID) | ORAL | 0 refills | Status: AC

## 2022-06-24 MED ORDER — BENZONATATE 100 MG PO CAPS: 100.0000 mg | ORAL_CAPSULE | Freq: Three times a day (TID) | ORAL | 0 refills | Status: DC | PRN

## 2022-06-24 MED ORDER — IPRATROPIUM BROMIDE 0.03 % NA SOLN: 2.0000 | Freq: Two times a day (BID) | NASAL | 12 refills | Status: AC

## 2022-06-24 NOTE — Patient Instructions (Addendum)
Take antibiotic twice daily with food (amoxicillin)   Use Nyquil and Benzonatate at night for cough and congestion   Use nasal spray twice daily continue to use as needed   Benzonatate for cough is safe to use throughout the day should not cause sedation

## 2022-06-24 NOTE — Progress Notes (Signed)
Virtual Visit Consent   Kellie Curtis, you are scheduled for a virtual visit with a Kellie Curtis provider today. Just as with appointments in the office, your consent must be obtained to participate. Your consent will be active for this visit and any virtual visit you may have with one of our providers in the next 365 days. If you have a MyChart account, a copy of this consent can be sent to you electronically.  As this is a virtual visit, video technology does not allow for your provider to perform a traditional examination. This may limit your provider's ability to fully assess your condition. If your provider identifies any concerns that need to be evaluated in person or the need to arrange testing (such as labs, EKG, etc.), we will make arrangements to do so. Although advances in technology are sophisticated, we cannot ensure that it will always work on either your end or our end. If the connection with a video visit is poor, the visit may have to be switched to a telephone visit. With either a video or telephone visit, we are not always able to ensure that we have a secure connection.  By engaging in this virtual visit, you consent to the provision of healthcare and authorize for your insurance to be billed (if applicable) for the services provided during this visit. Depending on your insurance coverage, you may receive a charge related to this service.  I need to obtain your verbal consent now. Are you willing to proceed with your visit today? Vernica Curtis has provided verbal consent on 06/24/2022 for a virtual visit (video or telephone). Kellie Schneiders, FNP  Date: 06/24/2022 9:33 AM  Virtual Visit via Video Note   I, Kellie Curtis, connected with  Kellie Curtis  ([id number], [birth year]) on 06/24/22 at  9:30 AM EDT by a video-enabled telemedicine application and verified that I am speaking with the correct person using two identifiers.  Location: Patient: Virtual Visit Location Patient:  Home Provider: Virtual Visit Location Provider: Home Office   I discussed the limitations of evaluation and management by telemedicine and the availability of in person appointments. The patient expressed understanding and agreed to proceed.    History of Present Illness: Kellie Curtis is a 52 y.o. who identifies as a female who was assigned female at birth, and is being seen today for sinus congestion and a cough .  Symptom onset was last week without fever She did take a home COVID test and was negative   She has sinus congestion right ear pain and ST  Noted in the past 2 days her drainage has turned darker and green  She has a productive cough as well   She has tried multiple over the counter medications without relief   She has a history of Asthma and has an inhaler She has been using her Albuterol as needed feels her inhaler maybe out of date   Has a history of allergies as well  She also has a history of cold sores as well and that typically occurs when she is sick  Has used Valtrex in the past and was on suppressive therapy. She has run out of those refills but was using Valtrex daily - has onset of new sores on her lips with recent illness    Problems:  Patient Active Problem List   Diagnosis Date Noted   Sinobronchitis 12/02/2019   OSA (obstructive sleep apnea) 11/27/2019   Morbid obesity ([place]) 11/27/2019   Rectal bleeding 08/21/2019  Daytime somnolence 08/26/2017   Acute upper respiratory infection 03/16/2017   Urinary incontinence 03/16/2017   Influenza 06/24/2016   Cough 04/13/2016   Lymphadenitis 03/01/2016   S/P laparoscopic assisted vaginal hysterectomy (LAVH) 02/01/2016   Perimenopause 06/06/2014   Thickened endometrium 06/06/2014   Low grade squamous intraepithelial lesion (LGSIL) on Papanicolaou smear of cervix 05/25/2014   Hemoptysis 05/10/2013   Chest pain 01/11/2013   Anemia, unspecified 10/04/2012   Allergic rhinitis 03/10/2012   Irregular menses  05/30/2011   Irritable bowel syndrome with constipation 05/30/2011   Encounter for well adult exam with abnormal findings 05/28/2011   GERD 05/25/2010   Diaphragmatic hernia 05/21/2010   COLONIC POLYPS, HX OF 05/21/2010   Constipation 04/27/2010   Wheezing 12/16/2008   Anxiety 03/14/2008   Depression 03/14/2008   Attention deficit hyperactivity disorder (ADHD) 03/14/2008   SEIZURE DISORDER 02/13/2008   HYPERLIPIDEMIA 02/07/2008   BACK PAIN 02/07/2008    Allergies:  Allergies  Allergen Reactions   Latex Rash   Adhesive [Tape] Rash   Hydrocodone Bit-Homatrop Mbr Itching   Medications:  Current Outpatient Medications:    ACAI BERRY PO, Take 1 tablet by mouth daily. , Disp: , Rfl:    albuterol (VENTOLIN HFA) 108 (90 Base) MCG/ACT inhaler, Inhale 2 puffs into the lungs every 4 (four) hours as needed for wheezing or shortness of breath., Disp: , Rfl:    amphetamine-dextroamphetamine (ADDERALL) 5 MG tablet, Take 1 tablet (5 mg total) by mouth daily as needed. (Patient taking differently: Take 5 mg by mouth daily as needed (attention deficit).), Disp: 30 tablet, Rfl: 0   Ascorbic Acid (VITAMIN C) 100 MG tablet, Take 100 mg by mouth daily., Disp: , Rfl:    Cyanocobalamin (VITAMIN B 12 PO), Take 1 tablet by mouth daily. , Disp: , Rfl:    fluticasone (FLONASE) 50 MCG/ACT nasal spray, Place 2 sprays into both nostrils daily for 14 days., Disp: 11.1 mL, Rfl: 0   linaclotide (LINZESS) 72 MCG capsule, Take 1 capsule (72 mcg total) by mouth daily as needed., Disp: 30 capsule, Rfl: 5   methylcellulose (CITRUCEL) oral powder, Take 2 tablespoons in 8oz of water every day., Disp: , Rfl:    methylphenidate (METADATE CD) 40 MG CR capsule, Take 1 capsule (40 mg total) by mouth every morning. (Patient taking differently: Take 40 mg by mouth daily as needed (attention deficit).), Disp: 30 capsule, Rfl: 0   Multiple Vitamins-Minerals (ALIVE WOMENS GUMMY PO), Take 1 tablet by mouth daily., Disp: , Rfl:     pantoprazole (PROTONIX) 40 MG tablet, Take 1 tablet (40 mg total) by mouth daily., Disp: 90 tablet, Rfl: 3   polyethylene glycol (MIRALAX / GLYCOLAX) 17 g packet, Take 17 g by mouth daily., Disp: , Rfl:    promethazine-dextromethorphan (PROMETHAZINE-DM) 6.25-15 MG/5ML syrup, Take 5 mLs by mouth 4 (four) times daily as needed for cough., Disp: 118 mL, Rfl: 0   valACYclovir (VALTREX) 500 MG tablet, Take 1 tablet by mouth twice daily, Disp: 30 tablet, Rfl: 0   VITAMIN D PO, Take 1 capsule by mouth daily., Disp: , Rfl:   Observations/Objective: Patient is well-developed, well-nourished in no acute distress.  Resting comfortably  at home.  Head is normocephalic, atraumatic.  No labored breathing.  Speech is clear and coherent with logical content.  Patient is alert and oriented at baseline.    Assessment and Plan:  1. Acute non-recurrent pansinusitis  - amoxicillin-clavulanate (AUGMENTIN) 875-125 MG tablet; Take 1 tablet by mouth 2 (two) times daily  for 7 days. Take with food  Dispense: 14 tablet; Refill: 0 - ipratropium (ATROVENT) 0.03 % nasal spray; Place 2 sprays into both nostrils every 12 (twelve) hours.  Dispense: 30 mL; Refill: 12  2. H/O cold sores  - valACYclovir (VALTREX) 500 MG tablet; Take 1 tablet (500 mg total) by mouth 2 (two) times daily.  Dispense: 30 tablet; Refill: 2  3. Mild intermittent asthma with acute exacerbation  - albuterol (VENTOLIN HFA) 108 (90 Base) MCG/ACT inhaler; Inhale 2 puffs into the lungs every 6 (six) hours as needed for wheezing or shortness of breath.  Dispense: 8 g; Refill: 0  4. Acute cough  - benzonatate (TESSALON) 100 MG capsule; Take 1 capsule (100 mg total) by mouth 3 (three) times daily as needed.  Dispense: 30 capsule; Refill: 0     Follow up with PCP regarding need for other refills and annual physical   Follow Up Instructions: I discussed the assessment and treatment plan with the patient. The patient was provided an opportunity to  ask questions and all were answered. The patient agreed with the plan and demonstrated an understanding of the instructions.  A copy of instructions were sent to the patient via MyChart unless otherwise noted below.    The patient was advised to call back or seek an in-person evaluation if the symptoms worsen or if the condition fails to improve as anticipated.  Time:  I spent 20 minutes with the patient via telehealth technology discussing the above problems/concerns.    Kellie Schneiders, FNP

## 2024-03-25 ENCOUNTER — Ambulatory Visit (HOSPITAL_COMMUNITY): Admission: EM | Admit: 2024-03-25 | Discharge: 2024-03-25 | Disposition: A | Discharge status: Home / Self Care

## 2024-03-25 ENCOUNTER — Encounter (HOSPITAL_COMMUNITY): Payer: Self-pay

## 2024-03-25 ENCOUNTER — Ambulatory Visit (HOSPITAL_COMMUNITY)

## 2024-03-25 DIAGNOSIS — R051 Acute cough: Secondary | ICD-10-CM

## 2024-03-25 LAB — POC COVID19/FLU A&B COMBO
Covid Antigen, POC: NEGATIVE
Influenza A Antigen, POC: NEGATIVE
Influenza B Antigen, POC: NEGATIVE

## 2024-03-25 MED ORDER — IPRATROPIUM-ALBUTEROL 0.5-2.5 (3) MG/3ML IN SOLN
3.0000 mL | Freq: Once | RESPIRATORY_TRACT | Status: AC
  Administered 2024-03-25: 3 mL via RESPIRATORY_TRACT

## 2024-03-25 MED ORDER — IPRATROPIUM-ALBUTEROL 0.5-2.5 (3) MG/3ML IN SOLN
RESPIRATORY_TRACT | Status: AC
  Filled 2024-03-25: qty 3

## 2024-03-25 MED ORDER — PREDNISONE 20 MG PO TABS
ORAL_TABLET | ORAL | Status: AC
  Filled 2024-03-25: qty 1

## 2024-03-25 MED ORDER — ALBUTEROL SULFATE HFA 108 (90 BASE) MCG/ACT IN AERS: 1.0000 | INHALATION_SPRAY | Freq: Four times a day (QID) | RESPIRATORY_TRACT | 0 refills | Status: AC | PRN

## 2024-03-25 MED ORDER — ALBUTEROL SULFATE (2.5 MG/3ML) 0.083% IN NEBU: 2.5000 mg | INHALATION_SOLUTION | Freq: Four times a day (QID) | RESPIRATORY_TRACT | 12 refills | Status: AC | PRN

## 2024-03-25 MED ORDER — PREDNISONE 20 MG PO TABS
20.0000 mg | ORAL_TABLET | Freq: Once | ORAL | Status: AC
  Administered 2024-03-25: 20 mg via ORAL

## 2024-03-25 MED ORDER — PREDNISONE 20 MG PO TABS: 20.0000 mg | ORAL_TABLET | Freq: Every day | ORAL | 0 refills | Status: AC

## 2024-03-25 NOTE — ED Triage Notes (Signed)
 Patient here today with c/o cough, SOB, wheeze, hoarseness, chest soreness, and hot flashes X 8 days. Patient has been taking a cough syrup and an antibiotic but symptoms have not improved. 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

## 2024-03-25 NOTE — ED Provider Notes (Signed)
 " [facility]    CSN: [id number] Arrival date & time: 03/25/24  1027      History   Chief Complaint Chief Complaint  Patient presents with   Cough    HPI Kellie Curtis is a 53 y.o. female.   This 53 year old female is being seen for complaints of sore throat, shortness of breath with cough, occasional blood-tinged sputum, body aches.  She was seen at [facility] physicians clinic on 03/22/2024 for complaints of cough, sore throat.  Her symptoms have been present for 6 days.  She was given prescription for doxycycline and cough syrup.  She reports no improvement of symptoms.  She reports occasional blood-tinged sputum started yesterday.  She says it does not occur every time she coughs.  She reports scant amount of red blood, denies clots or copious amounts of bleeding.  Vital signs are stable at time of assessment.  She is nontoxic appearing.  She reports headache with coughing.  She denies dizziness, ear pain.  She denies chest pain.  She has discomfort in her abdomen from coughing, denies nausea, vomiting, diarrhea.   Cough Associated symptoms: chills, myalgias, shortness of breath and sore throat   Associated symptoms: no chest pain, no ear pain, no fever, no headaches, no rash and no rhinorrhea     Past Medical History:  Diagnosis Date   Abdominal pain, left lower quadrant    ADHD (attention deficit hyperactivity disorder)    Allergic rhinitis    Anemia    Anxiety state, unspecified    Asthma    Controlled   Depression    Dyslexia    Dysmenorrhea    Elevated blood pressure reading without diagnosis of hypertension    GERD (gastroesophageal reflux disease)    History of adenomatous polyp of colon    tubular adenoma's 2012;  2017   Hyperlipidemia    IBS (irritable bowel syndrome)    Menorrhagia    Other convulsions    Seizure disorder (HCC)    Uterus, adenomyosis     Patient Active Problem List   Diagnosis Date Noted   Sinobronchitis 12/02/2019   OSA  (obstructive sleep apnea) 11/27/2019   Morbid obesity (HCC) 11/27/2019   Rectal bleeding 08/21/2019   Daytime somnolence 08/26/2017   Acute upper respiratory infection 03/16/2017   Urinary incontinence 03/16/2017   Influenza 06/24/2016   Cough 04/13/2016   Lymphadenitis 03/01/2016   S/P laparoscopic assisted vaginal hysterectomy (LAVH) 02/01/2016   Perimenopause 06/06/2014   Thickened endometrium 06/06/2014   Low grade squamous intraepithelial lesion (LGSIL) on Papanicolaou smear of cervix 05/25/2014   Hemoptysis 05/10/2013   Chest pain 01/11/2013   Anemia, unspecified 10/04/2012   Allergic rhinitis 03/10/2012   Irregular menses 05/30/2011   Irritable bowel syndrome with constipation 05/30/2011   Encounter for well adult exam with abnormal findings 05/28/2011   GERD 05/25/2010   Diaphragmatic hernia 05/21/2010   History of colonic polyps 05/21/2010   Constipation 04/27/2010   Wheezing 12/16/2008   Anxiety 03/14/2008   Depression 03/14/2008   Attention deficit hyperactivity disorder (ADHD) 03/14/2008   SEIZURE DISORDER 02/13/2008   HYPERLIPIDEMIA 02/07/2008   Backache 02/07/2008    Past Surgical History:  Procedure Laterality Date   CESAREAN SECTION  x2  last one 02-22-2005   Bilateral Tubal Ligation w/ last one   COLONOSCOPY  last one 11-27-2015   LAPAROSCOPIC VAGINAL HYSTERECTOMY WITH SALPINGECTOMY Bilateral 02/01/2016   Procedure: LAPAROSCOPIC ASSISTED VAGINAL HYSTERECTOMY WITH SALPINGECTOMY;  Surgeon: Norleen Skill, MD;  Location:   SUR[facility]ERY [facility];  Service: Gynecology;  Laterality: Bilateral;   TONSILLECTOMY      OB History     Gravida  3   Para  3   Term      Preterm      AB      Living  3      SAB      IAB      Ectopic      Multiple      Live Births               Home Medications    Prior to Admission medications  Medication Sig Start Date End Date Taking? Authorizing Provider  albuterol  (PROVENTIL ) (2.5 MG/3ML) 0.083%  nebulizer solution Take 3 mLs (2.5 mg total) by nebulization every 6 (six) hours as needed for wheezing or shortness of breath. 03/25/24  Yes Lyrical Sowle C, FNP  albuterol  (VENTOLIN  HFA) 108 (90 Base) MCG/ACT inhaler Inhale 1-2 puffs into the lungs every 6 (six) hours as needed for wheezing or shortness of breath. 03/25/24  Yes Zarianna Dicarlo C, FNP  atorvastatin (LIPITOR) 20 MG tablet Take 20 mg by mouth daily. 01/26/24  Yes [provider]  predniSONE  (DELTASONE ) 20 MG tablet Take 1 tablet (20 mg total) by mouth daily with breakfast for 5 days. 03/25/24 03/30/24 Yes Kazi Reppond, Jon BROCKS, FNP  ACAI BERRY PO Take 1 tablet by mouth daily.     [provider]  amphetamine -dextroamphetamine  (ADDERALL) 5 MG tablet Take 1 tablet (5 mg total) by mouth daily as needed. Patient taking differently: Take 5 mg by mouth daily as needed (attention deficit). 08/14/19   Norleen Lynwood ORN, MD  Ascorbic Acid (VITAMIN C) 100 MG tablet Take 100 mg by mouth daily.    [provider]  Cyanocobalamin (VITAMIN B 12 PO) Take 1 tablet by mouth daily.     [provider]  doxycycline (VIBRA-TABS) 100 MG tablet Take 100 mg by mouth 2 (two) times daily.    [provider]  ipratropium (ATROVENT ) 0.03 % nasal spray Place 2 sprays into both nostrils every 12 (twelve) hours. 06/24/22   Kennyth Domino, FNP  linaclotide  (LINZESS ) 72 MCG capsule Take 1 capsule (72 mcg total) by mouth daily as needed. 08/14/19   Norleen Lynwood ORN, MD  methylphenidate  (METADATE  CD) 40 MG CR capsule Take 1 capsule (40 mg total) by mouth every morning. Patient taking differently: Take 40 mg by mouth daily as needed (attention deficit). 08/14/19   Norleen Lynwood ORN, MD  Multiple Vitamins-Minerals (ALIVE WOMENS GUMMY PO) Take 1 tablet by mouth daily.    [provider]  pantoprazole  (PROTONIX ) 40 MG tablet Take 1 tablet (40 mg total) by mouth daily. 08/14/19   Norleen Lynwood ORN, MD  polyethylene glycol (MIRALAX  / GLYCOLAX ) 17 g  packet Take 17 g by mouth daily.    [provider]  valACYclovir  (VALTREX ) 500 MG tablet Take 1 tablet (500 mg total) by mouth 2 (two) times daily. 06/24/22   Kennyth Domino, FNP  VITAMIN D PO Take 1 capsule by mouth daily.    [provider]    Family History Family History  Problem Relation Age of Onset   Stroke Father    Hypertension Father    Gout Father    Hyperlipidemia Father    Colon cancer Father    Asthma Mother    Alcohol abuse Other        grandfather   Breast cancer Other  grandmother   Hypertension Other        grandmother   Asthma Other        grandmother   Breast cancer Maternal Grandmother    Esophageal cancer Neg Hx    Stomach cancer Neg Hx    Rectal cancer Neg Hx     Social History Social History[1]   Allergies   Latex, Adhesive [tape], and Hydrocodone  bit-homatrop mbr   Review of Systems Review of Systems  Constitutional:  Positive for activity change and chills. Negative for appetite change and fever.  HENT:  Positive for sore throat. Negative for congestion, ear pain and rhinorrhea.   Eyes:  Negative for visual disturbance.  Respiratory:  Positive for cough and shortness of breath.   Cardiovascular:  Negative for chest pain.  Gastrointestinal:  Positive for abdominal pain. Negative for diarrhea, nausea and vomiting.  Musculoskeletal:  Positive for myalgias.  Skin:  Negative for color change and rash.  Neurological:  Negative for dizziness and headaches.  All other systems reviewed and are negative.    Physical Exam Triage Vital Signs ED Triage Vitals  Encounter Vitals Group     BP 03/25/24 1203 138/73     Girls Systolic BP Percentile --      Girls Diastolic BP Percentile --      Boys Systolic BP Percentile --      Boys Diastolic BP Percentile --      Pulse Rate 03/25/24 1203 65     Resp 03/25/24 1203 16     Temp 03/25/24 1203 98.7 F (37.1 C)     Temp Source 03/25/24 1203 Oral     SpO2 03/25/24 1203 97 %      Weight --      Height --      Head Circumference --      Peak Flow --      Pain Score 03/25/24 1158 2     Pain Loc --      Pain Education --      Exclude from Growth Chart --    No data found.  Updated Vital Signs BP 138/73 (BP Location: Left Arm)   Pulse 65   Temp 98.7 F (37.1 C) (Oral)   Resp 16   LMP 01/20/2016   SpO2 97%   Visual Acuity Right Eye Distance:   Left Eye Distance:   Bilateral Distance:    Right Eye Near:   Left Eye Near:    Bilateral Near:     Physical Exam Vitals and nursing note reviewed.  Constitutional:      General: She is awake. She is not in acute distress.    Appearance: She is well-developed. She is not toxic-appearing.     Comments: Pleasant female appearing stated age found sitting in chair in no acute distress.  HENT:     Head: Normocephalic and atraumatic.     Right Ear: Tympanic membrane and external ear normal.     Left Ear: Tympanic membrane and external ear normal.     Nose: Rhinorrhea present. No congestion.     Right Turbinates: Enlarged.     Left Turbinates: Enlarged.     Mouth/Throat:     Lips: Pink.     Mouth: Mucous membranes are moist.     Pharynx: No oropharyngeal exudate or posterior oropharyngeal erythema.  Eyes:     Conjunctiva/sclera: Conjunctivae normal.  Cardiovascular:     Rate and Rhythm: Normal rate and regular rhythm.     Heart sounds:  Normal heart sounds. No murmur heard. Pulmonary:     Effort: Pulmonary effort is normal. No tachypnea, bradypnea or respiratory distress.     Breath sounds: Wheezing present.  Abdominal:     General: Bowel sounds are normal.     Palpations: Abdomen is soft.     Tenderness: There is no abdominal tenderness.  Musculoskeletal:     Cervical back: Neck supple.  Lymphadenopathy:     Cervical: No cervical adenopathy.  Skin:    General: Skin is warm and dry.     Capillary Refill: Capillary refill takes less than 2 seconds.  Neurological:     Mental Status: She is alert.   Psychiatric:        Mood and Affect: Mood normal.        Behavior: Behavior is cooperative.      UC Treatments / Results  Labs (all labs ordered are listed, but only abnormal results are displayed) Labs Reviewed  POC COVID19/FLU A&B COMBO    EKG   Radiology DG Chest 2 View Result Date: 03/25/2024 EXAM: 2 VIEW(S) XRAY OF THE CHEST 03/25/2024 12:40:19 PM COMPARISON: None available. CLINICAL HISTORY: cough FINDINGS: LUNGS AND PLEURA: No focal pulmonary opacity. No pleural effusion. No pneumothorax. HEART AND MEDIASTINUM: No acute abnormality of the cardiac and mediastinal silhouettes. BONES AND SOFT TISSUES: No acute osseous abnormality. IMPRESSION: 1. No acute cardiopulmonary abnormality. Electronically signed by: Rogelia Myers MD 03/25/2024 01:36 PM EST RP Workstation: [id number]    Procedures Procedures (including critical care time)  Medications Ordered in UC Medications  predniSONE  (DELTASONE ) tablet 20 mg (has no administration in time range)  ipratropium-albuterol  (DUONEB) 0.5-2.5 (3) MG/3ML nebulizer solution 3 mL (3 mLs Nebulization Given 03/25/24 1226)    Initial Impression / Assessment and Plan / UC Course  I have reviewed the triage vital signs and the nursing notes.  Pertinent labs & imaging results that were available during my care of the patient were reviewed by me and considered in my medical decision making (see chart for details).     Vitals and triage reviewed, patient is hemodynamically stable.  Discussed low value of COVID/flu swab considering length of symptoms.  She request swab as she has contact with grandchildren.  She has significant wheezing.  She is administered DuoNeb in clinic.  Wheezing improved.  COVID/flu swab is negative.  Chest x-ray is negative.  Presentation consistent with bronchitis.  She is provided prescription for albuterol  nebulization, albuterol  inhaler.  She is given a prescription for a short course of steroids.  Plan of care,  follow-up care, return precautions given, no questions at this time. Final Clinical Impressions(s) / UC Diagnoses   Final diagnoses:  Acute cough     Discharge Instructions      You have been prescribed prednisone .  Take 1 tablet every morning for 5 days starting tomorrow. You have been prescribed albuterol  nebulizer solution.  Use every 6 hours as needed for wheezing or shortness of breath. You have been prescribed albuterol  inhaler.  Use every 6 hours as needed for wheezing or shortness of breath. Do not use nebulizer or inhaler together, use 1 or the other.  You can continue taking cough syrup previously prescribed at bedtime. Take guaifenesin during the day. Increase fluid intake.  If you develop new or worsening symptoms or if your symptoms do not start to improve please return here or follow-up with your primary care provider.  If your symptoms are severe, please go to the emergency room.  ED Prescriptions     Medication Sig Dispense Auth. Provider   albuterol  (VENTOLIN  HFA) 108 (90 Base) MCG/ACT inhaler Inhale 1-2 puffs into the lungs every 6 (six) hours as needed for wheezing or shortness of breath. 1 each Mattelyn Imhoff C, FNP   predniSONE  (DELTASONE ) 20 MG tablet Take 1 tablet (20 mg total) by mouth daily with breakfast for 5 days. 5 tablet Woodroe Vogan C, FNP   albuterol  (PROVENTIL ) (2.5 MG/3ML) 0.083% nebulizer solution Take 3 mLs (2.5 mg total) by nebulization every 6 (six) hours as needed for wheezing or shortness of breath. 75 mL Cinch Ormond C, FNP      PDMP not reviewed this encounter.    [1]  Social History Tobacco Use   Smoking status: Never   Smokeless tobacco: Never  Vaping Use   Vaping status: Never Used  Substance Use Topics   Alcohol use: Not Currently    Alcohol/week: 2.0 standard drinks of alcohol    Types: 2 Cans of beer per week   Drug use: No     Lennice Jon BROCKS, FNP 03/25/24 1403  "

## 2024-03-25 NOTE — Discharge Instructions (Signed)
 You have been prescribed prednisone .  Take 1 tablet every morning for 5 days starting tomorrow. You have been prescribed albuterol  nebulizer solution.  Use every 6 hours as needed for wheezing or shortness of breath. You have been prescribed albuterol  inhaler.  Use every 6 hours as needed for wheezing or shortness of breath. Do not use nebulizer or inhaler together, use 1 or the other.  You can continue taking cough syrup previously prescribed at bedtime. Take guaifenesin during the day. Increase fluid intake.  If you develop new or worsening symptoms or if your symptoms do not start to improve please return here or follow-up with your primary care provider.  If your symptoms are severe, please go to the emergency room.
